# Patient Record
Sex: Male | Born: 1985 | Race: Black or African American | Hispanic: No | Marital: Single | Smoking: Current every day smoker
Health system: Southern US, Community
[De-identification: ages and names within clinical notes are randomized; demographics above are authoritative.]

## PROBLEM LIST (undated history)

## (undated) DIAGNOSIS — F419 Anxiety disorder, unspecified: Secondary | ICD-10-CM

## (undated) DIAGNOSIS — F1910 Other psychoactive substance abuse, uncomplicated: Secondary | ICD-10-CM

## (undated) DIAGNOSIS — M795 Residual foreign body in soft tissue: Secondary | ICD-10-CM

## (undated) DIAGNOSIS — F329 Major depressive disorder, single episode, unspecified: Secondary | ICD-10-CM

## (undated) DIAGNOSIS — W3400XA Accidental discharge from unspecified firearms or gun, initial encounter: Secondary | ICD-10-CM

## (undated) DIAGNOSIS — F1010 Alcohol abuse, uncomplicated: Secondary | ICD-10-CM

## (undated) DIAGNOSIS — I10 Essential (primary) hypertension: Secondary | ICD-10-CM

## (undated) DIAGNOSIS — F32A Depression, unspecified: Secondary | ICD-10-CM

---

## 2011-09-16 ENCOUNTER — Emergency Department (HOSPITAL_COMMUNITY)
Admission: EM | Admit: 2011-09-16 | Discharge: 2011-09-16 | Disposition: A | Discharge status: Home / Self Care | Payer: Self-pay | Attending: Emergency Medicine | Admitting: Emergency Medicine

## 2011-09-16 ENCOUNTER — Emergency Department (HOSPITAL_COMMUNITY): Payer: Self-pay

## 2011-09-16 ENCOUNTER — Encounter (HOSPITAL_COMMUNITY): Payer: Self-pay | Admitting: *Deleted

## 2011-09-16 DIAGNOSIS — M79609 Pain in unspecified limb: Secondary | ICD-10-CM | POA: Insufficient documentation

## 2011-09-16 DIAGNOSIS — R1012 Left upper quadrant pain: Secondary | ICD-10-CM | POA: Insufficient documentation

## 2011-09-16 DIAGNOSIS — M79643 Pain in unspecified hand: Secondary | ICD-10-CM

## 2011-09-16 DIAGNOSIS — F17200 Nicotine dependence, unspecified, uncomplicated: Secondary | ICD-10-CM | POA: Insufficient documentation

## 2011-09-16 NOTE — ED Provider Notes (Signed)
History     CSN: [id number]  Arrival date & time 09/16/11  1017   First MD Initiated Contact with Patient 09/16/11 1031      Chief Complaint  Patient presents with  . Hand Pain  . Abdominal Pain    (Consider location/radiation/quality/duration/timing/severity/associated sxs/prior treatment) Patient is a 26 y.o. male presenting with hand pain and abdominal pain. The history is provided by the patient.  Hand Pain Chronicity: start 4 weeks ago. The current episode started 1 to 4 weeks ago. The problem occurs intermittently. The problem has been rapidly improving. Associated symptoms include abdominal pain. Pertinent negatives include no anorexia, arthralgias, change in bowel habit, chest pain, congestion, coughing, fatigue, fever, headaches, nausea, neck pain, numbness, rash, sore throat, vomiting or weakness. Exacerbated by: drinking alcohol aggrevates his abdominal pain. He has tried nothing for the symptoms. The treatment provided moderate relief.  Abdominal Pain The primary symptoms of the illness include abdominal pain. The primary symptoms of the illness do not include fever, fatigue, shortness of breath, nausea, vomiting, diarrhea or dysuria.  Symptoms associated with the illness do not include anorexia, constipation, urgency, hematuria, frequency or back pain.    History reviewed. No pertinent past medical history.  Past Surgical History  Procedure Date  . Hand surgery     History reviewed. No pertinent family history.  History  Substance Use Topics  . Smoking status: Current Everyday Smoker  . Smokeless tobacco: Not on file  . Alcohol Use: Yes      Review of Systems  Constitutional: Negative for fever, activity change, appetite change and fatigue.  HENT: Negative for congestion, sore throat, facial swelling, rhinorrhea, trouble swallowing, neck pain, neck stiffness, voice change and sinus pressure.   Eyes: Negative.   Respiratory: Negative for cough, choking, chest  tightness, shortness of breath and wheezing.   Cardiovascular: Negative for chest pain.  Gastrointestinal: Positive for abdominal pain. Negative for nausea, vomiting, diarrhea, constipation, blood in stool, abdominal distention, anal bleeding, anorexia and change in bowel habit.  Genitourinary: Negative for dysuria, urgency, frequency, hematuria, flank pain and difficulty urinating.  Musculoskeletal: Negative for back pain, arthralgias and gait problem.  Skin: Negative for rash and wound.  Neurological: Negative for facial asymmetry, weakness, numbness and headaches.  Psychiatric/Behavioral: Negative for behavioral problems, confusion and agitation. The patient is not nervous/anxious and is not hyperactive.   All other systems reviewed and are negative.    Allergies  Review of patient's allergies indicates no known allergies.  Home Medications  No current outpatient prescriptions on file.  BP 139/65  Pulse 52  Temp 97.7 F (36.5 C) (Oral)  Resp 14  Ht 5\' 11"  (1.803 m)  Wt 190 lb (86.183 kg)  BMI 26.50 kg/m2  SpO2 96%  Physical Exam  Nursing note and vitals reviewed. Constitutional: He is oriented to person, place, and time. He appears well-developed and well-nourished. No distress.  HENT:  Head: Normocephalic and atraumatic.  Right Ear: External ear normal.  Left Ear: External ear normal.  Mouth/Throat: No oropharyngeal exudate.  Eyes: Conjunctivae and EOM are normal. Pupils are equal, round, and reactive to light. Right eye exhibits no discharge. Left eye exhibits no discharge.  Neck: Normal range of motion. Neck supple. No JVD present. No tracheal deviation present. No thyromegaly present.  Cardiovascular: Normal rate, regular rhythm, normal heart sounds and intact distal pulses.  Exam reveals no gallop and no friction rub.   No murmur heard. Pulmonary/Chest: Effort normal and breath sounds normal. No respiratory distress. He  has no wheezes. He exhibits no tenderness.    Abdominal: Soft. Bowel sounds are normal. He exhibits no distension. There is no tenderness. There is no rigidity, no rebound, no guarding and negative Murphy's sign.  Musculoskeletal: Normal range of motion. He exhibits no edema and no tenderness.       Right wrist: He exhibits swelling. He exhibits normal range of motion, no tenderness, no bony tenderness, no effusion and no crepitus.       Patient with a 4 cm well healed scar to the dorsum of the right hand consistent with surgery to alleviate an infection along the extensor mechanism. There is a small amounts of swelling underneath the scar without heat erythema pain tenderness crepitus or other signs of inflammation. No bony tenderness whatsoever. No signs of cellulitis or infection.  Lymphadenopathy:    He has no cervical adenopathy.  Neurological: He is alert and oriented to person, place, and time. No cranial nerve deficit.  Skin: Skin is warm and dry. No rash noted. He is not diaphoretic. No pallor.  Psychiatric: He has a normal mood and affect. His behavior is normal.    ED Course  Procedures (including critical care time)  Labs Reviewed - No data to display No results found.   No diagnosis found.    MDM  26 year old male patient presents with concerns about his right hand. Patient is having no new symptoms of pain swelling redness heat or loss of function in his right hand. Patient says that he punched a tree 4 weeks ago and after that subsequently had signs of infection of his extensor mechanism of his right hand and subsequently underwent surgery. Since surgery patient has done well but did not take his antibiotics and he is concerned that that means that he may still have an infection and that is why he is here today. Patient was not able to follow up with his surgeon as a Careers adviser is in [place]. Here patient has no signs of infection of his right hand as detailed above in the physical exam. Patient without fevers nausea  vomiting. I feel that there is no sign of infection in his surgery as instructed the patient followup with the surgeon for reevaluation. Patient also complaining of abdominal pain in his left upper quadrant but does not have abdominal pain now. Patient says he has pain when he drinks alcohol but then goes away when he does not drink alcohol. Patient not nausea vomiting no diarrhea tolerating by mouth's afebrile vital signs stable abdomen without peritonitis or appreciable pain on exam. 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  DG Hand Complete Right (Final result)   Result time:09/16/11 1114    Final result by Rad Results In Interface (09/16/11 11:14:08)    Narrative:   *RADIOLOGY REPORT*  Clinical Data: History of surgery for infection 3-4 weeks ago. Soft tissue swelling about the metacarpals.  RIGHT HAND - COMPLETE 3+ VIEW  Comparison: None.  Findings: There is some soft tissue swelling about the hand. No radiopaque foreign body or soft tissue gas collection is identified. No bony destructive change, fracture or dislocation.  IMPRESSION: Soft tissue swelling without underlying soft tissue gas collection. No evidence of osteomyelitis.  Original Report Authenticated By: Bernadene Bell. Maricela Curet, M.D.         Imaging of the right hand shows swelling consistent with healing process visualized on physical exam. Patient encouraged followup with her original surgeon for repeat evaluation and  to cease alcohol consumption.  Case discussed with Dr.Beaton       Sherryl Manges, MD 09/16/11 1344

## 2011-09-16 NOTE — ED Notes (Signed)
Patient reports he hit a tree with his right hand and had some swelling noted approx 2.5 mths ago.  Patient states he had gross amount of swelling and he ended up having surgery on his hand due to infection approx 3 weeks ago.  Patient reports he was unable to get his meds filled due to cost.  He has returned today due to having concerns for infection.  Patient has noted healed suture line.  He has some swelling noted.  Patient admits to heavy drinking.  He reports he had onset of left sided mid to lower abd pain after drinking and he also has had onset of diarrhea.  Patient is no apparent distress at this time.  He is concerned about his liver as well

## 2011-09-16 NOTE — ED Provider Notes (Signed)
I saw and evaluated the patient, reviewed the resident's note and I agree with the findings and plan.   .Face to face Exam:  General:  Awake HEENT:  Atraumatic Resp:  Normal effort Abd:  Nondistended Neuro:No focal weakness Lymph: No adenopathy   Nelia Shi, MD 09/16/11 2320

## 2012-02-19 ENCOUNTER — Encounter (HOSPITAL_COMMUNITY): Payer: Self-pay | Admitting: Emergency Medicine

## 2012-02-19 ENCOUNTER — Emergency Department (HOSPITAL_COMMUNITY)
Admission: EM | Admit: 2012-02-19 | Discharge: 2012-02-19 | Disposition: A | Discharge status: Home / Self Care | Payer: Self-pay | Attending: Emergency Medicine | Admitting: Emergency Medicine

## 2012-02-19 DIAGNOSIS — F329 Major depressive disorder, single episode, unspecified: Secondary | ICD-10-CM | POA: Insufficient documentation

## 2012-02-19 DIAGNOSIS — F1010 Alcohol abuse, uncomplicated: Secondary | ICD-10-CM | POA: Insufficient documentation

## 2012-02-19 DIAGNOSIS — F17200 Nicotine dependence, unspecified, uncomplicated: Secondary | ICD-10-CM | POA: Insufficient documentation

## 2012-02-19 DIAGNOSIS — Z5900 Homelessness unspecified: Secondary | ICD-10-CM | POA: Insufficient documentation

## 2012-02-19 DIAGNOSIS — F1910 Other psychoactive substance abuse, uncomplicated: Secondary | ICD-10-CM | POA: Insufficient documentation

## 2012-02-19 DIAGNOSIS — R10819 Abdominal tenderness, unspecified site: Secondary | ICD-10-CM | POA: Insufficient documentation

## 2012-02-19 DIAGNOSIS — F3289 Other specified depressive episodes: Secondary | ICD-10-CM | POA: Insufficient documentation

## 2012-02-19 DIAGNOSIS — R1084 Generalized abdominal pain: Secondary | ICD-10-CM | POA: Insufficient documentation

## 2012-02-19 LAB — RAPID URINE DRUG SCREEN, HOSP PERFORMED
Amphetamines: NOT DETECTED
Benzodiazepines: NOT DETECTED
Opiates: NOT DETECTED
Tetrahydrocannabinol: NOT DETECTED

## 2012-02-19 LAB — CBC
MCHC: 35 g/dL (ref 30.0–36.0)
MCV: 84.4 fL (ref 78.0–100.0)
Platelets: 245 10*3/uL (ref 150–400)
RDW: 13.1 % (ref 11.5–15.5)
WBC: 10.5 10*3/uL (ref 4.0–10.5)

## 2012-02-19 LAB — ETHANOL: Alcohol, Ethyl (B): 96 mg/dL — ABNORMAL HIGH (ref 0–11)

## 2012-02-19 LAB — COMPREHENSIVE METABOLIC PANEL
AST: 27 U/L (ref 0–37)
Albumin: 4 g/dL (ref 3.5–5.2)
BUN: 8 mg/dL (ref 6–23)
Chloride: 97 mEq/L (ref 96–112)
Creatinine, Ser: 0.76 mg/dL (ref 0.50–1.35)
Potassium: 3.3 mEq/L — ABNORMAL LOW (ref 3.5–5.1)
Total Bilirubin: 0.4 mg/dL (ref 0.3–1.2)
Total Protein: 8 g/dL (ref 6.0–8.3)

## 2012-02-19 MED ORDER — ZOLPIDEM TARTRATE 5 MG PO TABS: 5.0000 mg | ORAL_TABLET | Freq: Every evening | ORAL | Status: DC | PRN

## 2012-02-19 MED ORDER — ALUM & MAG HYDROXIDE-SIMETH 200-200-20 MG/5ML PO SUSP: 30.0000 mL | ORAL | Status: DC | PRN

## 2012-02-19 MED ORDER — LORAZEPAM 2 MG/ML IJ SOLN: 1.0000 mg | Freq: Four times a day (QID) | INTRAMUSCULAR | Status: DC | PRN

## 2012-02-19 MED ORDER — NICOTINE 21 MG/24HR TD PT24: 21.0000 mg | MEDICATED_PATCH | Freq: Every day | TRANSDERMAL | Status: DC

## 2012-02-19 MED ORDER — LORAZEPAM 1 MG PO TABS: 1.0000 mg | ORAL_TABLET | Freq: Four times a day (QID) | ORAL | Status: DC | PRN

## 2012-02-19 MED ORDER — THIAMINE HCL 100 MG/ML IJ SOLN: 100.0000 mg | Freq: Every day | INTRAMUSCULAR | Status: DC

## 2012-02-19 MED ORDER — ONDANSETRON HCL 4 MG PO TABS: 4.0000 mg | ORAL_TABLET | Freq: Three times a day (TID) | ORAL | Status: DC | PRN

## 2012-02-19 MED ORDER — ACETAMINOPHEN 325 MG PO TABS: 650.0000 mg | ORAL_TABLET | ORAL | Status: DC | PRN

## 2012-02-19 MED ORDER — FOLIC ACID 1 MG PO TABS: 1.0000 mg | ORAL_TABLET | Freq: Every day | ORAL | Status: DC

## 2012-02-19 MED ORDER — VITAMIN B-1 100 MG PO TABS: 100.0000 mg | ORAL_TABLET | Freq: Every day | ORAL | Status: DC

## 2012-02-19 MED ORDER — ADULT MULTIVITAMIN W/MINERALS CH: 1.0000 | ORAL_TABLET | Freq: Every day | ORAL | Status: DC

## 2012-02-19 MED ORDER — POTASSIUM CHLORIDE CRYS ER 20 MEQ PO TBCR
40.0000 meq | EXTENDED_RELEASE_TABLET | Freq: Once | ORAL | Status: AC
  Administered 2012-02-19: 40 meq via ORAL
  Filled 2012-02-19: qty 2

## 2012-02-19 MED ORDER — IBUPROFEN 600 MG PO TABS: 600.0000 mg | ORAL_TABLET | Freq: Three times a day (TID) | ORAL | Status: DC | PRN

## 2012-02-19 NOTE — ED Notes (Signed)
Dr. Miller @ bedside.

## 2012-02-19 NOTE — ED Notes (Signed)
Pt. and belongings wanded by security 

## 2012-02-19 NOTE — BH Assessment (Signed)
Assessment Note   Adam [name] is an 26 y.o. male. Patient presents to [facility] via EMS. Sts that he called EDS for assistance after experiencing "cramps and pains on the sides of his stomach". Pt arrived stating that he is from the [place] area, he came here because he has a girlfriend here that he moved in with. Patient stating that after moving in with his girlfriend they begin having conflicts b/c she told patient that he drinks too much and smokes cracks occasionally. Patient using $200 worth of cocaine 1-2x's per week; pt uses $200 worth of cocaine during each use; last use was 02/19/2012. Patient also binge drinking daily; drinks a large amt of Sparks, beers, and Locos; last drink was 02/18/2012. Patient sts that his alcohol and drug use yesterday was a suicide attempt. Sts that he was hoping to get alcohol poisoning and/or overdose on the crack cocaine. He denies prior history of self harm. His girlfriend doesn't drink or do any drugs and wants pt to get treatment for his addictions. Pt wants help with depression and substance abuse. No prior inpatient admissions for mental illness or substance abuse.  Sts that he is motivated to change and doesn't want to be "like his mother". Patients mother is a crack cocaine abuser. Patients depression is triggered by watching his mothers suffer from a  crack addictions. He also recently learned that his mother is in the hospital after having a stroke. Pt believes his mom got a hold of some bad crack with rat poison in it and that's what made her have a stroke. Both his parents have had strokes, his dad mini strokes and he's ok but his mom had a major stroke and is only walking at 40% and they've been told she'll never talk again.  Pt reports feeling increasingly angry. Says he was kicked out of the [employer] for anger problems years ago.  He said he tried Lexapro once but it made him feel crazy so he stopped taking it all together. He also said he really doesn't like to  take medicines but willing to try anything to help himself get better.     Axis I: Major Depressive Disorder, Single Episode, without Psychotic Features; Alcohol Dependence; Cocaine Abuse Axis II: Deferred Axis III: History reviewed. No pertinent past medical history. Axis IV: economic problems, housing problems, other psychosocial or environmental problems, problems related to social environment, problems with access to health care services and problems with primary support group Axis V: 31-40 impairment in reality testing  Past Medical History: History reviewed. No pertinent past medical history.  Past Surgical History  Procedure Date  . Hand surgery     Family History: No family history on file.  Social History:  reports that he has been smoking.  He does not have any smokeless tobacco history on file. He reports that he drinks alcohol. He reports that he uses illicit drugs (Cocaine).  Additional Social History:  Alcohol / Drug Use Pain Medications: SEE MAR Prescriptions: SEE MAR Over the Counter: SEE MAR History of alcohol / drug use?: Yes Longest period of sobriety (when/how long): 1 day or less Negative Consequences of Use: Personal relationships Substance #1 Name of Substance 1: Cocaine 1 - Age of First Use: 26 yrs old 1 - Amount (size/oz): $200 1 - Frequency: 1-2x's per week 1 - Duration: ongoing for the past 2-4 yrs 1 - Last Use / Amount: 02/18/12; $200 Substance #2 Name of Substance 2: Alcohol 2 - Age of First Use:  26 yrs old 2 - Amount (size/oz): (4-12) Sparks, ("6-7 or sometimes more")40oz beers, 3-4 Locos 2 - Frequency: daily  2 - Duration: since age 89 2 - Last Use / Amount: 02/19/2012; alcohool binges  CIWA: CIWA-Ar BP: 147/89 mmHg Pulse Rate: 90  COWS:    Allergies: No Known Allergies  Home Medications:  (Not in a hospital admission)  OB/GYN Status:  No LMP for male patient.  General Assessment Data Location of Assessment: [facility] Living  Arrangements: Other (Comment);Spouse/significant other ([place]/ [place] Admission Status: Voluntary Is patient capable of signing voluntary admission?: Yes Transfer from: Acute Hospital Referral Source: Self/Family/Friend  Education Status Is patient currently in school?: No  Risk to self Suicidal Ideation: Yes-Currently Present Suicidal Intent: Yes-Currently Present Is patient at risk for suicide?: Yes Suicidal Plan?: Yes-Currently Present Specify Current Suicidal Plan:  (drink self to death or overdose on crack) Access to Means: Yes Specify Access to Suicidal Means:  (alcohol and crack cocaine) What has been your use of drugs/alcohol within the last 12 months?:  (alcohol and crack cocaine) Previous Attempts/Gestures: No How many times?:  (0) Other Self Harm Risks:  (none reported) Triggers for Past Attempts:  (no previous attempts noted) Intentional Self Injurious Behavior: None Family Suicide History: See progress notes (patients mother has a history of crack cocaine addiction) Recent stressful life event(s): Other (Comment);Trauma (Comment);Conflict (Comment) (mother and father have health issues; px's w/ Gfriend, SA) Persecutory voices/beliefs?: No Depression: Yes Depression Symptoms: Feeling angry/irritable;Feeling worthless/self pity;Loss of interest in usual pleasures;Guilt;Fatigue Substance abuse history and/or treatment for substance abuse?: No Suicide prevention information given to non-admitted patients: Not applicable  Risk to Others Homicidal Ideation: No Thoughts of Harm to Others: No Current Homicidal Intent: No Current Homicidal Plan: No Access to Homicidal Means: No Identified Victim:  (N/A) History of harm to others?: No Assessment of Violence: None Noted Violent Behavior Description:  (patient is calm and cooperative) Does patient have access to weapons?: No Criminal Charges Pending?: No Does patient have a  court date: No  Psychosis Hallucinations: None noted Delusions: None noted  Mental Status Report Appear/Hygiene: Other (Comment) (apprpropriate) Eye Contact: Poor Motor Activity: Freedom of movement Speech: Logical/coherent Level of Consciousness: Alert Mood: Depressed;Sad Affect: Appropriate to circumstance Anxiety Level: None Thought Processes: Coherent Judgement: Impaired Orientation: Place;Person;Time;Situation Obsessive Compulsive Thoughts/Behaviors: None  Cognitive Functioning Concentration: Decreased Memory: Recent Intact;Remote Intact IQ: Average Insight: Good Impulse Control: Good Appetite: Poor Weight Loss:  ("I don't eat ) Weight Gain:  (0) Sleep: Decreased Total Hours of Sleep:  (4-5 hrs at a time) Vegetative Symptoms: None  ADLScreening [facility] Assessment Services) Patient's cognitive ability adequate to safely complete daily activities?: Yes Patient able to express need for assistance with ADLs?: Yes Independently performs ADLs?: Yes (appropriate for developmental age)  Abuse/Neglect [facility]) Physical Abuse: Denies Verbal Abuse: Denies Sexual Abuse: Denies  Prior Inpatient Therapy Prior Inpatient Therapy: No Prior Therapy Dates:  (n/a) Prior Therapy Facilty/Provider(s):  (n/a) Reason for Treatment:  (n/a)  Prior Outpatient Therapy Prior Outpatient Therapy: No Prior Therapy Dates:  (n/a) Prior Therapy Facilty/Provider(s):  (n/a) Reason for Treatment:  (n/a)  ADL Screening (condition at time of admission) Patient's cognitive ability adequate to safely complete daily activities?: Yes Patient able to express need for assistance with ADLs?: Yes Independently performs ADLs?: Yes (appropriate for developmental age) Weakness of Legs: None Weakness of Arms/Hands: None  Home Assistive Devices/Equipment Home Assistive Devices/Equipment: None    Abuse/Neglect Assessment (Assessment  to be complete while patient is alone) Physical Abuse: Denies Verbal  Abuse: Denies Sexual Abuse: Denies Exploitation of patient/patient's resources: Denies Self-Neglect: Denies Values / Beliefs Cultural Requests During Hospitalization: None Spiritual Requests During Hospitalization: None   Advance Directives (For Healthcare) Advance Directive: Patient does not have advance directive Nutrition Screen- [facility] Adult/[facility] Patient's home diet: Regular  Additional Information 1:1 In Past 12 Months?: No CIRT Risk: No Elopement Risk: No Does patient have medical clearance?: No     Disposition:  Disposition Disposition of Patient: Inpatient treatment program  On Site Evaluation by:   Reviewed with Physician:     Octaviano Batty 02/19/2012 10:09 AM

## 2012-02-19 NOTE — ED Provider Notes (Addendum)
8:06 AM Filed Vitals:   02/19/12 0635  BP:   Pulse: 90  Temp: 97.7 F (36.5 C)  Resp: 20    Vague SI. ETOH abuse. Likely depression. Will ask telepsych to evaluate  Lyanne Co, MD 02/19/12 0807  10:55 AM The psychiatrist, Dr Rob Bunting MD, evaluated the patient lives the patient is safe for discharge home.  He recommends outpatient referrals to substance abuse specialist.  The patient is calm and cooperative.  No suicidal thoughts at this time.  Discharge home in good condition   Lyanne Co, MD 02/19/12 1056

## 2012-02-19 NOTE — ED Notes (Signed)
Pt is from the [place] area, he came here because he has a girlfriend here that he moved in with which he says they love eachother and he wants to marry her but he drinks too much and smokes cracks occasionally. His girlfriend doesn't drink or do any drugs and wants pt to get treatment for his addictions. Pt wants help with depression and substance abuse. He said he got kicked out of the [employer] for anger problems years ago. Both his parents have had strokes, his dad mini strokes and he's ok but his mom had a major stroke and is only walking at 40% and they've been told she'll never talk again. Pt believes mom got a hold of some bad crack with rat poison in it and that's what made her have a stroke. He says mom smoked crack for years including when she had her children. He said he tried Lexapro once but it made him feel crazy so he stopped taking it all together. He also said he really doesn't like to take medication but to depend on God. Explained to pt there would be a combination of talk therapy and drug therapy and he may not be on the anti-depressants for the rest of his life. Oriented to the unit.

## 2012-02-19 NOTE — ED Notes (Signed)
Pt alert, arrives from homeless, pt visiting from [place], pt states "drinking all day", presents c/o gen abd pain, pt ambulates to triage, resp even unlabored, skin pwd

## 2012-02-19 NOTE — ED Provider Notes (Signed)
History     CSN: [id number]  Arrival date & time 02/19/12  0406   First MD Initiated Contact with Patient 02/19/12 0550      Chief Complaint  Patient presents with  . Medical Clearance    (Consider location/radiation/quality/duration/timing/severity/associated sxs/prior treatment) HPI Comments: 26 year old male with a history of alcohol abuse who presents with the complaint of suicidal thoughts in excessive alcohol use. He states that over the last 6 months he has seen his mother have a massive stroke, his father's had 2 small strokes and he is having significant difficulties with his relationship with his girlfriend. He sites these events as the reasons that he is feeling suicidal. He notes that over the last few days he has been drinking alcohol so heavily that he has hardly had anything to eat and he has been drinking alcohol constantly including liquor and beer beverages, he is also endorses using crack several times over the last week. He denies any other drugs of abuse. He does note that he has mild tenderness to his right upper quadrant and left upper quadrant when he drinks heavily and feels that way at this time. He denies having tried to hurt himself in the past but feels like he has been drinking 2 "try to kill myself".  He denies hallucinations. He states that the symptoms of depression up and gradually worsening, persistent, nothing seems to make it better or worse. He denies being in psychiatric hospitals in the past but he does note that he has been in detox facilities.  The history is provided by the patient.    History reviewed. No pertinent past medical history.  Past Surgical History  Procedure Date  . Hand surgery     No family history on file.  History  Substance Use Topics  . Smoking status: Current Every Day Smoker  . Smokeless tobacco: Not on file  . Alcohol Use: Yes      Review of Systems  All other systems reviewed and are negative.    Allergies   Review of patient's allergies indicates no known allergies.  Home Medications  No current outpatient prescriptions on file.  BP 147/89  Pulse 101  Resp 19  SpO2 100%  Physical Exam  Nursing note and vitals reviewed. Constitutional: He appears well-developed and well-nourished. No distress.  HENT:  Head: Normocephalic and atraumatic.  Mouth/Throat: Oropharynx is clear and moist. No oropharyngeal exudate.  Eyes: Conjunctivae normal and EOM are normal. Pupils are equal, round, and reactive to light. Right eye exhibits no discharge. Left eye exhibits no discharge. No scleral icterus.  Neck: Normal range of motion. Neck supple. No JVD present. No thyromegaly present.  Cardiovascular: Normal rate, regular rhythm, normal heart sounds and intact distal pulses.  Exam reveals no gallop and no friction rub.   No murmur heard. Pulmonary/Chest: Effort normal and breath sounds normal. No respiratory distress. He has no wheezes. He has no rales.  Abdominal: Soft. Bowel sounds are normal. He exhibits no distension and no mass. There is no tenderness.       No reproducible tenderness to palpation and no hepatosplenomegaly  Musculoskeletal: Normal range of motion. He exhibits no edema and no tenderness.  Lymphadenopathy:    He has no cervical adenopathy.  Neurological: He is alert. Coordination normal.  Skin: Skin is warm and dry. No rash noted. No erythema.  Psychiatric:       Mildly depressed, no signs of significant anxiety, no hallucinations, passive suicidal thoughts.    ED  Course  Procedures (including critical care time)  Labs Reviewed  COMPREHENSIVE METABOLIC PANEL - Abnormal; Notable for the following:    Potassium 3.3 (*)     All other components within normal limits  [name] - Abnormal; Notable for the following:    Alcohol, Ethyl (B) 96 (*)     All other components within normal limits  CBC  URINE RAPID DRUG SCREEN (HOSP PERFORMED)   No results found.   1. Substance abuse    2. Suicidal ideation       MDM  The patient is depressed and has been drinking excessively to the end of self-harm, his alcohol level is less than 100 but given this pattern of significant substance abuse he would benefit from inpatient evaluation and treatment of his depression and substance abuse. He does not have a physical findings concerning for any concerning abnormalities, his liver function is normal, blood counts are normal, potassium is mildly low, this will be replaced with oral potassium. There is no behavioral health assessment team at this time, we will consult after 7 AM.  Change of shift - care signed out to oncoming physician.        Vida Roller, MD 02/19/12 [phone]

## 2012-02-19 NOTE — ED Notes (Signed)
ACT in w/ pt 

## 2012-02-19 NOTE — ED Notes (Signed)
Pt calls this nurse into room, states "i drink to try and kill myself"

## 2012-05-14 ENCOUNTER — Emergency Department (HOSPITAL_COMMUNITY)
Admission: EM | Admit: 2012-05-14 | Discharge: 2012-05-14 | Disposition: A | Discharge status: Home / Self Care | Payer: Self-pay | Attending: Emergency Medicine | Admitting: Emergency Medicine

## 2012-05-14 ENCOUNTER — Encounter (HOSPITAL_COMMUNITY): Payer: Self-pay | Admitting: Emergency Medicine

## 2012-05-14 DIAGNOSIS — B353 Tinea pedis: Secondary | ICD-10-CM | POA: Insufficient documentation

## 2012-05-14 DIAGNOSIS — L299 Pruritus, unspecified: Secondary | ICD-10-CM | POA: Insufficient documentation

## 2012-05-14 DIAGNOSIS — F17200 Nicotine dependence, unspecified, uncomplicated: Secondary | ICD-10-CM | POA: Insufficient documentation

## 2012-05-14 MED ORDER — CLOTRIMAZOLE 1 % EX CREA: TOPICAL_CREAM | Freq: Two times a day (BID) | CUTANEOUS | Status: DC

## 2012-05-14 NOTE — ED Provider Notes (Signed)
History     CSN: [id number]  Arrival date & time 05/14/12  1228   First MD Initiated Contact with Patient 05/14/12 1337      Chief Complaint  Patient presents with  . Toe Pain    (Consider location/radiation/quality/duration/timing/severity/associated sxs/prior treatment) HPI Comments: Patient reports that he has had pain in between his 4th and 5th toes on both the right and the left foot for the past 8-9 months, which is gradually worsening.  He describes the pain as a burning pain and also reports itching.  He recently got out of jail.  He reports that the area is moist and that he has noticed a whitish color discharge in between his toes.  He has not had any treatment prior to arrival in the ED.  He denies numbness or tingling.  Denies fever or chills.  No erythema or swelling of the toes.  Patient is a 27 y.o. male presenting with toe pain. The history is provided by the patient.  Toe Pain Pertinent negatives include no chills or fever.    History reviewed. No pertinent past medical history.  Past Surgical History  Procedure Laterality Date  . Hand surgery      No family history on file.  History  Substance Use Topics  . Smoking status: Current Every Day Smoker  . Smokeless tobacco: Not on file  . Alcohol Use: Yes      Review of Systems  Constitutional: Negative for fever and chills.  Skin:       Discharge between toes on right and left foot    Allergies  Review of patient's allergies indicates no known allergies.  Home Medications  No current outpatient prescriptions on file.  BP 136/88  Pulse 85  Temp(Src) 97.6 F (36.4 C) (Oral)  Resp 18  SpO2 96%  Physical Exam  Nursing note and vitals reviewed. Constitutional: He appears well-developed and well-nourished. No distress.  HENT:  Head: Normocephalic and atraumatic.  Mouth/Throat: Oropharynx is clear and moist.  Neck: Normal range of motion. Neck supple.  Cardiovascular: Normal rate, regular rhythm  and normal heart sounds.   Pulmonary/Chest: Effort normal and breath sounds normal.  Neurological: He is alert. No sensory deficit.  Skin: Skin is warm and dry. He is not diaphoretic.  Moist whitish colored discharge in between the 4th and 5th toes on both the right and the left foot.  Skin appears to be peeling.   No erythema or swelling of the toes.  Psychiatric: He has a normal mood and affect.    ED Course  Procedures (including critical care time)  Labs Reviewed - No data to display No results found.   No diagnosis found.    MDM  Patient with Tinea Pedis infection of both the left and right foot.  Patient given prescription for antifungal and discharged home.        Pascal Lux [place], PA-C 05/15/12 1759

## 2012-05-14 NOTE — ED Notes (Signed)
Pt c/o pain between pinky toe and 4th toe noted today. Pt reports usually area has white color for 5-6 months to it but today noticed the green color.

## 2012-05-16 ENCOUNTER — Emergency Department (HOSPITAL_COMMUNITY)
Admission: EM | Admit: 2012-05-16 | Discharge: 2012-05-16 | Disposition: A | Discharge status: Home / Self Care | Payer: Self-pay | Attending: Emergency Medicine | Admitting: Emergency Medicine

## 2012-05-16 ENCOUNTER — Encounter (HOSPITAL_COMMUNITY): Payer: Self-pay | Admitting: Cardiology

## 2012-05-16 DIAGNOSIS — K2970 Gastritis, unspecified, without bleeding: Secondary | ICD-10-CM | POA: Insufficient documentation

## 2012-05-16 DIAGNOSIS — R1011 Right upper quadrant pain: Secondary | ICD-10-CM | POA: Insufficient documentation

## 2012-05-16 DIAGNOSIS — F17200 Nicotine dependence, unspecified, uncomplicated: Secondary | ICD-10-CM | POA: Insufficient documentation

## 2012-05-16 DIAGNOSIS — R1110 Vomiting, unspecified: Secondary | ICD-10-CM | POA: Insufficient documentation

## 2012-05-16 LAB — COMPREHENSIVE METABOLIC PANEL
ALT: 27 U/L (ref 0–53)
AST: 30 U/L (ref 0–37)
Albumin: 3.3 g/dL — ABNORMAL LOW (ref 3.5–5.2)
CO2: 23 mEq/L (ref 19–32)
Calcium: 9 mg/dL (ref 8.4–10.5)
Chloride: 101 mEq/L (ref 96–112)
Creatinine, Ser: 0.61 mg/dL (ref 0.50–1.35)
Sodium: 138 mEq/L (ref 135–145)

## 2012-05-16 LAB — CBC WITH DIFFERENTIAL/PLATELET
Basophils Absolute: 0 10*3/uL (ref 0.0–0.1)
Basophils Relative: 0 % (ref 0–1)
Eosinophils Relative: 1 % (ref 0–5)
HCT: 45.9 % (ref 39.0–52.0)
Lymphocytes Relative: 22 % (ref 12–46)
MCHC: 35.5 g/dL (ref 30.0–36.0)
Monocytes Absolute: 1 10*3/uL (ref 0.1–1.0)
Neutro Abs: 6.9 10*3/uL (ref 1.7–7.7)
Platelets: 245 10*3/uL (ref 150–400)
RDW: 14.9 % (ref 11.5–15.5)
WBC: 10.3 10*3/uL (ref 4.0–10.5)

## 2012-05-16 LAB — OCCULT BLOOD, POC DEVICE: Fecal Occult Bld: NEGATIVE

## 2012-05-16 MED ORDER — OMEPRAZOLE 20 MG PO CPDR: 20.0000 mg | DELAYED_RELEASE_CAPSULE | Freq: Every day | ORAL | Status: DC

## 2012-05-16 MED ORDER — MORPHINE SULFATE 4 MG/ML IJ SOLN
4.0000 mg | Freq: Once | INTRAMUSCULAR | Status: AC
  Administered 2012-05-16: 4 mg via INTRAVENOUS
  Filled 2012-05-16: qty 1

## 2012-05-16 MED ORDER — FAMOTIDINE IN NACL 20-0.9 MG/50ML-% IV SOLN
20.0000 mg | Freq: Once | INTRAVENOUS | Status: AC
  Administered 2012-05-16: 20 mg via INTRAVENOUS
  Filled 2012-05-16: qty 50

## 2012-05-16 MED ORDER — SODIUM CHLORIDE 0.9 % IV BOLUS (SEPSIS)
1000.0000 mL | Freq: Once | INTRAVENOUS | Status: AC
  Administered 2012-05-16: 1000 mL via INTRAVENOUS

## 2012-05-16 MED ORDER — VITAMIN B-1 100 MG PO TABS
50.0000 mg | ORAL_TABLET | Freq: Once | ORAL | Status: AC
  Administered 2012-05-16: 50 mg via ORAL
  Filled 2012-05-16: qty 1
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
## 2012-05-16 NOTE — ED Provider Notes (Signed)
Medical screening examination/treatment/procedure(s) were performed by non-physician practitioner and as supervising physician I was immediately available for consultation/collaboration.   Laray Anger, DO 05/16/12 1235

## 2012-05-16 NOTE — ED Provider Notes (Signed)
History     CSN: [id number]  Arrival date & time 05/16/12  0902   First MD Initiated Contact with Patient 05/16/12 [phone]      Chief Complaint  Patient presents with  . Back Pain    (Consider location/radiation/quality/duration/timing/severity/associated sxs/prior treatment) HPI Comments: Pt comes in with cc of abd pain and back pain. He admits to daily drinking. States that he has been drinking heavily over the past few days as he was kicked out by his wife. Pt started having abd pain - RLQ around 5 am along with upper lumbar spine pain. He has had similar pain with drinking before, so he stopped drinking, and proceeded to come to the ED. He has no nausea, but admits to 1 episode of emesis, blood tinged. No hx of liver dz, no bloody stools. No EGD done at any point. Pt has no associated numbness, weakness, urinary incontinence, urinary retention, bowel incontinence, saddle anesthesia. No hx of gall stone, renal stones.   Patient is a 27 y.o. male presenting with back pain. The history is provided by the patient.  Back Pain Associated symptoms: abdominal pain   Associated symptoms: no chest pain, no dysuria, no fever and no headaches     History reviewed. No pertinent past medical history.  Past Surgical History  Procedure Laterality Date  . Hand surgery      History reviewed. No pertinent family history.  History  Substance Use Topics  . Smoking status: Current Every Day Smoker  . Smokeless tobacco: Not on file  . Alcohol Use: Yes      Review of Systems  Constitutional: Negative for fever, chills and activity change.  HENT: Negative for neck pain.   Eyes: Negative for visual disturbance.  Respiratory: Negative for cough, chest tightness and shortness of breath.   Cardiovascular: Negative for chest pain.  Gastrointestinal: Positive for vomiting and abdominal pain. Negative for nausea, diarrhea, constipation, blood in stool and abdominal distention.  Genitourinary:  Negative for dysuria, enuresis and difficulty urinating.  Musculoskeletal: Positive for back pain. Negative for arthralgias.  Neurological: Negative for dizziness, light-headedness and headaches.  Psychiatric/Behavioral: Negative for confusion.    Allergies  Review of patient's allergies indicates no known allergies.  Home Medications   Current Outpatient Rx  Name  Route  Sig  Dispense  Refill  . clotrimazole (LOTRIMIN AF) 1 % cream   Topical   Apply topically 2 (two) times daily.   30 g   0     BP 115/72  Pulse 92  Temp(Src) 97.7 F (36.5 C) (Oral)  Resp 18  SpO2 98%  Physical Exam  Nursing note and vitals reviewed. Constitutional: He is oriented to person, place, and time. He appears well-developed.  HENT:  Head: Normocephalic and atraumatic.  Eyes: Conjunctivae and EOM are normal. Pupils are equal, round, and reactive to light.  Neck: Normal range of motion. Neck supple.  Cardiovascular: Normal rate, regular rhythm and normal heart sounds.   Pulmonary/Chest: Effort normal and breath sounds normal. No respiratory distress. He has no wheezes.  Abdominal: Soft. Bowel sounds are normal. He exhibits no distension. There is no tenderness. There is no rebound and no guarding.  guaiac negative stools  Musculoskeletal:  Pt has tenderness over the upper lumbar region No step offs, no erythema. Pt has 2+ patellar reflex bilaterally. Able to discriminate between sharp and dull. Able to ambulate  Neurological: He is alert and oriented to person, place, and time.  Skin: Skin is warm.  ED Course  Procedures (including critical care time)  Labs Reviewed  CBC WITH DIFFERENTIAL  COMPREHENSIVE METABOLIC PANEL  LIPASE, BLOOD  OCCULT BLOOD, POC DEVICE   No results found.   No diagnosis found.    MDM  DDx includes: Pancreatitis Hepatobiliary pathology including cholecystitis Gastritis/PUD Aortic Dissection   Pt comes in with cc of abd pain. Pt also has some  back pain. The pais is RUQ - mostly lateral, no murphy's. Very low probability of gall stones. No CVA tenderness - and concerns for renal stones really low. DDx is mostly PUD vs. Pancreatitis. We will get basic screening labs to start. Guaiac neg stools - will get screening cbc.  Derwood Kaplan, MD 05/16/12 1019

## 2012-05-16 NOTE — ED Notes (Signed)
Pt to department via EMS- pt reports he has been drinking last night, approximately 9-10 40's. States he is concerned that he has alcohol poisoning. Pt also reports toe pain that he received a Rx for but did not have it filled.

## 2012-05-31 ENCOUNTER — Encounter (HOSPITAL_COMMUNITY): Payer: Self-pay | Admitting: *Deleted

## 2012-05-31 ENCOUNTER — Emergency Department (HOSPITAL_COMMUNITY)
Admission: EM | Admit: 2012-05-31 | Discharge: 2012-05-31 | Disposition: A | Discharge status: Other Acute Inpatient Hospital | Payer: Self-pay | Attending: Emergency Medicine | Admitting: Emergency Medicine

## 2012-05-31 ENCOUNTER — Encounter (HOSPITAL_COMMUNITY): Payer: Self-pay | Admitting: Emergency Medicine

## 2012-05-31 ENCOUNTER — Inpatient Hospital Stay (HOSPITAL_COMMUNITY)
Admission: AD | Admit: 2012-05-31 | Discharge: 2012-06-02 | DRG: 897 | Disposition: A | Discharge status: Home / Self Care | Payer: Federal, State, Local not specified - Other | Source: Intra-hospital | Attending: Psychiatry | Admitting: Psychiatry

## 2012-05-31 DIAGNOSIS — F1410 Cocaine abuse, uncomplicated: Secondary | ICD-10-CM | POA: Diagnosis present

## 2012-05-31 DIAGNOSIS — Z79899 Other long term (current) drug therapy: Secondary | ICD-10-CM

## 2012-05-31 DIAGNOSIS — F1010 Alcohol abuse, uncomplicated: Secondary | ICD-10-CM | POA: Insufficient documentation

## 2012-05-31 DIAGNOSIS — F1020 Alcohol dependence, uncomplicated: Principal | ICD-10-CM | POA: Diagnosis present

## 2012-05-31 DIAGNOSIS — M79609 Pain in unspecified limb: Secondary | ICD-10-CM | POA: Insufficient documentation

## 2012-05-31 DIAGNOSIS — Z591 Inadequate housing, unspecified: Secondary | ICD-10-CM | POA: Insufficient documentation

## 2012-05-31 DIAGNOSIS — K219 Gastro-esophageal reflux disease without esophagitis: Secondary | ICD-10-CM | POA: Diagnosis present

## 2012-05-31 DIAGNOSIS — F1994 Other psychoactive substance use, unspecified with psychoactive substance-induced mood disorder: Secondary | ICD-10-CM | POA: Diagnosis present

## 2012-05-31 DIAGNOSIS — G8929 Other chronic pain: Secondary | ICD-10-CM | POA: Insufficient documentation

## 2012-05-31 DIAGNOSIS — M549 Dorsalgia, unspecified: Secondary | ICD-10-CM | POA: Insufficient documentation

## 2012-05-31 DIAGNOSIS — F1910 Other psychoactive substance abuse, uncomplicated: Secondary | ICD-10-CM | POA: Insufficient documentation

## 2012-05-31 DIAGNOSIS — R45851 Suicidal ideations: Secondary | ICD-10-CM | POA: Insufficient documentation

## 2012-05-31 DIAGNOSIS — F17200 Nicotine dependence, unspecified, uncomplicated: Secondary | ICD-10-CM | POA: Insufficient documentation

## 2012-05-31 DIAGNOSIS — F3289 Other specified depressive episodes: Secondary | ICD-10-CM | POA: Insufficient documentation

## 2012-05-31 DIAGNOSIS — Z8679 Personal history of other diseases of the circulatory system: Secondary | ICD-10-CM | POA: Insufficient documentation

## 2012-05-31 DIAGNOSIS — F329 Major depressive disorder, single episode, unspecified: Secondary | ICD-10-CM | POA: Insufficient documentation

## 2012-05-31 LAB — COMPREHENSIVE METABOLIC PANEL
AST: 18 U/L (ref 0–37)
BUN: 7 mg/dL (ref 6–23)
CO2: 23 mEq/L (ref 19–32)
Chloride: 99 mEq/L (ref 96–112)
Creatinine, Ser: 0.75 mg/dL (ref 0.50–1.35)
GFR calc Af Amer: >90 mL/min (ref >90)
GFR calc non Af Amer: >90 mL/min (ref >90)
Glucose, Bld: 92 mg/dL (ref 70–99)
Total Bilirubin: 0.4 mg/dL (ref 0.3–1.2)

## 2012-05-31 LAB — CBC WITH DIFFERENTIAL/PLATELET
Basophils Absolute: 0 10*3/uL (ref 0.0–0.1)
Eosinophils Relative: 1 % (ref 0–5)
HCT: 45.7 % (ref 39.0–52.0)
Hemoglobin: 16 g/dL (ref 13.0–17.0)
Lymphocytes Relative: 20 % (ref 12–46)
Lymphs Abs: 1.9 10*3/uL (ref 0.7–4.0)
MCV: 86.7 fL (ref 78.0–100.0)
Monocytes Absolute: 0.9 10*3/uL (ref 0.1–1.0)
Monocytes Relative: 10 % (ref 3–12)
Neutro Abs: 6.4 10*3/uL (ref 1.7–7.7)
RBC: 5.27 MIL/uL (ref 4.22–5.81)
WBC: 9.2 10*3/uL (ref 4.0–10.5)

## 2012-05-31 LAB — ACETAMINOPHEN LEVEL: Acetaminophen (Tylenol), Serum: <15 ug/mL (ref 10–30)

## 2012-05-31 LAB — GLUCOSE, CAPILLARY: Glucose-Capillary: 96 mg/dL (ref 70–99)

## 2012-05-31 LAB — RAPID URINE DRUG SCREEN, HOSP PERFORMED
Barbiturates: NOT DETECTED
Benzodiazepines: NOT DETECTED

## 2012-05-31 MED ORDER — VITAMIN B-1 100 MG PO TABS
100.0000 mg | ORAL_TABLET | Freq: Every day | ORAL | Status: DC
  Administered 2012-06-01 – 2012-06-02 (×2): 100 mg via ORAL
  Filled 2012-05-31 (×3): qty 1

## 2012-05-31 MED ORDER — HYDROXYZINE HCL 25 MG PO TABS: 25.0000 mg | ORAL_TABLET | Freq: Four times a day (QID) | ORAL | Status: DC | PRN

## 2012-05-31 MED ORDER — ONDANSETRON 4 MG PO TBDP: 4.0000 mg | ORAL_TABLET | Freq: Four times a day (QID) | ORAL | Status: DC | PRN

## 2012-05-31 MED ORDER — INFLUENZA VIRUS VACC SPLIT PF IM SUSP
0.5000 mL | INTRAMUSCULAR | Status: AC
  Administered 2012-06-01: 0.5 mL via INTRAMUSCULAR

## 2012-05-31 MED ORDER — MAGNESIUM HYDROXIDE 400 MG/5ML PO SUSP
30.0000 mL | Freq: Every day | ORAL | Status: DC | PRN
Start: 2012-05-31 — End: 2012-06-02

## 2012-05-31 MED ORDER — NICOTINE 14 MG/24HR TD PT24
14.0000 mg | MEDICATED_PATCH | Freq: Every day | TRANSDERMAL | Status: DC
  Filled 2012-05-31 (×2): qty 1

## 2012-05-31 MED ORDER — PNEUMOCOCCAL VAC POLYVALENT 25 MCG/0.5ML IJ INJ
0.5000 mL | INJECTION | INTRAMUSCULAR | Status: AC
  Administered 2012-06-01: 0.5 mL via INTRAMUSCULAR

## 2012-05-31 MED ORDER — IBUPROFEN 200 MG PO TABS: 600.0000 mg | ORAL_TABLET | Freq: Three times a day (TID) | ORAL | Status: DC | PRN

## 2012-05-31 MED ORDER — ACETAMINOPHEN 325 MG PO TABS: 650.0000 mg | ORAL_TABLET | ORAL | Status: DC | PRN

## 2012-05-31 MED ORDER — CHLORDIAZEPOXIDE HCL 25 MG PO CAPS: 25.0000 mg | ORAL_CAPSULE | Freq: Four times a day (QID) | ORAL | Status: DC | PRN

## 2012-05-31 MED ORDER — ALUM & MAG HYDROXIDE-SIMETH 200-200-20 MG/5ML PO SUSP: 30.0000 mL | ORAL | Status: DC | PRN

## 2012-05-31 MED ORDER — CHLORDIAZEPOXIDE HCL 25 MG PO CAPS
25.0000 mg | ORAL_CAPSULE | Freq: Four times a day (QID) | ORAL | Status: AC
  Administered 2012-05-31 – 2012-06-02 (×6): 25 mg via ORAL
  Filled 2012-05-31 (×6): qty 1

## 2012-05-31 MED ORDER — CHLORDIAZEPOXIDE HCL 25 MG PO CAPS
25.0000 mg | ORAL_CAPSULE | Freq: Three times a day (TID) | ORAL | Status: DC
  Filled 2012-05-31: qty 1

## 2012-05-31 MED ORDER — NICOTINE 21 MG/24HR TD PT24: 21.0000 mg | MEDICATED_PATCH | Freq: Every day | TRANSDERMAL | Status: DC

## 2012-05-31 MED ORDER — PANTOPRAZOLE SODIUM 40 MG PO TBEC
40.0000 mg | DELAYED_RELEASE_TABLET | Freq: Every day | ORAL | Status: DC
  Administered 2012-06-01 – 2012-06-02 (×2): 40 mg via ORAL
  Filled 2012-05-31 (×4): qty 1

## 2012-05-31 MED ORDER — TRAZODONE HCL 50 MG PO TABS
50.0000 mg | ORAL_TABLET | Freq: Every evening | ORAL | Status: DC | PRN
  Administered 2012-05-31 – 2012-06-01 (×2): 50 mg via ORAL
  Filled 2012-05-31 (×6): qty 1
  Filled 2012-05-31: qty 28
  Filled 2012-05-31: qty 1
  Filled 2012-05-31: qty 28

## 2012-05-31 MED ORDER — ACETAMINOPHEN 325 MG PO TABS: 650.0000 mg | ORAL_TABLET | Freq: Four times a day (QID) | ORAL | Status: DC | PRN

## 2012-05-31 MED ORDER — ADULT MULTIVITAMIN W/MINERALS CH
1.0000 | ORAL_TABLET | Freq: Every day | ORAL | Status: DC
  Administered 2012-06-01 – 2012-06-02 (×2): 1 via ORAL
  Filled 2012-05-31 (×3): qty 1

## 2012-05-31 MED ORDER — CHLORDIAZEPOXIDE HCL 25 MG PO CAPS: 25.0000 mg | ORAL_CAPSULE | Freq: Every day | ORAL | Status: DC

## 2012-05-31 MED ORDER — CHLORDIAZEPOXIDE HCL 25 MG PO CAPS: 25.0000 mg | ORAL_CAPSULE | ORAL | Status: DC

## 2012-05-31 MED ORDER — THIAMINE HCL 100 MG/ML IJ SOLN: 100.0000 mg | Freq: Once | INTRAMUSCULAR | Status: DC

## 2012-05-31 MED ORDER — LOPERAMIDE HCL 2 MG PO CAPS: 2.0000 mg | ORAL_CAPSULE | ORAL | Status: DC | PRN

## 2012-05-31 NOTE — ED Provider Notes (Signed)
Pt was assessed by [facility].  Pt states he is sucidal now and cannot contract for safety/  Will attempt to find inpatient treatment.  Celene Kras, MD 05/31/12 [phone]

## 2012-05-31 NOTE — ED Notes (Signed)
Called to follow up on tele psych assessment delay. Was told that the doctor was working on his case so between 11min-1hr, she will call me back with precise timeframe.

## 2012-05-31 NOTE — ED Notes (Signed)
Bed:WA16<BR> Expected date:<BR> Expected time:<BR> Means of arrival:<BR> Comments:<BR> overdose

## 2012-05-31 NOTE — Progress Notes (Signed)
Pt states he is suicidal and was suicidal last night, with intentions of suicide from drinking excessive amounts of alcohol and using crack. Pt requesting inpatient psychiatric treatment. CSW discussed with EDP, full bhh assessment to follow. Per EDP, wishing to cancel telepsych and have on site psychiatrist to follow patient tomorrow.   Catha Gosselin, LCSWA  [phone] .05/31/2012 1859pm

## 2012-05-31 NOTE — Tx Team (Signed)
Initial Interdisciplinary Treatment Plan  PATIENT STRENGTHS: (choose at least two) Ability for insight Active sense of humor Average or above average intelligence Communication skills General fund of knowledge Motivation for treatment/growth Physical Health Religious Affiliation Supportive family/friends Work skills  PATIENT STRESSORS: Substance abuse Relationship problems    PROBLEM LIST: Problem List/Patient Goals Date to be addressed Date deferred Reason deferred Estimated date of resolution  "Staying here til I feel like I ain't gonna hurt myself when I get out" 05/31/12           "[name] can help me with a plan to stay on track. Get in a program where I can get myself together 05/31/12           Substance abuse 05/31/12     depression 05/31/12     Suicidal thoughts 05/31/12                  DISCHARGE CRITERIA:  Ability to meet basic life and health needs Adequate post-discharge living arrangements Improved stabilization in mood, thinking, and/or behavior Medical problems require only outpatient monitoring Motivation to continue treatment in a less acute level of care Need for constant or close observation no longer present Reduction of life-threatening or endangering symptoms to within safe limits Safe-care adequate arrangements made Verbal commitment to aftercare and medication compliance Withdrawal symptoms are absent or subacute and managed without 24-hour nursing intervention  PRELIMINARY DISCHARGE PLAN: Attend 12-step recovery group Outpatient therapy Placement in alternative living arrangements  PATIENT/FAMIILY INVOLVEMENT: This treatment plan has been presented to and reviewed with the patient, Adam [name], and/or family member.  The patient and family have been given the opportunity to ask questions and make suggestions.  Fransico Michael Cook [facility] 05/31/2012, 9:41 PM

## 2012-05-31 NOTE — ED Notes (Addendum)
Pt to transport to [facility], 302-1 under Dr. Dub Mikes.  SW having pt sign paperwork at this time

## 2012-05-31 NOTE — Progress Notes (Signed)
CM spoke with pt who confirms self pay [facility] resident with no pcp. CM discussed written information for self pay pcps, importance of pcp for f/u care, www.needymeds.org, discounted pharmacies, MATCH program and other [place] resources such as financial assistance, DSS and  health department Reviewed Health connect number to assist with finding self pay provider close to pt's residence. Reviewed resources for [place] self pay pcps like [facility], family medicine at [address], [facility] family practice, general medical clinics, [facility] urgent care plus others, [facility] out patient pharmacies, housing, affordable care act/health reform (deadline 06/12/12) and other resources in [employer]. Pt voiced understanding and appreciation of resources provided  Pt agreed to receive these written resources upon discharge along with her d/c instructions. BH RN informed and CM requested pt be given written information at d/c

## 2012-05-31 NOTE — Progress Notes (Signed)
Pt accepted to [facility] 302-1, [place]. Support paperwork faxed.   Catha Gosselin, LCSWA  [phone] 05/31/2012 1952pm

## 2012-05-31 NOTE — ED Notes (Signed)
Attempted to call report to [facility], stated RN assessing other patients at this time and will return call.

## 2012-05-31 NOTE — BHH Counselor (Signed)
Catha Gosselin, social work at [employer] [employer], submitted Pt for admission to [facility]. Thurman Coyer, [facility] confirmed bed availability. Donell Sievert, PA accepted Pt to the service of Geoffery Lyons, MD, room 302-1.  Harlin Rain Patsy [place], LPC, [facility] Assessment Counselor

## 2012-05-31 NOTE — ED Notes (Signed)
Secretary Clydie Braun ran EKG strip reflecting ST depression. Made EDP Knapp aware. No new orders at this time.

## 2012-05-31 NOTE — ED Notes (Signed)
Per EMS pt recently released from [facility] and was living with his girlfriend here in [place], but she recently broke up him and kicked him out.  Pt is currently homeless and has been drinking for the past several days. Between midnight and 3am pt has smoked crack and ingested unknown name/dosage pills, states "took handfull".  Per EMS pt's 12 lead showed inverted T wave.  Pt did [facility] n[facility].    Pt is A&ox4.
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
## 2012-05-31 NOTE — ED Notes (Signed)
Tele psych paperwork faxed and call to request consult completed.

## 2012-05-31 NOTE — Progress Notes (Signed)
Pt referred to [facility], pending review.   Catha Gosselin, LCSWA  [phone] .05/31/2012 7:22pm

## 2012-05-31 NOTE — BH Assessment (Addendum)
Assessment Note   Adam [name] is an 27 y.o. male who presents to the ED after attempted overdose of alcohol, crack cocaine, and unknown 20 pills. CSW met with pt at bedside to complete [facility] assessment. Pt reports he has been living with his girlfriend but 2 weeks ago, pt girlfriend broke up with him. Pt reports all of his family live in [facility] and does not have much support.   Pt reports SI. Patient reports last night he attempted to drink as much alcohol as possible and use as much crack as possible to end his life. Pt reports drinking 2/5 of liquor, and 18 40 oz beers, as well as  20 unknown pills. Pt reports he has always had an alcohol problem, and when he drinks in excessive amounts he will use cocaine. Patient reports that he still feels suicidial. Pt reprots that since his girlfriend and patient have broken up and patient was kicked out of her house, pt states, "I just dont feel like I have anything to live for." Pt is unable to contract for safety.  Pt denies HI/AH/VH.   Pt reports symptoms of depression including: insomnia-4 hours of sleep, decreased appetite, despondent, feelings of worthlessness, and guilt.    Pt reports that at 14 he was prescribed lexapro however did not continue to take medication. Pt does not have any outpatient care. Pt is currently homeless. Pt reports he is scheduled to start a new job in another state April 3rd and waiting for his assignment.    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 No pertinent past medical history. Axis IV: economic problems, housing problems, other psychosocial or environmental problems, problems related to social environment, problems with access to health care services and problems with primary support group Axis V: 21-30 behavior considerably influenced by delusions or hallucinations OR serious impairment in judgment,  communication OR inability to function in almost all areas  Past Medical History: History reviewed. No pertinent past medical history.  Past Surgical History  Procedure Laterality Date  . Hand surgery      Family History: No family history on file.  Social History:  reports that he has been smoking Cigarettes.  He has been smoking about 0.00 packs per day. He does not have any smokeless tobacco history on file. He reports that  drinks alcohol. He reports that he uses illicit drugs (Cocaine).  Additional Social History:  Alcohol / Drug Use History of alcohol / drug use?: Yes Substance #1 Name of Substance 1: Alcohol  1 - Age of First Use: teens 1 - Amount (size/oz): 8-9 40 oz beers  1 - Frequency: daily  1 - Duration: years 1 - Last Use / Amount: 18 40oz beers, 2/5 of liquor,  Substance #2 Name of Substance 2: crack coacine 2 - Age of First Use: 20's  2 - Amount (size/oz): $60-70  2 - Frequency: occasional when drinking 2 - Duration: years 2 - Last Use / Amount: last night $60   CIWA: CIWA-Ar BP: 124/73 mmHg Pulse Rate: 87 COWS:    Allergies: No Known Allergies  Home Medications:  (Not in a hospital admission)  OB/GYN Status:  No LMP for male patient.  General Assessment Data Location of Assessment: [facility] Living Arrangements: Other (Comment) (homeless) Can pt return to current living arrangement?: Yes Admission Status: Voluntary Is patient capable of signing voluntary admission?: Yes Transfer from: Home Referral Source: Self/Family/Friend  Education Status Is patient currently in school?: No Highest grade of school patient has completed: some college  Risk to self Suicidal Ideation: Yes-Currently Present Suicidal Intent: Yes-Currently Present Is patient at risk for suicide?: Yes Suicidal Plan?: Yes-Currently Present Specify Current Suicidal Plan: alcohol, crack, and pills Access to Means: Yes Specify Access to Suicidal Means: attempted last access to  alcohol crack and pills What has been your use of drugs/alcohol within the last 12 months?: alcohol and crack Previous Attempts/Gestures: No How many times?: 0 Other Self Harm Risks: no Triggers for Past Attempts: Unknown Intentional Self Injurious Behavior: None Family Suicide History: No Recent stressful life event(s): Loss (Comment) (girlfriend broke up with pt 2 weeks ago) Persecutory voices/beliefs?: No Depression: Yes Depression Symptoms: Despondent;Insomnia;Tearfulness;Loss of interest in usual pleasures;Feeling worthless/self pity Substance abuse history and/or treatment for substance abuse?: Yes  Risk to Others Homicidal Ideation: No Thoughts of Harm to Others: No Current Homicidal Intent: No Current Homicidal Plan: No Access to Homicidal Means: No Identified Victim: n/a History of harm to others?: No Assessment of Violence: None Noted Violent Behavior Description: no Does patient have access to weapons?: No Criminal Charges Pending?: No Does patient have a court date: No  Psychosis Hallucinations: None noted Delusions: None noted  Mental Status Report Appear/Hygiene: Other (Comment) (calm and cooperative) Eye Contact: Good Motor Activity: Freedom of movement Speech: Logical/coherent Level of Consciousness: Alert Mood: Depressed Affect: Appropriate to circumstance Anxiety Level: Minimal Thought Processes: Coherent;Relevant Judgement: Impaired Orientation: Person;Place;Time;Situation Obsessive Compulsive Thoughts/Behaviors: None  Cognitive Functioning Concentration: Normal Memory: Recent Intact;Remote Intact;Recent Impaired IQ: Average Insight: Poor Impulse Control: Poor Appetite: Poor Sleep: Decreased Total Hours of Sleep: 4 Vegetative Symptoms: None  ADLScreening [facility]) Patient's cognitive ability adequate to safely complete daily activities?: Yes Patient able to express need for assistance with ADLs?: Yes Independently performs  ADLs?: Yes (appropriate for developmental age)  Abuse/Neglect [place]) Physical Abuse: Denies Verbal Abuse: Denies Sexual Abuse: Denies  Prior Inpatient Therapy Prior Inpatient Therapy: No  Prior Outpatient Therapy Prior Outpatient Therapy: No  ADL Screening (condition at time of admission) Patient's cognitive ability adequate to safely complete daily activities?: Yes Patient able to express need for assistance with ADLs?: Yes Independently performs ADLs?: Yes (appropriate for developmental age)       Abuse/Neglect Assessment (Assessment to be complete while patient is alone) Physical Abuse: Denies Verbal Abuse: Denies Sexual Abuse: Denies Values / Beliefs Cultural Requests During Hospitalization: None Spiritual Requests During Hospitalization: None        Additional Information 1:1 In Past 12 Months?: No CIRT Risk: No Elopement Risk: No Does patient have medical clearance?: Yes     Disposition:  Disposition Disposition of Patient: Inpatient treatment program Type of inpatient treatment program: Adult  On Site Evaluation by:   Reviewed with Physician:     Catha Gosselin A 05/31/2012 7:13 PM

## 2012-06-01 ENCOUNTER — Encounter (HOSPITAL_COMMUNITY): Payer: Self-pay | Admitting: Psychiatry

## 2012-06-01 DIAGNOSIS — F1994 Other psychoactive substance use, unspecified with psychoactive substance-induced mood disorder: Secondary | ICD-10-CM

## 2012-06-01 DIAGNOSIS — F1020 Alcohol dependence, uncomplicated: Secondary | ICD-10-CM | POA: Diagnosis present

## 2012-06-01 DIAGNOSIS — F1420 Cocaine dependence, uncomplicated: Secondary | ICD-10-CM

## 2012-06-01 MED ORDER — CLOTRIMAZOLE 1 % EX CREA
TOPICAL_CREAM | Freq: Two times a day (BID) | CUTANEOUS | Status: DC
  Administered 2012-06-01 (×2): via TOPICAL
  Filled 2012-06-01 (×2): qty 15

## 2012-06-01 NOTE — Progress Notes (Signed)
Adult Psychoeducational Group Note  Date:  06/01/2012 Time:  2:19 PM  Group Topic/Focus:  Overcoming Stress:   The focus of this group is to define stress and help patients assess their triggers.  Participation Level:  Did Not Attend  Additional Comments:  Pt was in consultation with MD at outset of group. Pt did not report to group after MD consultation.  Reinaldo Raddle K 06/01/2012, 2:19 PM

## 2012-06-01 NOTE — Progress Notes (Signed)
D. Pt has been up and has been active while in milieu this evening, attending and participating in various activities. Pt has spoken about wanting to be discharged tomorrow and spoke about having some friends in [place] that he can stay with. Pt has denied any feelings of SI and there are no overt signs or symptoms of withdrawal present at this time. A. Support and encouragement provided. R. Will continue to monitor.
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
## 2012-06-01 NOTE — Progress Notes (Signed)
Patient ID: Adam [name], male   DOB: [birth year], 27 y.o.   MRN: [id number] CSW received call back from [facility] with treatment bed date of June 16, 2012.  Following telephone conversation patient reports to doctor he is ready to discharge and go to [place], requesting bus fare to [place].  CSW will speak to patient and find out next step.  Carney Bern, LCSWA

## 2012-06-01 NOTE — [facility] Suicide Risk Assessment (Signed)
Suicide Risk Assessment  Admission Assessment     Nursing information obtained from:  Patient Demographic factors:  Male;Low socioeconomic status;Unemployed;Living alone Current Mental Status:  Suicidal ideation indicated by patient Loss Factors:  Loss of significant relationship;Financial problems / change in socioeconomic status Historical Factors:  Prior suicide attempts;Family history of mental illness or substance abuse;Impulsivity Risk Reduction Factors:  Sense of responsibility to family;Religious beliefs about death  CLINICAL FACTORS:   Alcohol/Substance Abuse/Dependencies  COGNITIVE FEATURES THAT CONTRIBUTE TO RISK: None identified   SUICIDE RISK:   Mild:  Suicidal ideation of limited frequency, intensity, duration, and specificity.  There are no identifiable plans, no associated intent, mild dysphoria and related symptoms, good self-control (both objective and subjective assessment), few other risk factors, and identifiable protective factors, including available and accessible social support.  PLAN OF CARE: supportive approach/coping skills/relapse prevention                              [facility] Detox protocol/reassess co morbidities  I certify that inpatient services furnished can reasonably be expected to improve the patient's condition.  Adam [name] A 06/01/2012, 4:10 PM

## 2012-06-01 NOTE — Progress Notes (Signed)
D: Patient denies SI/HI and auditory and visual hallucinations. The patient has an anxious mood and affect. The patient states that he is "ready to go home," that he plans to "not drink and use drugs," and that he will "try not to harm" himself.  A: Patient given emotional support from RN. Patient encouraged to come to staff with concerns and/or questions. Patient's medication routine continued. Patient's orders and plan of care reviewed. Patient referred to MD for discharge questions.  R: Patient remains cooperative. Will continue to monitor patient q15 minutes for safety.

## 2012-06-01 NOTE — Progress Notes (Signed)
Patient did attend the evening karaoke group. Pt was engaged and supportive.   

## 2012-06-01 NOTE — Progress Notes (Signed)
[facility] LCSW Aftercare Discharge Planning Group Note  06/01/2012 8:45 AM  Participation Quality:  None; patient met with physician during group  Physician and patient requested CSW refer patient to [facility] 06/01/2012, 4:03 PM

## 2012-06-01 NOTE — Progress Notes (Signed)
Recreation Therapy Notes  Date: 03.20.2014 Time: 3:00pm [facility]      Group Topic/Focus: Goal Setting  Participation Level: Did not attend  Jearl Klinefelter, LRT/CTRS  Jearl Klinefelter 06/01/2012 3:30 PM

## 2012-06-01 NOTE — Progress Notes (Signed)
[facility] LCSW Group Therapy - Late entry   06/01/2012    Type of Therapy:  Group Therapy at 1:15 to 2:30 PM  Participation Level: Appropriate  Participation Quality: Appropriate  Affect: Appropriate   Cognitive:  Appropriate    Insight: Developing   Engagement in Therapy: Engaged  Modes of Intervention: Discussion, exploration, clarification, limit setting, reality testing and support  Summary of Progress/Problems: Group topic pf balance in life was introduced by facilitator with a reading about dysfunctional families. Group members were able to relate to relationships in which people don't regularly get their needs met due to patterns of behavior which often inclde not talking, not feeling, not asking for what we need, and not trusting. Adam [name] was able to process how he often chooses not to share his thoughts about using when things are going well and this makes it easier to use.     Adam [name]
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
## 2012-06-02 DIAGNOSIS — F1410 Cocaine abuse, uncomplicated: Secondary | ICD-10-CM

## 2012-06-02 MED ORDER — TRAZODONE HCL 50 MG PO TABS: 50.0000 mg | ORAL_TABLET | Freq: Every evening | ORAL | Status: DC | PRN

## 2012-06-02 NOTE — [facility] Suicide Risk Assessment (Signed)
Suicide Risk Assessment  Discharge Assessment     Demographic Factors:  Male  Mental Status Per Nursing Assessment::   On Admission:  Suicidal ideation indicated by patient  Current Mental Status by Physician: In full contact with reality. There are no suicidal ideas, plans or intent. His mood is euthymic, his affect is appropriate. He is planning to go to [place] or [place]. From there go to [place]. He is going to get someone to help get a ticket to go to [place]. He has a job waiting for him there.    Loss Factors: NA  Historical Factors: NA  Risk Reduction Factors:   Sense of responsibility to family and Positive social support  Continued Clinical Symptoms:  Depression:   Comorbid alcohol abuse/dependence Alcohol/Substance Abuse/Dependencies  Cognitive Features That Contribute To Risk:  Closed-mindedness Thought constriction (tunnel vision)    Suicide Risk:  Minimal: No identifiable suicidal ideation.  Patients presenting with no risk factors but with morbid ruminations; may be classified as minimal risk based on the severity of the depressive symptoms  Discharge Diagnoses:   AXIS I:  Alcohol Dependence, cocaine absue, Substance induced mood disorder AXIS II:  Deferred AXIS III:   Past Medical History  Diagnosis Date  . Heart murmur     Stated he had an abnormal heart reading  . GERD (gastroesophageal reflux disease)    AXIS IV:  housing problems, other psychosocial or environmental problems and problems with primary support group AXIS V:  61-70 mild symptoms  Plan Of Care/Follow-up recommendations:  Activity:  as tolerated Diet:  regular Follow up with AA, will get connected with local mental health center Is patient on multiple antipsychotic therapies at discharge:  No   Has Patient had three or more failed trials of antipsychotic monotherapy by history:  No  Recommended Plan for Multiple Antipsychotic Therapies: N/A   Jajuan Skoog A 06/02/2012, 12:59  PM

## 2012-06-02 NOTE — Progress Notes (Signed)
Patient ID: Adam [name], male   DOB: [birth year], 27 y.o.   MRN: [id number] Patient discharged per physician order; patient denies SI/HI and A/V hallucinations; patient received samples, prescriptions, copy of AVS, and all associated paperwork given from the social worker after it was reviewed with him; patient had no other questions at this time; patient signed and verbalized that he received all the belongings; patient left the unit ambulatory

## 2012-06-02 NOTE — Progress Notes (Signed)
[facility] INPATIENT:  Family/Significant Other Suicide Prevention Education  Suicide Prevention Education:  Contact Attempts: Ena Dawley at [phone] has been identified by the patient as the friend with whom the patient will be residing with in [place] following discharge and identified as the person who will aid the patient in the event of a mental health crisis.  With written consent from the patient, two attempts were made to provide suicide prevention education, prior to and/or following the patient's discharge.  We were unsuccessful in providing suicide prevention education. Both calls to number were answered by message stating person was unavailable to accept calls at this time. No opportunity to leave a message.   A suicide education pamphlet was given to the patient to share with family/significant other.  Date and time of first attempt:06/02/2012 at 9:30 AM Date and time of second attempt:06/02/2012 at 12:35 PM  Writer provided suicide prevention education directly to patient at 10:50 AM; conversation included risk factors, warning signs and resources to contact for help. Mobile crisis services explained and contact card placed in chart for pt to receive at discharge.  Clide Dales 06/02/2012, 12:35 PM

## 2012-06-02 NOTE — Progress Notes (Signed)
[facility] Adult Case Management Discharge Plan :  Will you be returning to the same living situation after discharge: No. Patient will be going to stay with friend in [place].  At discharge, do you have transportation home?:Yes,  patient contacting father for financial aid with travel and also provided two bus vouchers by [facility] for travel in [place] Do you have the ability to pay for your medications:No. NA as patient refusing followup  Release of information consent forms completed and in the chart;  Patient's signature needed at discharge.  Patient to Follow up at: Follow-up Information   Follow up with Patient Refused.   Contact information:   Patient refused followup; signed ROI stating same. CSW providing AA and NA schedules for [employer] and 24 Hour Mobile Crisis Number for [employer].       Patient denies SI/HI:   Yes,  denies both    Safety Planning and Suicide Prevention discussed:  Yes,  woth Patient; CSW will also continue to contact friend he will be staying with in [place].   Clide Dales 06/02/2012, 11:55 AM
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
## 2012-06-02 NOTE — Tx Team (Signed)
Interdisciplinary Treatment Plan Update (Adult)  Date: 06/02/2012  Time Reviewed: 9:43 AM   Progress in Treatment: Attending groups: Yes Participating in groups: Yes Taking medication as prescribed:  Yes Tolerating medication:  Yes Family/Significant othe contact made: Not as yet Patient understands diagnosis: Yes Discussing patient identified problems/goals with staff: Yes Medical problems stabilized or resolved:  Yes Denies suicidal/homicidal ideation: Yes Patient has not harmed self or Others: Yes  New problem(s) identified: None Identified  Discharge Plan or Barriers:  CSW will provide schedule of AA and NA meetings in [place] area as patient has declined bed date at [facility] and is requesting discharge today to [facility]  Additional comments: N/A  Reason for Continuation of Hospitalization: NA  Estimated length of stay: Discharging today  For review of initial/current patient goals, please see plan of care.  Attendees: Patient:     Family:     Physician:  Geoffery Lyons 06/02/2012 9:43 AM   Nursing:   Leighton Parody, RN 06/02/2012 9:43 AM   Clinical Social Worker Ronda Fairly 06/02/2012 9:43 AM   Other:   06/02/2012 9:43 AM   Other:  Margret Chance, RN 06/02/2012 9:43 AM   Other:   06/02/2012 9:43 AM   Other:   06/02/2012 9:43 AM    Scribe for Treatment Team:   Carney Bern, LCSWA  06/02/2012 9:43 AM

## 2012-06-02 NOTE — Progress Notes (Signed)
Pt. Is resting quietly/ No signs of distress or discomfort noted at this time. 

## 2012-06-02 NOTE — Progress Notes (Signed)
[facility] LCSW Aftercare Discharge Planning Group Note  06/02/2012 8:45 AM  Participation Quality:  Appropriate  Affect: Basically appropriate  Anxious  Cognitive:  Oriented  Insight:  Developing  Engagement in Group:  Developing/Improving  Modes of Intervention:  Clarification, Discussion, Exploration and Support  Summary of Progress/Problems:  Pt reports he is ready for discharge and he denies both suicidal and homicidal ideation.  On a scale of 1 to 10 with ten being the most ever experienced, the patient rates depression at a 0 and anxiety at a 0.  Patient reports CSW can contact friend in [place] where he is going to provide SPE; pt provided number.  Patient needs no followup in [place] but would like AA and NA schedules which will be provided.  Patient to arrange his own transportation to [place] but will be provided two bus vouchers for transport in [place].  Jermanie plans to contact father for financial help with travel.    Clide Dales

## 2012-06-02 NOTE — Progress Notes (Signed)
[facility] Group Notes:  (Nursing/MHT/Case Management/Adjunct)  Date:  06/02/2012  Time:  2:02 PM  Type of Therapy:  Therapeutic Activity  Participation Level:  Active  Participation Quality:  Appropriate, Attentive and Sharing  Affect:  Appropriate and Excited  Cognitive:  Alert and Appropriate  Insight:  Appropriate and Good  Engagement in Group:  Engaged  Modes of Intervention:  Activity and Socialization  Summary of Progress/Problems: Pts were asked to participate in a game of Pictionary.Discussion was had at end of group about the importance of activities such as this in life and the benefits. Pt was interested in participating in group and helped promote participation from other pts during group. Pt appeared to understand the concept of group as he stated at the end of group "I can see me doing a game like this with my family."   Dalia Heading 06/02/2012, 2:02 PM

## 2012-06-03 ENCOUNTER — Encounter (HOSPITAL_COMMUNITY): Payer: Self-pay | Admitting: Emergency Medicine

## 2012-06-03 ENCOUNTER — Emergency Department (HOSPITAL_COMMUNITY)
Admission: EM | Admit: 2012-06-03 | Discharge: 2012-06-04 | Disposition: A | Discharge status: Home / Self Care | Payer: Self-pay | Attending: Emergency Medicine | Admitting: Emergency Medicine

## 2012-06-03 DIAGNOSIS — R4789 Other speech disturbances: Secondary | ICD-10-CM | POA: Insufficient documentation

## 2012-06-03 DIAGNOSIS — F329 Major depressive disorder, single episode, unspecified: Secondary | ICD-10-CM | POA: Insufficient documentation

## 2012-06-03 DIAGNOSIS — F1493 Cocaine use, unspecified with withdrawal: Secondary | ICD-10-CM

## 2012-06-03 DIAGNOSIS — F3289 Other specified depressive episodes: Secondary | ICD-10-CM | POA: Insufficient documentation

## 2012-06-03 DIAGNOSIS — R45851 Suicidal ideations: Secondary | ICD-10-CM | POA: Insufficient documentation

## 2012-06-03 DIAGNOSIS — F17200 Nicotine dependence, unspecified, uncomplicated: Secondary | ICD-10-CM | POA: Insufficient documentation

## 2012-06-03 DIAGNOSIS — F1910 Other psychoactive substance abuse, uncomplicated: Secondary | ICD-10-CM

## 2012-06-03 DIAGNOSIS — R011 Cardiac murmur, unspecified: Secondary | ICD-10-CM | POA: Insufficient documentation

## 2012-06-03 DIAGNOSIS — Z8719 Personal history of other diseases of the digestive system: Secondary | ICD-10-CM | POA: Insufficient documentation

## 2012-06-03 DIAGNOSIS — F19939 Other psychoactive substance use, unspecified with withdrawal, unspecified: Secondary | ICD-10-CM | POA: Insufficient documentation

## 2012-06-03 DIAGNOSIS — R61 Generalized hyperhidrosis: Secondary | ICD-10-CM | POA: Insufficient documentation

## 2012-06-03 DIAGNOSIS — F1423 Cocaine dependence with withdrawal: Secondary | ICD-10-CM

## 2012-06-03 DIAGNOSIS — G4700 Insomnia, unspecified: Secondary | ICD-10-CM | POA: Insufficient documentation

## 2012-06-03 HISTORY — DX: Depression, unspecified: F32.A

## 2012-06-03 HISTORY — DX: Other psychoactive substance abuse, uncomplicated: F19.10

## 2012-06-03 HISTORY — DX: Major depressive disorder, single episode, unspecified: F32.9

## 2012-06-03 LAB — CBC
HCT: 42.5 % (ref 39.0–52.0)
Hemoglobin: 15.3 g/dL (ref 13.0–17.0)
MCH: 31.1 pg (ref 26.0–34.0)
MCHC: 36 g/dL (ref 30.0–36.0)
MCV: 86.4 fL (ref 78.0–100.0)
Platelets: 217 10*3/uL (ref 150–400)
RBC: 4.92 MIL/uL (ref 4.22–5.81)
RDW: 14.6 % (ref 11.5–15.5)
WBC: 9.9 10*3/uL (ref 4.0–10.5)

## 2012-06-03 LAB — COMPREHENSIVE METABOLIC PANEL WITH GFR
ALT: 19 U/L (ref 0–53)
Alkaline Phosphatase: 58 U/L (ref 39–117)
CO2: 25 meq/L (ref 19–32)
Calcium: 8.8 mg/dL (ref 8.4–10.5)
GFR calc Af Amer: >90 mL/min (ref >90)
GFR calc non Af Amer: 87 mL/min — ABNORMAL LOW (ref >90)
Glucose, Bld: 114 mg/dL — ABNORMAL HIGH (ref 70–99)
Sodium: 142 meq/L (ref 135–145)
Total Bilirubin: 0.3 mg/dL (ref 0.3–1.2)

## 2012-06-03 LAB — ACETAMINOPHEN LEVEL: Acetaminophen (Tylenol), Serum: <15 ug/mL (ref 10–30)

## 2012-06-03 LAB — COMPREHENSIVE METABOLIC PANEL
AST: 21 U/L (ref 0–37)
Albumin: 3.2 g/dL — ABNORMAL LOW (ref 3.5–5.2)
BUN: 10 mg/dL (ref 6–23)
Chloride: 104 mEq/L (ref 96–112)
Creatinine, Ser: 1.13 mg/dL (ref 0.50–1.35)
Potassium: 3.9 mEq/L (ref 3.5–5.1)
Total Protein: 6.4 g/dL (ref 6.0–8.3)

## 2012-06-03 LAB — RAPID URINE DRUG SCREEN, HOSP PERFORMED
Amphetamines: NOT DETECTED
Barbiturates: NOT DETECTED
Benzodiazepines: POSITIVE — AB
Cocaine: POSITIVE — AB
Opiates: NOT DETECTED
Tetrahydrocannabinol: NOT DETECTED

## 2012-06-03 LAB — SALICYLATE LEVEL: Salicylate Lvl: <2 mg/dL — ABNORMAL LOW (ref 2.8–20.0)

## 2012-06-03 LAB — ETHANOL: Alcohol, Ethyl (B): <11 mg/dL (ref 0–11)

## 2012-06-03 MED ORDER — IBUPROFEN 400 MG PO TABS: 600.0000 mg | ORAL_TABLET | Freq: Three times a day (TID) | ORAL | Status: DC | PRN

## 2012-06-03 MED ORDER — ACETAMINOPHEN 325 MG PO TABS: 650.0000 mg | ORAL_TABLET | ORAL | Status: DC | PRN

## 2012-06-03 NOTE — ED Notes (Signed)
Relieve sitter for break.

## 2012-06-03 NOTE — ED Notes (Signed)
Pt found by [facility] on second floor by central elevators looking weak. Pt reports suicidal ideation. Pt admits to using crack and alcohol, last use 1200. Pt admits to using drugs and alcohol all last night. Pt is going thorough separation from significant other

## 2012-06-03 NOTE — ED Notes (Signed)
Sitter returned from lunch break.

## 2012-06-03 NOTE — ED Notes (Signed)
Pt given ginger ale and ordered a diet tray

## 2012-06-03 NOTE — ED Provider Notes (Signed)
History     CSN: [id number]  Arrival date & time 06/03/12  1441   First MD Initiated Contact with Patient 06/03/12 1454      Chief Complaint  Patient presents with  . Suicidal  . Addiction Problem  . Alcohol Intoxication    (Consider location/radiation/quality/duration/timing/severity/associated sxs/prior treatment) HPI Comments: Level 5 caveat due to poor historian being on drugs recently including alcohol and crack cocaine.  Pt reports was depressed, suicidal, doing alcohol and crack after being kicked out his GF's home.  He presented to [facility] and was taken to [facility] and he wished to be released after 1-2 days of treatment.  He left there with a friend and immediately went drinking, and then he went and got crack again, last dose today.  He has not slept, reports problems with insomnia.  He still feels SI now.  Plan was to do drugs until he died.  He was given trazadone from [facility], he has not been taking.  No overdose that he admits to currently on OTC meds.  No CP.  He admits to being very sweaty. No SOB, N/V, abd pain, HA.    Patient is a 27 y.o. male presenting with intoxication. The history is provided by the patient and medical records.  Alcohol Intoxication    Past Medical History  Diagnosis Date  . Heart murmur     Stated he had an abnormal heart reading  . GERD (gastroesophageal reflux disease)   . Polysubstance abuse   . Depression     Past Surgical History  Procedure Laterality Date  . Hand surgery    . Fracture surgery  2013    rt hand    No family history on file.  History  Substance Use Topics  . Smoking status: Current Every Day Smoker -- 1.00 packs/day for 6 years    Types: Cigarettes  . Smokeless tobacco: Not on file  . Alcohol Use: Yes     Comment: 9 to 10 40's daily      Review of Systems  Unable to perform ROS: Psychiatric disorder    Allergies  Review of patient's allergies indicates no known allergies.  Home Medications   Current Outpatient  Rx  Name  Route  Sig  Dispense  Refill  . traZODone (DESYREL) 50 MG tablet   Oral   Take 1 tablet (50 mg total) by mouth at bedtime and may repeat dose one time if needed. For depression/anxiety   60 tablet   0     BP 117/66  Pulse 84  Temp(Src) 98 F (36.7 C) (Oral)  Resp 18  SpO2 95%  Physical Exam  Nursing note and vitals reviewed. Constitutional: He appears well-developed and well-nourished.  HENT:  Head: Normocephalic and atraumatic.  Eyes: EOM are normal.  Neck: Normal range of motion. Neck supple.  Cardiovascular: Normal rate and regular rhythm.   Pulmonary/Chest: Effort normal. No respiratory distress.  Abdominal: Soft. He exhibits no distension. There is no tenderness.  Neurological: He is alert.  Pt is oriented to self, did not know exact day of the week, but knew year, month, and knew he was at [facility] ED.    Skin: Skin is warm. No rash noted. No pallor.  Psychiatric: His mood appears not anxious. His affect is not angry. His speech is delayed and slurred. He is slowed. He expresses impulsivity. He expresses suicidal ideation. He expresses no homicidal ideation. He expresses suicidal plans. He expresses no homicidal plans. He exhibits abnormal recent memory.  He exhibits normal remote memory.    ED Course  Procedures (including critical care time)  Labs Reviewed  COMPREHENSIVE METABOLIC PANEL - Abnormal; Notable for the following:    Glucose, Bld 114 (*)    Albumin 3.2 (*)    GFR calc non Af Amer 87 (*)    All other components within normal limits  SALICYLATE LEVEL - Abnormal; Notable for the following:    Salicylate Lvl <2.0 (*)    All other components within normal limits  URINE RAPID DRUG SCREEN (HOSP PERFORMED) - Abnormal; Notable for the following:    Cocaine POSITIVE (*)    Benzodiazepines POSITIVE (*)    All other components within normal limits  ACETAMINOPHEN LEVEL  CBC  [name]   No results found.   1. Polysubstance abuse   2. Cocaine withdrawal    3. Suicidal ideation     ra sat is 95% and I interpret to be normal  6:25 PM Pt remains more somnolent, but no airway issues.  I suspect cocaine wash out syndrome.  Labs are otherwise ok.  Once pt can participate will consult ACT or Telepsych.   9:00 PM Berna Spare with ACT notified of patient and need for consultation.  MDM  3:24 PM Pt just left and immediately relapsed onto drugs. Pt was just cleared from SI once he was sober, he had no SI.  Now that he has relapsed, I question whether he only gets depressed when he does drugs.  Pt is clearly under the influence currently, although cocaine washout may be contributing to his slowed affect.  Will perform medical screening labs, monitor until sober, consider telepsych to clear from a psych standpoint.          Gavin Pound. Evans Levee, MD 06/03/12 2241
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
## 2012-06-04 NOTE — Progress Notes (Signed)
Per Dr. Ferol Luz placement for patient should be sought elsewhere since patient was just discharged.

## 2012-06-04 NOTE — ED Notes (Signed)
Sitter returns from second lunch break.

## 2012-06-04 NOTE — ED Notes (Signed)
Pt stating that he is ready to go home and that he wants to comb his hair.

## 2012-06-04 NOTE — ED Notes (Signed)
Sitter left for Lunch @ 2:25.

## 2012-06-04 NOTE — ED Provider Notes (Signed)
  Physical Exam  BP 122/77  Pulse 55  Temp(Src) 97.2 F (36.2 C) (Oral)  Resp 16  SpO2 98%  Physical Exam  ED Course  Procedures  MDM Patient has been evaluated by the act team, he is no longer suicidal, does not endorse any abnormal symptoms at this time until stable for discharge.      Vida Roller, MD 06/04/12 [phone]

## 2012-06-04 NOTE — BH Assessment (Signed)
Assessment Note   Adam [name] is an 27 y.o. male.  Patient was discharged from [facility] on 03/21.  He had been encouraged to stay and complete treatment but was eager to meet with friends and go to [place] presumably.  Instead patient met with friends and stayed in [place].  Patient admits that he should have stayed at [facility].  Instead patient smoked "a ton of crack" in an effort to kill himself.  He also drank about 7-8 forty oz beers and took a couple of vicodin or percocet.  He said that he did this to try to kill himself and to get away from his hurt over breakup with girlfriend.  Patient had been living with girlfriend but he left the home at her request 2 weeks ago.  Patient has been on the street or in crack houses since.  Patient is currently suicidal with plan to overdose on street drugs.  He has access to drugs on street.  Patient denies any HI or A/V hallucinations.  He would like to try to be admitted back to [facility] to complete his treatment as he now realizes he needs to.  Pt referred to [facility]. Axis I: Alcohol Abuse, Major Depression, single episode and Cocaine abuse Axis II: Deferred Axis III:  Past Medical History  Diagnosis Date  . Heart murmur     Stated he had an abnormal heart reading  . GERD (gastroesophageal reflux disease)   . Polysubstance abuse   . Depression    Axis IV: economic problems, housing problems, occupational problems, problems related to social environment and problems with primary support group Axis V: 31-40 impairment in reality testing  Past Medical History:  Past Medical History  Diagnosis Date  . Heart murmur     Stated he had an abnormal heart reading  . GERD (gastroesophageal reflux disease)   . Polysubstance abuse   . Depression     Past Surgical History  Procedure Laterality Date  . Hand surgery    . Fracture surgery  2013    rt hand    Family History: No family history on file.  Social History:  reports that he has been smoking Cigarettes.  He  has a 6 pack-year smoking history. He does not have any smokeless tobacco history on file. He reports that  drinks alcohol. He reports that he uses illicit drugs ("Crack" cocaine).  Additional Social History:  Alcohol / Drug Use Pain Medications: Has used percocet from street within the last 24 hours Prescriptions: See list of discharge medications from [facility].  Pt has not taken any since he was discharged on 06/02/12 Over the Counter: N/A History of alcohol / drug use?: Yes Negative Consequences of Use: Personal relationships Substance #1 Name of Substance 1: ETOH 1 - Age of First Use: Teens 1 - Amount (size/oz): 8-9 40oz beers 1 - Frequency: Daily use 1 - Duration: On-going 1 - Last Use / Amount: 03/21 Drank 8-9 beers upon discharge Substance #2 Name of Substance 2: Crack cocaine 2 - Age of First Use: 20's 2 - Amount (size/oz): $70+ worth 2 - Frequency: When he can get the money 2 - Duration: On-going 2 - Last Use / Amount: 03/21 used over $70 worth at least after being d/c'ed.  CIWA: CIWA-Ar BP: 130/92 mmHg Pulse Rate: 77 COWS:    Allergies: No Known Allergies  Home Medications:  (Not in a hospital admission)  OB/GYN Status:  No LMP for male patient.  General Assessment Data [facility] of Assessment: [facility]  ED ACT Assessment: Yes Living Arrangements: Other (Comment) (Pt is currently homeless) Can pt return to current living arrangement?: Yes Admission Status: Voluntary Is patient capable of signing voluntary admission?: Yes Transfer from: Acute Hospital Referral Source: Self/Family/Friend     Risk to self Suicidal Ideation: Yes-Currently Present Suicidal Intent: Yes-Currently Present Is patient at risk for suicide?: Yes Suicidal Plan?: Yes-Currently Present Specify Current Suicidal Plan: OD on street drugs Access to Means: Yes Specify Access to Suicidal Means: Attempted to OD on crack & ETOH & pills What has been your use of drugs/alcohol within the last 12 months?:  ETOH, crack and pain pills Previous Attempts/Gestures: Yes How many times?: 1 Other Self Harm Risks: SA issues Triggers for Past Attempts: None known Intentional Self Injurious Behavior: None Family Suicide History: No Recent stressful life event(s): Loss (Comment) (Breakup w/ girlfriend.  No home now.) Persecutory voices/beliefs?: No Depression: Yes Depression Symptoms: Despondent;Insomnia;Guilt;Loss of interest in usual pleasures;Feeling worthless/self pity Substance abuse history and/or treatment for substance abuse?: Yes Suicide prevention information given to non-admitted patients: Not applicable  Risk to Others Homicidal Ideation: No Thoughts of Harm to Others: No Current Homicidal Intent: No Current Homicidal Plan: No Access to Homicidal Means: No Identified Victim: No one History of harm to others?: No Assessment of Violence: None Noted Violent Behavior Description: None Does patient have access to weapons?: No Criminal Charges Pending?: No Does patient have a court date: No  Psychosis Hallucinations: None noted Delusions: None noted  Mental Status Report Appear/Hygiene: Disheveled Eye Contact: Good Motor Activity: Freedom of movement;Unremarkable Speech: Logical/coherent Level of Consciousness: Alert Mood: Depressed;Sad Affect: Depressed;Anxious Anxiety Level: Minimal Thought Processes: Coherent;Relevant Judgement: Impaired Orientation: Person;Place;Time;Situation Obsessive Compulsive Thoughts/Behaviors: None  Cognitive Functioning Concentration: Normal Memory: Remote Intact;Recent Impaired IQ: Average Insight: Poor Impulse Control: Poor Appetite: Poor Weight Loss: 0 Weight Gain: 0 Sleep: Decreased Total Hours of Sleep:  (<4H/D) Vegetative Symptoms: None  ADLScreening [facility] Assessment Services) Patient's cognitive ability adequate to safely complete daily activities?: Yes Patient able to express need for assistance with ADLs?: Yes Independently  performs ADLs?: Yes (appropriate for developmental age)  Abuse/Neglect [facility]) Physical Abuse: Denies Verbal Abuse: Denies Sexual Abuse: Denies  Prior Inpatient Therapy Prior Inpatient Therapy: Yes Prior Therapy Dates: March 19-21, 2014 Prior Therapy Facilty/Provider(s): [facility] Reason for Treatment: SA, SI  Prior Outpatient Therapy Prior Outpatient Therapy: No Prior Therapy Dates: None Prior Therapy Facilty/Provider(s): N/A Reason for Treatment: N/A  ADL Screening (condition at time of admission) Patient's cognitive ability adequate to safely complete daily activities?: Yes Patient able to express need for assistance with ADLs?: Yes Independently performs ADLs?: Yes (appropriate for developmental age) Weakness of Legs: None Weakness of Arms/Hands: None  Home Assistive Devices/Equipment Home Assistive Devices/Equipment: None    Abuse/Neglect Assessment (Assessment to be complete while patient is alone) Physical Abuse: Denies Verbal Abuse: Denies Sexual Abuse: Denies Exploitation of patient/patient's resources: Denies Values / Beliefs Cultural Requests During Hospitalization: None Spiritual Requests During Hospitalization: None   Advance Directives (For Healthcare) Advance Directive: Patient does not have advance directive;Patient would not like information    Additional Information 1:1 In Past 12 Months?: No CIRT Risk: No Elopement Risk: No Does patient have medical clearance?: Yes     Disposition:  Disposition Initial Assessment Completed for this Encounter: Yes Disposition of Patient: Inpatient treatment program;Referred to Type of inpatient treatment program: Adult Patient referred to:  (Pt referred to [facility]  On Site Evaluation by:   Reviewed with Physician:  Dr. Helyn App, Berna Spare  Ray 06/04/2012 3:07 AM

## 2012-06-05 ENCOUNTER — Emergency Department (HOSPITAL_COMMUNITY): Payer: Self-pay

## 2012-06-05 ENCOUNTER — Emergency Department (HOSPITAL_COMMUNITY)
Admission: EM | Admit: 2012-06-05 | Discharge: 2012-06-06 | Disposition: A | Discharge status: Home / Self Care | Payer: Self-pay | Attending: Emergency Medicine | Admitting: Emergency Medicine

## 2012-06-05 DIAGNOSIS — F3289 Other specified depressive episodes: Secondary | ICD-10-CM | POA: Insufficient documentation

## 2012-06-05 DIAGNOSIS — F29 Unspecified psychosis not due to a substance or known physiological condition: Secondary | ICD-10-CM | POA: Insufficient documentation

## 2012-06-05 DIAGNOSIS — IMO0002 Reserved for concepts with insufficient information to code with codable children: Secondary | ICD-10-CM | POA: Insufficient documentation

## 2012-06-05 DIAGNOSIS — R079 Chest pain, unspecified: Secondary | ICD-10-CM | POA: Insufficient documentation

## 2012-06-05 DIAGNOSIS — Y92410 Unspecified street and highway as the place of occurrence of the external cause: Secondary | ICD-10-CM | POA: Insufficient documentation

## 2012-06-05 DIAGNOSIS — F17200 Nicotine dependence, unspecified, uncomplicated: Secondary | ICD-10-CM | POA: Insufficient documentation

## 2012-06-05 DIAGNOSIS — F1410 Cocaine abuse, uncomplicated: Secondary | ICD-10-CM | POA: Insufficient documentation

## 2012-06-05 DIAGNOSIS — M25552 Pain in left hip: Secondary | ICD-10-CM

## 2012-06-05 DIAGNOSIS — R011 Cardiac murmur, unspecified: Secondary | ICD-10-CM | POA: Insufficient documentation

## 2012-06-05 DIAGNOSIS — Y939 Activity, unspecified: Secondary | ICD-10-CM | POA: Insufficient documentation

## 2012-06-05 DIAGNOSIS — S79919A Unspecified injury of unspecified hip, initial encounter: Secondary | ICD-10-CM | POA: Insufficient documentation

## 2012-06-05 DIAGNOSIS — Z8719 Personal history of other diseases of the digestive system: Secondary | ICD-10-CM | POA: Insufficient documentation

## 2012-06-05 DIAGNOSIS — F329 Major depressive disorder, single episode, unspecified: Secondary | ICD-10-CM | POA: Insufficient documentation

## 2012-06-05 LAB — COMPREHENSIVE METABOLIC PANEL
Albumin: 3.5 g/dL (ref 3.5–5.2)
BUN: 11 mg/dL (ref 6–23)
Chloride: 104 mEq/L (ref 96–112)
Creatinine, Ser: 1.04 mg/dL (ref 0.50–1.35)
GFR calc Af Amer: >90 mL/min (ref >90)
Glucose, Bld: 115 mg/dL — ABNORMAL HIGH (ref 70–99)
Total Bilirubin: 0.2 mg/dL — ABNORMAL LOW (ref 0.3–1.2)
Total Protein: 7.1 g/dL (ref 6.0–8.3)

## 2012-06-05 LAB — CBC
HCT: 47 % (ref 39.0–52.0)
Hemoglobin: 17.3 g/dL — ABNORMAL HIGH (ref 13.0–17.0)
MCHC: 36.8 g/dL — ABNORMAL HIGH (ref 30.0–36.0)
MCV: 85.1 fL (ref 78.0–100.0)
RDW: 14.4 % (ref 11.5–15.5)

## 2012-06-05 LAB — ETHANOL: Alcohol, Ethyl (B): 93 mg/dL — ABNORMAL HIGH (ref 0–11)

## 2012-06-05 LAB — ACETAMINOPHEN LEVEL: Acetaminophen (Tylenol), Serum: <15 ug/mL (ref 10–30)

## 2012-06-05 MED ORDER — HYDRALAZINE HCL 20 MG/ML IJ SOLN
10.0000 mg | Freq: Once | INTRAMUSCULAR | Status: AC
  Administered 2012-06-05: 10 mg via INTRAVENOUS
  Filled 2012-06-05: qty 1
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
## 2012-06-05 NOTE — ED Notes (Signed)
Gave pt ginger ale to drink per pt's request.

## 2012-06-06 ENCOUNTER — Emergency Department (HOSPITAL_COMMUNITY): Payer: Self-pay

## 2012-06-06 LAB — RAPID URINE DRUG SCREEN, HOSP PERFORMED
Barbiturates: NOT DETECTED
Cocaine: POSITIVE — AB
Tetrahydrocannabinol: NOT DETECTED

## 2012-06-06 MED ORDER — IOHEXOL 300 MG/ML  SOLN
100.0000 mL | Freq: Once | INTRAMUSCULAR | Status: AC | PRN
  Administered 2012-06-06: 100 mL via INTRAVENOUS

## 2012-06-06 MED ORDER — SODIUM CHLORIDE 0.9 % IV BOLUS (SEPSIS)
1000.0000 mL | Freq: Once | INTRAVENOUS | Status: AC
  Administered 2012-06-06: 1000 mL via INTRAVENOUS

## 2012-06-06 NOTE — ED Provider Notes (Signed)
8:05 AM The patient was seen and evaluated by the psychiatrist.  Please see his full consultation note for details.  The patient be discharged home with outpatient resources for his alcohol abuse.  Lyanne Co, MD 06/06/12 [phone]
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
## 2012-06-06 NOTE — ED Notes (Signed)
Pt was aroused and asked if he still had thoughts of suicide? Pt. Answered Yes.

## 2012-06-06 NOTE — BH Assessment (Signed)
Assessment Note  Update:  Pt received telepsych consult recommending pt be discharged to follow up with outpatient treatment.  Pt denies SI/HI or psychosis, but refused to sign No Harm contract with this clinician.  Pt told psychiatrist he was no longer suicidal.  Pt was given outpatient referrals and stated he was going to follow up with [facility].  Pt requested to talk with social work, as he is homeless.  Pt's girlfriend kicked him out.  Called Cassandra, SW, and informed her about pt.  EDP Campos in agreement with disposition.  Completed assessment, assessment notification and faxed to [facility] to log.  Pt to be discharged.    Disposition:  Disposition Initial Assessment Completed for this Encounter: Yes Disposition of Patient: Referred to;Outpatient treatment Type of inpatient treatment program: Adult Type of outpatient treatment: Adult Patient referred to: Outpatient clinic referral (Pt given [facility] OP resources)  On Site Evaluation by:   Reviewed with Physician:  Elsie Lincoln, Rennis Harding 06/06/2012 8:39 AM
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
## 2012-06-07 NOTE — Progress Notes (Signed)
Patient Discharge Instructions:  No documentation sent patient refused follow up with a provider per avs.  Jerelene Redden, 06/07/2012, 1:38 PM

## 2012-06-08 ENCOUNTER — Emergency Department (HOSPITAL_COMMUNITY)
Admission: EM | Admit: 2012-06-08 | Discharge: 2012-06-09 | Disposition: A | Discharge status: Home / Self Care | Payer: Self-pay | Attending: Emergency Medicine | Admitting: Emergency Medicine

## 2012-06-08 ENCOUNTER — Emergency Department (HOSPITAL_COMMUNITY): Payer: Self-pay

## 2012-06-08 ENCOUNTER — Encounter (HOSPITAL_COMMUNITY): Payer: Self-pay | Admitting: Emergency Medicine

## 2012-06-08 DIAGNOSIS — R011 Cardiac murmur, unspecified: Secondary | ICD-10-CM | POA: Insufficient documentation

## 2012-06-08 DIAGNOSIS — Z8719 Personal history of other diseases of the digestive system: Secondary | ICD-10-CM | POA: Insufficient documentation

## 2012-06-08 DIAGNOSIS — F1410 Cocaine abuse, uncomplicated: Secondary | ICD-10-CM

## 2012-06-08 DIAGNOSIS — F1994 Other psychoactive substance use, unspecified with psychoactive substance-induced mood disorder: Secondary | ICD-10-CM

## 2012-06-08 DIAGNOSIS — Z8659 Personal history of other mental and behavioral disorders: Secondary | ICD-10-CM | POA: Insufficient documentation

## 2012-06-08 DIAGNOSIS — R45851 Suicidal ideations: Secondary | ICD-10-CM | POA: Insufficient documentation

## 2012-06-08 DIAGNOSIS — S0990XA Unspecified injury of head, initial encounter: Secondary | ICD-10-CM | POA: Insufficient documentation

## 2012-06-08 DIAGNOSIS — F17200 Nicotine dependence, unspecified, uncomplicated: Secondary | ICD-10-CM | POA: Insufficient documentation

## 2012-06-08 DIAGNOSIS — F1910 Other psychoactive substance abuse, uncomplicated: Secondary | ICD-10-CM | POA: Insufficient documentation

## 2012-06-08 DIAGNOSIS — S0993XA Unspecified injury of face, initial encounter: Secondary | ICD-10-CM | POA: Insufficient documentation

## 2012-06-08 DIAGNOSIS — F1020 Alcohol dependence, uncomplicated: Secondary | ICD-10-CM

## 2012-06-08 LAB — CBC WITH DIFFERENTIAL/PLATELET
Basophils Absolute: 0 10*3/uL (ref 0.0–0.1)
Basophils Relative: 0 % (ref 0–1)
Eosinophils Absolute: 0.1 10*3/uL (ref 0.0–0.7)
HCT: 43.6 % (ref 39.0–52.0)
Hemoglobin: 14.9 g/dL (ref 13.0–17.0)
Lymphocytes Relative: 22 % (ref 12–46)
MCHC: 34.2 g/dL (ref 30.0–36.0)
Monocytes Relative: 8 % (ref 3–12)
Neutro Abs: 7.4 10*3/uL (ref 1.7–7.7)
Neutrophils Relative %: 69 % (ref 43–77)
WBC: 10.6 10*3/uL — ABNORMAL HIGH (ref 4.0–10.5)

## 2012-06-08 LAB — URINALYSIS, ROUTINE W REFLEX MICROSCOPIC
Ketones, ur: NEGATIVE mg/dL
Leukocytes, UA: NEGATIVE
Nitrite: NEGATIVE
Protein, ur: NEGATIVE mg/dL
Urobilinogen, UA: 0.2 mg/dL (ref 0.0–1.0)

## 2012-06-08 LAB — RAPID URINE DRUG SCREEN, HOSP PERFORMED
Amphetamines: NOT DETECTED
Barbiturates: NOT DETECTED
Benzodiazepines: POSITIVE — AB
Tetrahydrocannabinol: NOT DETECTED

## 2012-06-08 LAB — COMPREHENSIVE METABOLIC PANEL
ALT: 17 U/L (ref 0–53)
Albumin: 3.2 g/dL — ABNORMAL LOW (ref 3.5–5.2)
Alkaline Phosphatase: 65 U/L (ref 39–117)
BUN: 11 mg/dL (ref 6–23)
Chloride: 101 mEq/L (ref 96–112)
Glucose, Bld: 66 mg/dL — ABNORMAL LOW (ref 70–99)
Potassium: 3.4 mEq/L — ABNORMAL LOW (ref 3.5–5.1)
Sodium: 139 mEq/L (ref 135–145)
Total Bilirubin: 0.2 mg/dL — ABNORMAL LOW (ref 0.3–1.2)
Total Protein: 6.9 g/dL (ref 6.0–8.3)

## 2012-06-08 LAB — TROPONIN I: Troponin I: <0.3 ng/mL (ref <0.30)

## 2012-06-08 MED ORDER — VITAMIN B-1 100 MG PO TABS
100.0000 mg | ORAL_TABLET | Freq: Every day | ORAL | Status: DC
  Administered 2012-06-08 – 2012-06-09 (×2): 100 mg via ORAL
  Filled 2012-06-08 (×2): qty 1

## 2012-06-08 MED ORDER — FOLIC ACID 1 MG PO TABS
1.0000 mg | ORAL_TABLET | Freq: Every day | ORAL | Status: DC
  Administered 2012-06-08 – 2012-06-09 (×2): 1 mg via ORAL
  Filled 2012-06-08 (×2): qty 1

## 2012-06-08 MED ORDER — ONDANSETRON HCL 4 MG PO TABS: 4.0000 mg | ORAL_TABLET | Freq: Three times a day (TID) | ORAL | Status: DC | PRN

## 2012-06-08 MED ORDER — ACETAMINOPHEN 325 MG PO TABS
650.0000 mg | ORAL_TABLET | ORAL | Status: DC | PRN
  Administered 2012-06-08 – 2012-06-09 (×2): 650 mg via ORAL
  Filled 2012-06-08 (×2): qty 2

## 2012-06-08 MED ORDER — LORAZEPAM 1 MG PO TABS: 0.0000 mg | ORAL_TABLET | Freq: Two times a day (BID) | ORAL | Status: DC

## 2012-06-08 MED ORDER — LORAZEPAM 1 MG PO TABS
1.0000 mg | ORAL_TABLET | Freq: Four times a day (QID) | ORAL | Status: DC | PRN
  Filled 2012-06-08: qty 1

## 2012-06-08 MED ORDER — DEXTROSE 50 % IV SOLN: 50.0000 mL | Freq: Once | INTRAVENOUS | Status: DC

## 2012-06-08 MED ORDER — LORAZEPAM 1 MG PO TABS
0.0000 mg | ORAL_TABLET | Freq: Four times a day (QID) | ORAL | Status: DC
  Administered 2012-06-08 – 2012-06-09 (×3): 1 mg via ORAL
  Filled 2012-06-08 (×2): qty 1

## 2012-06-08 MED ORDER — LORAZEPAM 2 MG/ML IJ SOLN: 1.0000 mg | Freq: Four times a day (QID) | INTRAMUSCULAR | Status: DC | PRN

## 2012-06-08 MED ORDER — NICOTINE 21 MG/24HR TD PT24
21.0000 mg | MEDICATED_PATCH | Freq: Every day | TRANSDERMAL | Status: DC
  Filled 2012-06-08: qty 1

## 2012-06-08 MED ORDER — THIAMINE HCL 100 MG/ML IJ SOLN: 100.0000 mg | Freq: Every day | INTRAMUSCULAR | Status: DC

## 2012-06-08 MED ORDER — ADULT MULTIVITAMIN W/MINERALS CH
1.0000 | ORAL_TABLET | Freq: Every day | ORAL | Status: DC
  Administered 2012-06-08 – 2012-06-09 (×2): 1 via ORAL
  Filled 2012-06-08 (×2): qty 1

## 2012-06-08 NOTE — ED Notes (Signed)
Pt ambulated to room #34 accompanied by 2 NTs with chart and personal belongings.

## 2012-06-08 NOTE — ED Notes (Signed)
EDP at bedside  
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
## 2012-06-08 NOTE — ED Notes (Signed)
Pt. and belongings wanded by security 
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
## 2012-06-08 NOTE — ED Notes (Signed)
CBG 110. 
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
## 2012-06-08 NOTE — ED Notes (Signed)
Pt in CT when this writer attempted to obtain EKG

## 2012-06-08 NOTE — Progress Notes (Signed)
CM spoke with pt who confirms self pay [facility] resident with no pcp. CM discussed written information for self pay pcps, importance of pcp for f/u care, www.needymeds.org, discounted pharmacies, MATCH program and other [place] resources such as financial assistance, DSS and  health department Reviewed Health connect number to assist with finding self pay provider close to pt's residence. Reviewed resources for [place] self pay pcps like [facility], family medicine at [address], [facility] family practice, general medical clinics, [facility] urgent care plus others, [facility] out patient [facility]ies, housing, affordable care act/health reform (deadline 06/12/12) and other resources in [employer]. Pt voiced understanding and appreciation of resources provided  Pt agreed to receive these written resources upon discharge along with his d/c instructions. BH RN informed and CM requested pt be given written information at d/c  Confirms he has not signed up for affordable care act marketplace States he will attempt to complete prior to 06/12/12  Pt inquired about something to eat Reports he was still hungry after eating lunch CM spoke with [facility] RN about request for something else to eat

## 2012-06-08 NOTE — ED Notes (Signed)
Pt given blue scrubs and red socks and instructed to change.

## 2012-06-08 NOTE — ED Notes (Signed)
Writer informed pt of the need for urine, for a sample per orders.  Pt is unable to stay awake to listen/adhere to said request.  RN informed.

## 2012-06-08 NOTE — ED Notes (Signed)
Faxed and called SOC for TelePsych.

## 2012-06-08 NOTE — ED Notes (Signed)
Patient transported to CT 

## 2012-06-08 NOTE — ED Provider Notes (Signed)
Telepsych Dr. Berlin Hun, recommend discharge. Reviewing his records showed that he was recently admitted for suicidal ideations. He came to the ED several times since then for suicidal ideations. He still says that he is suicidal currently. Will ask for second opinion with our psychiatrist this afternoon.   Richardean Canal, MD 06/08/12 [phone]

## 2012-06-09 DIAGNOSIS — F1010 Alcohol abuse, uncomplicated: Secondary | ICD-10-CM

## 2012-06-09 DIAGNOSIS — F1910 Other psychoactive substance abuse, uncomplicated: Secondary | ICD-10-CM

## 2012-06-09 DIAGNOSIS — F1410 Cocaine abuse, uncomplicated: Secondary | ICD-10-CM

## 2012-06-09 NOTE — Progress Notes (Signed)
CSW received referral from psychiatrist, who stated patient is psychiatrically stable for discharge and need for residential rehab programs. CSW met with pt at bedside to discuss with patient current csw needs. Pt shared that he is interested in residential rehab. CSW and pt discussed options. Pt plans to follow up with [facility]. Pt verbalized understanding of the process to get into a residential rehab program. Pt provided with intensive outpatient programs as well. Pt and csw also discussed shelters and the [facility]. Pt plans to follow up with the [facility] to assist with housing, showers, laundry, employment, and transportation. Pt and csw discussed outpatient psychiatric resources. Pt plans to follow up with [facility].   .No further Clinical Social Work needs, signing off.   Catha Gosselin, LCSWA  [phone] 06/09/2012 1036am

## 2012-06-09 NOTE — [facility] Suicide Risk Assessment (Signed)
Suicide Risk Assessment  Discharge Assessment     Demographic Factors:  Male, Adolescent or young adult, Low socioeconomic status, Living alone and Unemployed  Mental Status Per Nursing Assessment::   On Admission:     Current Mental Status by Physician: NA  Loss Factors: Loss of significant relationship, Legal issues and Financial problems/change in socioeconomic status  Historical Factors: Impulsivity and NA  Risk Reduction Factors:   Sense of responsibility to family, Religious beliefs about death and Positive social support  Continued Clinical Symptoms:  Alcohol/Substance Abuse/Dependencies  Cognitive Features That Contribute To Risk:  Closed-mindedness Polarized thinking    Suicide Risk:  Minimal: No identifiable suicidal ideation.  Patients presenting with no risk factors but with morbid ruminations; may be classified as minimal risk based on the severity of the depressive symptoms  Discharge Diagnoses:   AXIS I:  Substance Abuse and Substance Induced Mood Disorder AXIS II:  Deferred AXIS III:   Past Medical History  Diagnosis Date  . Heart murmur     Stated he had an abnormal heart reading  . GERD (gastroesophageal reflux disease)   . Polysubstance abuse   . Depression    AXIS IV:  economic problems, housing problems, occupational problems, other psychosocial or environmental problems, problems related to legal system/crime and problems with access to health care services AXIS V:  41-50 serious symptoms  Plan Of Care/Follow-up recommendations:  Activity:  as tolerated Diet:  regular  Is patient on multiple antipsychotic therapies at discharge:  No   Has Patient had three or more failed trials of antipsychotic monotherapy by history:  No  Recommended Plan for Multiple Antipsychotic Therapies: Not applicable   Neftali Thurow,JANARDHAHA R. 06/09/2012, 11:08 AM
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
## 2012-06-11 ENCOUNTER — Emergency Department (HOSPITAL_COMMUNITY)
Admission: EM | Admit: 2012-06-11 | Discharge: 2012-06-11 | Disposition: A | Discharge status: Home / Self Care | Payer: Self-pay | Attending: Emergency Medicine | Admitting: Emergency Medicine

## 2012-06-11 ENCOUNTER — Encounter (HOSPITAL_COMMUNITY): Payer: Self-pay | Admitting: Emergency Medicine

## 2012-06-11 DIAGNOSIS — Z8719 Personal history of other diseases of the digestive system: Secondary | ICD-10-CM | POA: Insufficient documentation

## 2012-06-11 DIAGNOSIS — F17200 Nicotine dependence, unspecified, uncomplicated: Secondary | ICD-10-CM | POA: Insufficient documentation

## 2012-06-11 DIAGNOSIS — K602 Anal fissure, unspecified: Secondary | ICD-10-CM

## 2012-06-11 DIAGNOSIS — R011 Cardiac murmur, unspecified: Secondary | ICD-10-CM | POA: Insufficient documentation

## 2012-06-11 DIAGNOSIS — Z8659 Personal history of other mental and behavioral disorders: Secondary | ICD-10-CM | POA: Insufficient documentation

## 2012-06-11 DIAGNOSIS — K6289 Other specified diseases of anus and rectum: Secondary | ICD-10-CM

## 2012-06-11 LAB — POCT I-STAT, CHEM 8
BUN: 8 mg/dL (ref 6–23)
Creatinine, Ser: 0.9 mg/dL (ref 0.50–1.35)
Hemoglobin: 16.7 g/dL (ref 13.0–17.0)
Potassium: 4 mEq/L (ref 3.5–5.1)
Sodium: 140 mEq/L (ref 135–145)
TCO2: 31 mmol/L (ref 0–100)

## 2012-06-11 MED ORDER — HYDROCORTISONE ACETATE 25 MG RE SUPP: 25.0000 mg | Freq: Two times a day (BID) | RECTAL | Status: DC

## 2012-06-11 MED ORDER — HYDROCODONE-ACETAMINOPHEN 5-325 MG PO TABS: 1.0000 | ORAL_TABLET | ORAL | Status: DC | PRN

## 2012-06-11 MED ORDER — HYDROCODONE-ACETAMINOPHEN 5-325 MG PO TABS
1.0000 | ORAL_TABLET | Freq: Once | ORAL | Status: AC
  Administered 2012-06-11: 1 via ORAL
  Filled 2012-06-11: qty 1

## 2012-06-11 NOTE — ED Provider Notes (Signed)
History     CSN: [id number]  Arrival date & time 06/11/12  0906   First MD Initiated Contact with Patient 06/11/12 1120      Chief Complaint  Patient presents with  . Rectal Pain     The history is provided by the patient.   patient for several months ago restart with some constipation.  Now he reports increasing rectal pain over the past 3-4 days with what he describes as green stool.  No blood in the stool.  He reports some discomfort when he coughs and discomfort with bowel movements themselves.  He states sometimes he wipes there is a very small amount of blood on the tissue itself.  No prior history of rectal complaints.  She denies rectal intercourse.  It is our prior history of cocaine abuse but states is no longer using.  Pain is mild to moderate in severity at this time.  No fevers or chills.  No other complaints.  Past Medical History  Diagnosis Date  . Heart murmur     Stated he had an abnormal heart reading  . GERD (gastroesophageal reflux disease)   . Polysubstance abuse   . Depression     Past Surgical History  Procedure Laterality Date  . Hand surgery    . Fracture surgery  2013    rt hand    No family history on file.  History  Substance Use Topics  . Smoking status: Current Every Day Smoker -- 1.00 packs/day for 6 years    Types: Cigarettes  . Smokeless tobacco: Not on file  . Alcohol Use: Yes     Comment: 9 to 10 40's daily      Review of Systems  All other systems reviewed and are negative.    Allergies  Review of patient's allergies indicates no known allergies.  Home Medications   Current Outpatient Rx  Name  Route  Sig  Dispense  Refill  . HYDROcodone-acetaminophen (NORCO/VICODIN) 5-325 MG per tablet   Oral   Take 1 tablet by mouth every 4 (four) hours as needed for pain.   8 tablet   0   . hydrocortisone (ANUSOL-HC) 25 MG suppository   Rectal   Place 1 suppository (25 mg total) rectally 2 (two) times daily.   12 suppository  0     BP 137/92  Pulse 87  Temp(Src) 97.5 F (36.4 C) (Oral)  Resp 18  SpO2 97%  Physical Exam  Nursing note and vitals reviewed. Constitutional: He is oriented to person, place, and time. He appears well-developed and well-nourished.  HENT:  Head: Normocephalic.  Eyes: EOM are normal.  Neck: Normal range of motion.  Pulmonary/Chest: Effort normal.  Abdominal: He exhibits no distension.  Genitourinary:  Small anal fissure noted at approximately 6:00.  [place] a small internal hemorrhoid at 6 PM as well.  No perianal signs of infection.  No obvious perianal abscess.   Musculoskeletal: Normal range of motion.  Neurological: He is alert and oriented to person, place, and time.  Psychiatric: He has a normal mood and affect.    ED Course  Procedures (including critical care time)  Labs Reviewed  POCT I-STAT, CHEM 8 - Abnormal; Notable for the following:    Glucose, Bld 100 (*)    All other components within normal limits   No results found.   1. Rectal pain   2. Anal fissure       MDM  Likely small anal fissure.  Small internal hemorrhoid palpated.  Home with suppositories and instructions for warm water soaks. Doubt pararectal abscess based on examination         Lyanne Co, MD 06/11/12 1154

## 2012-06-11 NOTE — ED Notes (Signed)
C/o rectal pain, blood when wiping, and green stool x 3 days.  Last BM yesterday.  Reports problems with constipation x 4 months.

## 2012-12-17 ENCOUNTER — Emergency Department (HOSPITAL_COMMUNITY)
Admission: EM | Admit: 2012-12-17 | Discharge: 2012-12-17 | Disposition: A | Discharge status: Other Acute Inpatient Hospital | Payer: Self-pay | Attending: Emergency Medicine | Admitting: Emergency Medicine

## 2012-12-17 ENCOUNTER — Encounter (HOSPITAL_COMMUNITY): Payer: Self-pay | Admitting: *Deleted

## 2012-12-17 ENCOUNTER — Inpatient Hospital Stay (HOSPITAL_COMMUNITY)
Admission: EM | Admit: 2012-12-17 | Discharge: 2012-12-20 | DRG: 897 | Disposition: A | Discharge status: Home / Self Care | Payer: No Typology Code available for payment source | Source: Intra-hospital | Attending: Psychiatry | Admitting: Psychiatry

## 2012-12-17 ENCOUNTER — Encounter (HOSPITAL_COMMUNITY): Payer: Self-pay | Admitting: Emergency Medicine

## 2012-12-17 DIAGNOSIS — F17200 Nicotine dependence, unspecified, uncomplicated: Secondary | ICD-10-CM | POA: Insufficient documentation

## 2012-12-17 DIAGNOSIS — F1994 Other psychoactive substance use, unspecified with psychoactive substance-induced mood disorder: Secondary | ICD-10-CM

## 2012-12-17 DIAGNOSIS — F3289 Other specified depressive episodes: Secondary | ICD-10-CM | POA: Insufficient documentation

## 2012-12-17 DIAGNOSIS — F1420 Cocaine dependence, uncomplicated: Secondary | ICD-10-CM | POA: Diagnosis present

## 2012-12-17 DIAGNOSIS — F329 Major depressive disorder, single episode, unspecified: Secondary | ICD-10-CM | POA: Insufficient documentation

## 2012-12-17 DIAGNOSIS — R45851 Suicidal ideations: Secondary | ICD-10-CM

## 2012-12-17 DIAGNOSIS — R011 Cardiac murmur, unspecified: Secondary | ICD-10-CM | POA: Insufficient documentation

## 2012-12-17 DIAGNOSIS — Z79899 Other long term (current) drug therapy: Secondary | ICD-10-CM | POA: Insufficient documentation

## 2012-12-17 DIAGNOSIS — IMO0002 Reserved for concepts with insufficient information to code with codable children: Secondary | ICD-10-CM | POA: Insufficient documentation

## 2012-12-17 DIAGNOSIS — Z5987 Material hardship due to limited financial resources, not elsewhere classified: Secondary | ICD-10-CM

## 2012-12-17 DIAGNOSIS — F1020 Alcohol dependence, uncomplicated: Principal | ICD-10-CM | POA: Diagnosis present

## 2012-12-17 DIAGNOSIS — K2920 Alcoholic gastritis without bleeding: Secondary | ICD-10-CM | POA: Insufficient documentation

## 2012-12-17 DIAGNOSIS — Z598 Other problems related to housing and economic circumstances: Secondary | ICD-10-CM

## 2012-12-17 DIAGNOSIS — F1410 Cocaine abuse, uncomplicated: Secondary | ICD-10-CM

## 2012-12-17 DIAGNOSIS — K219 Gastro-esophageal reflux disease without esophagitis: Secondary | ICD-10-CM | POA: Diagnosis present

## 2012-12-17 LAB — URINALYSIS, ROUTINE W REFLEX MICROSCOPIC
Glucose, UA: NEGATIVE mg/dL
Ketones, ur: 15 mg/dL — AB
Leukocytes, UA: NEGATIVE
Nitrite: NEGATIVE
Protein, ur: NEGATIVE mg/dL
Urobilinogen, UA: 0.2 mg/dL (ref 0.0–1.0)

## 2012-12-17 LAB — CBC WITH DIFFERENTIAL/PLATELET
Hemoglobin: 15.4 g/dL (ref 13.0–17.0)
Lymphocytes Relative: 11 % — ABNORMAL LOW (ref 12–46)
Lymphs Abs: 1.8 10*3/uL (ref 0.7–4.0)
MCV: 85 fL (ref 78.0–100.0)
Monocytes Relative: 10 % (ref 3–12)
Neutrophils Relative %: 79 % — ABNORMAL HIGH (ref 43–77)
Platelets: 271 10*3/uL (ref 150–400)
RBC: 5.07 MIL/uL (ref 4.22–5.81)
WBC: 16.4 10*3/uL — ABNORMAL HIGH (ref 4.0–10.5)

## 2012-12-17 LAB — ACETAMINOPHEN LEVEL: Acetaminophen (Tylenol), Serum: <15 ug/mL (ref 10–30)

## 2012-12-17 LAB — COMPREHENSIVE METABOLIC PANEL
ALT: 19 U/L (ref 0–53)
Alkaline Phosphatase: 80 U/L (ref 39–117)
BUN: 6 mg/dL (ref 6–23)
CO2: 30 mEq/L (ref 19–32)
GFR calc Af Amer: >90 mL/min (ref >90)
GFR calc non Af Amer: >90 mL/min (ref >90)
Glucose, Bld: 74 mg/dL (ref 70–99)
Potassium: 3.8 mEq/L (ref 3.5–5.1)
Sodium: 140 mEq/L (ref 135–145)
Total Bilirubin: 1 mg/dL (ref 0.3–1.2)

## 2012-12-17 LAB — ETHANOL: Alcohol, Ethyl (B): 25 mg/dL — ABNORMAL HIGH (ref 0–11)

## 2012-12-17 LAB — RAPID URINE DRUG SCREEN, HOSP PERFORMED: Barbiturates: NOT DETECTED

## 2012-12-17 LAB — SALICYLATE LEVEL: Salicylate Lvl: <2 mg/dL — ABNORMAL LOW (ref 2.8–20.0)

## 2012-12-17 MED ORDER — FOLIC ACID 1 MG PO TABS
1.0000 mg | ORAL_TABLET | Freq: Every day | ORAL | Status: DC
  Administered 2012-12-18 – 2012-12-20 (×3): 1 mg via ORAL
  Filled 2012-12-17 (×4): qty 1

## 2012-12-17 MED ORDER — CHLORDIAZEPOXIDE HCL 25 MG PO CAPS
25.0000 mg | ORAL_CAPSULE | Freq: Four times a day (QID) | ORAL | Status: AC
  Administered 2012-12-17 – 2012-12-18 (×6): 25 mg via ORAL
  Filled 2012-12-17 (×6): qty 1

## 2012-12-17 MED ORDER — VITAMIN B-1 100 MG PO TABS
100.0000 mg | ORAL_TABLET | Freq: Every day | ORAL | Status: DC
  Filled 2012-12-17: qty 1

## 2012-12-17 MED ORDER — LORAZEPAM 1 MG PO TABS: 1.0000 mg | ORAL_TABLET | Freq: Four times a day (QID) | ORAL | Status: DC | PRN

## 2012-12-17 MED ORDER — VITAMIN B-1 100 MG PO TABS
100.0000 mg | ORAL_TABLET | Freq: Every day | ORAL | Status: DC
  Administered 2012-12-18 – 2012-12-20 (×3): 100 mg via ORAL
  Filled 2012-12-17 (×4): qty 1

## 2012-12-17 MED ORDER — ALUM & MAG HYDROXIDE-SIMETH 200-200-20 MG/5ML PO SUSP: 30.0000 mL | ORAL | Status: DC | PRN

## 2012-12-17 MED ORDER — ONDANSETRON HCL 4 MG PO TABS: 4.0000 mg | ORAL_TABLET | Freq: Three times a day (TID) | ORAL | Status: DC | PRN

## 2012-12-17 MED ORDER — CHLORDIAZEPOXIDE HCL 25 MG PO CAPS
25.0000 mg | ORAL_CAPSULE | ORAL | Status: DC
  Administered 2012-12-20: 25 mg via ORAL
  Filled 2012-12-17: qty 1

## 2012-12-17 MED ORDER — GI COCKTAIL ~~LOC~~
30.0000 mL | Freq: Once | ORAL | Status: AC
  Administered 2012-12-17: 30 mL via ORAL
  Filled 2012-12-17: qty 30

## 2012-12-17 MED ORDER — ADULT MULTIVITAMIN W/MINERALS CH
1.0000 | ORAL_TABLET | Freq: Every day | ORAL | Status: DC
  Administered 2012-12-18 – 2012-12-20 (×3): 1 via ORAL
  Filled 2012-12-17 (×4): qty 1

## 2012-12-17 MED ORDER — ACETAMINOPHEN 325 MG PO TABS
650.0000 mg | ORAL_TABLET | Freq: Four times a day (QID) | ORAL | Status: DC | PRN
  Filled 2012-12-17: qty 2

## 2012-12-17 MED ORDER — HYDROXYZINE HCL 25 MG PO TABS
25.0000 mg | ORAL_TABLET | Freq: Four times a day (QID) | ORAL | Status: DC | PRN
  Administered 2012-12-17: 25 mg via ORAL
  Filled 2012-12-17: qty 1

## 2012-12-17 MED ORDER — SODIUM CHLORIDE 0.9 % IV BOLUS (SEPSIS)
1000.0000 mL | INTRAVENOUS | Status: AC
  Administered 2012-12-17: 1000 mL via INTRAVENOUS

## 2012-12-17 MED ORDER — THIAMINE HCL 100 MG/ML IJ SOLN: 100.0000 mg | Freq: Once | INTRAMUSCULAR | Status: DC

## 2012-12-17 MED ORDER — ONDANSETRON 4 MG PO TBDP: 4.0000 mg | ORAL_TABLET | Freq: Four times a day (QID) | ORAL | Status: DC | PRN

## 2012-12-17 MED ORDER — CHLORDIAZEPOXIDE HCL 25 MG PO CAPS
25.0000 mg | ORAL_CAPSULE | Freq: Three times a day (TID) | ORAL | Status: AC
  Administered 2012-12-19 (×3): 25 mg via ORAL
  Filled 2012-12-17 (×3): qty 1

## 2012-12-17 MED ORDER — LOPERAMIDE HCL 2 MG PO CAPS: 2.0000 mg | ORAL_CAPSULE | ORAL | Status: DC | PRN

## 2012-12-17 MED ORDER — LORAZEPAM 2 MG/ML IJ SOLN: 1.0000 mg | Freq: Four times a day (QID) | INTRAMUSCULAR | Status: DC | PRN

## 2012-12-17 MED ORDER — VITAMIN B-1 100 MG PO TABS
100.0000 mg | ORAL_TABLET | Freq: Every day | ORAL | Status: DC
  Administered 2012-12-17: 14:00:00 100 mg via ORAL
  Filled 2012-12-17: qty 1

## 2012-12-17 MED ORDER — MAGNESIUM HYDROXIDE 400 MG/5ML PO SUSP: 30.0000 mL | Freq: Every day | ORAL | Status: DC | PRN

## 2012-12-17 MED ORDER — FOLIC ACID 1 MG PO TABS
1.0000 mg | ORAL_TABLET | Freq: Every day | ORAL | Status: DC
  Administered 2012-12-17: 1 mg via ORAL
  Filled 2012-12-17 (×2): qty 1

## 2012-12-17 MED ORDER — THIAMINE HCL 100 MG/ML IJ SOLN: 100.0000 mg | Freq: Every day | INTRAMUSCULAR | Status: DC

## 2012-12-17 MED ORDER — ADULT MULTIVITAMIN W/MINERALS CH
1.0000 | ORAL_TABLET | Freq: Every day | ORAL | Status: DC
  Administered 2012-12-17: 1 via ORAL
  Filled 2012-12-17: qty 1

## 2012-12-17 MED ORDER — CHLORDIAZEPOXIDE HCL 25 MG PO CAPS: 25.0000 mg | ORAL_CAPSULE | Freq: Every day | ORAL | Status: DC

## 2012-12-17 MED ORDER — CHLORDIAZEPOXIDE HCL 25 MG PO CAPS
25.0000 mg | ORAL_CAPSULE | Freq: Four times a day (QID) | ORAL | Status: DC | PRN
  Administered 2012-12-17: 25 mg via ORAL
  Filled 2012-12-17: qty 1

## 2012-12-17 MED ORDER — ADULT MULTIVITAMIN W/MINERALS CH: 1.0000 | ORAL_TABLET | Freq: Every day | ORAL | Status: DC

## 2012-12-17 NOTE — Progress Notes (Signed)
Patient did attend the evening speaker AA meeting.  

## 2012-12-17 NOTE — ED Notes (Signed)
Patient comfortable in room. Set up telepsych monitor for [facility]. Interview being conducted at this time.

## 2012-12-17 NOTE — ED Notes (Signed)
Left with [facility] at 1612. Calm, cooperative.

## 2012-12-17 NOTE — BH Assessment (Signed)
[facility] Assessment Progress Note Received call from [facility] requesting tele assessment.  Appt scheduled with ED secretary for 1430.

## 2012-12-17 NOTE — Tx Team (Signed)
Initial Interdisciplinary Treatment Plan  PATIENT STRENGTHS: (choose at least two) Ability for insight Active sense of humor Average or above average intelligence Communication skills General fund of knowledge Religious Affiliation  PATIENT STRESSORS: Educational concerns Health problems Loss of girlfriend Substance abuse   PROBLEM LIST: Problem List/Patient Goals Date to be addressed Date deferred Reason deferred Estimated date of resolution  Substance abuse 12-17-12   dc  Suicidal Ideation 12-17-12   dc  Depression 12-17-12   dc                                       DISCHARGE CRITERIA:  Ability to meet basic life and health needs Improved stabilization in mood, thinking, and/or behavior Motivation to continue treatment in a less acute level of care Reduction of life-threatening or endangering symptoms to within safe limits Verbal commitment to aftercare and medication compliance  PRELIMINARY DISCHARGE PLAN: Attend aftercare/continuing care group Attend 12-step recovery group Outpatient therapy Placement in alternative living arrangements Return to previous work or school arrangements  PATIENT/FAMIILY INVOLVEMENT: This treatment plan has been presented to and reviewed with the patient, Rad Gramling, and/or family member, .  The patient and family have been given the opportunity to ask questions and make suggestions.  Caty Tessler 12/17/2012, 6:00 PM

## 2012-12-17 NOTE — Progress Notes (Signed)
Patient ID: Adam [name], male   DOB: Jul 21, 1985, 27 y.o.   MRN: [id number] Patient admitted to [facility] unit, requesting detox. Patient states '' I've been drinking and depressed and tried to kill myself using cocaine . I've been drinking 8 40 oz beers a day '' Patient upon admission denies SI and is able to contract to safety. Pt presents anxious, depressed, with blunted affect. Pt reports long history of depression, susbtance abuse stating ''I've done this before I know what I was supposed to do, I've even been through [place] program '' Pt skin search completed , no contraband found. Patient oriented to unit. LOW fall risk. Will continue to monitor q 15 minutes.

## 2012-12-17 NOTE — ED Provider Notes (Signed)
CSN: [id number]     Arrival date & time 12/17/12  1478 History   First MD Initiated Contact with Patient 12/17/12 1007     Chief Complaint  Patient presents with  . Suicidal   (Consider location/radiation/quality/duration/timing/severity/associated sxs/prior Treatment) Patient is a 27 y.o. male presenting with mental health disorder and abdominal pain. The history is provided by the patient.  Mental Health Problem Presenting symptoms: suicidal thoughts   Degree of incapacity (severity):  Mild Onset quality:  Gradual Timing:  Intermittent Progression:  Unchanged Chronicity:  New Context: alcohol use and drug abuse   Treatment compliance:  Untreated Relieved by:  Nothing Associated symptoms: abdominal pain   Associated symptoms: no chest pain and no headaches   Abdominal Pain Pain location:  LUQ Pain quality: aching   Pain radiates to:  Does not radiate Pain severity:  Mild Onset quality:  Gradual Timing:  Intermittent Progression:  Unchanged Chronicity:  Chronic Context: alcohol use   Relieved by:  Nothing Worsened by:  Nothing tried Ineffective treatments:  None tried Associated symptoms: vomiting (twice last night)   Associated symptoms: no chest pain, no cough, no diarrhea, no dysuria, no fever, no hematuria, no nausea and no shortness of breath     Past Medical History  Diagnosis Date  . Heart murmur     Stated he had an abnormal heart reading  . GERD (gastroesophageal reflux disease)   . Polysubstance abuse   . Depression    Past Surgical History  Procedure Laterality Date  . Hand surgery     No family history on file. History  Substance Use Topics  . Smoking status: Current Every Day Smoker -- 1.00 packs/day for 6 years    Types: Cigarettes  . Smokeless tobacco: Not on file  . Alcohol Use: Yes     Comment: 8 40's daily    Review of Systems  Constitutional: Negative for fever.  HENT: Negative for rhinorrhea, drooling and neck pain.   Eyes: Negative  for pain.  Respiratory: Negative for cough and shortness of breath.   Cardiovascular: Negative for chest pain and leg swelling.  Gastrointestinal: Positive for vomiting (twice last night) and abdominal pain. Negative for nausea and diarrhea.  Genitourinary: Negative for dysuria and hematuria.  Musculoskeletal: Negative for gait problem.  Skin: Negative for color change.  Neurological: Negative for numbness and headaches.  Hematological: Negative for adenopathy.  Psychiatric/Behavioral: Positive for suicidal ideas. Negative for behavioral problems.  All other systems reviewed and are negative.    Allergies  Review of patient's allergies indicates no known allergies.  Home Medications   Current Outpatient Rx  Name  Route  Sig  Dispense  Refill  . clotrimazole (LOTRIMIN) 1 % cream   Topical   Apply 1 application topically 2 (two) times daily.         Marland Kitchen HYDROcodone-acetaminophen (NORCO/VICODIN) 5-325 MG per tablet   Oral   Take 1 tablet by mouth every 4 (four) hours as needed for pain.   8 tablet   0   . hydrocortisone (ANUSOL-HC) 25 MG suppository   Rectal   Place 1 suppository (25 mg total) rectally 2 (two) times daily.   12 suppository   0    BP 124/76  Pulse 86  Temp(Src) 97.8 F (36.6 C) (Oral)  Resp 18  SpO2 95% Physical Exam  Nursing note and vitals reviewed. Constitutional: He is oriented to person, place, and time. He appears well-developed and well-nourished.  HENT:  Head: Normocephalic and atraumatic.  Right Ear: External ear normal.  Left Ear: External ear normal.  Nose: Nose normal.  Mouth/Throat: Oropharynx is clear and moist. No oropharyngeal exudate.  Eyes: Conjunctivae and EOM are normal. Pupils are equal, round, and reactive to light.  Neck: Normal range of motion. Neck supple.  Cardiovascular: Normal rate, regular rhythm, normal heart sounds and intact distal pulses.  Exam reveals no gallop and no friction rub.   No murmur  heard. Pulmonary/Chest: Effort normal and breath sounds normal. No respiratory distress. He has no wheezes.  Abdominal: Soft. Bowel sounds are normal. He exhibits no distension. There is tenderness (mild ttp of LUQ). There is no rebound and no guarding.  Musculoskeletal: Normal range of motion. He exhibits no edema and no tenderness.  Neurological: He is alert and oriented to person, place, and time.  Skin: Skin is warm and dry.  Psychiatric: He has a normal mood and affect. His behavior is normal.    ED Course  Procedures (including critical care time) Labs Review Labs Reviewed  CBC WITH DIFFERENTIAL - Abnormal; Notable for the following:    WBC 16.4 (*)    Neutrophils Relative % 79 (*)    Neutro Abs 12.9 (*)    Lymphocytes Relative 11 (*)    Monocytes Absolute 1.7 (*)    All other components within normal limits  URINE RAPID DRUG SCREEN (HOSP PERFORMED) - Abnormal; Notable for the following:    Cocaine POSITIVE (*)    All other components within normal limits  SALICYLATE LEVEL - Abnormal; Notable for the following:    Salicylate Lvl <2.0 (*)    All other components within normal limits  [name] - Abnormal; Notable for the following:    Alcohol, Ethyl (B) 25 (*)    All other components within normal limits  URINALYSIS, ROUTINE W REFLEX MICROSCOPIC - Abnormal; Notable for the following:    APPearance CLOUDY (*)    Hgb urine dipstick TRACE (*)    Ketones, ur 15 (*)    All other components within normal limits  COMPREHENSIVE METABOLIC PANEL  LIPASE, BLOOD  ACETAMINOPHEN LEVEL  URINE MICROSCOPIC-ADD ON   Imaging Review No results found.  MDM   1. Alcohol dependence   2. Suicidal thoughts   3. Alcoholic gastritis    10:22 AM 27 y.o. male who presents with a request for detox from alcohol. The patient states that he has been drinking for proximally 10 years. He drinks about eight 40 oz alcoholic drinks per day. He last drank several hours ago. He also abuses cocaine and  last used yesterday evening. He states that he was feeling suicidal last night but denies this now. He is afraid if he is discharged that he will hurt himself. He states that he does have a history of trying to hurt himself in the past. He states he has tried to drink himself to death and also ran out in front of traffic. The patient is afebrile and vital signs are unremarkable here. He notes that he has been having upper abdominal pain after drinking for the last several months. He complains of left upper quadrant pain on exam. His abdomen is soft and benign. Will get screening labwork, IV fluid, GI cocktail.  Suspect alcohol gastritis as pt feeling better on exam now and wanting to eat. Will place in psych hold. Consult ACT for eval. CIWA protocol.   3:18 PM Pt is medically cleared. Pt accepted to [facility] by Dr. Dub Mikes. Will transfer.   Junius Argyle, MD  12/17/12 1559 

## 2012-12-17 NOTE — ED Notes (Signed)
Telepsych ACT consult completed

## 2012-12-17 NOTE — ED Notes (Signed)
Pt arrived to ED with c/o suicidal thoughts and wanting alcohol detox. Pt denies any plans of hurting self but is having thoughts of suicide. Pt stated that he is having abdominal pain that goes to the sides of his back. Pain started after he started after he started drinking a lot.

## 2012-12-17 NOTE — BH Assessment (Signed)
Tele Assessment Note   Adam [name] is an 27 y.o. male that was assessed this day after presenting to [facility] reporting SI with plan to step into traffic or to die from alcohol poisoning.  Pt denies HI or psychosis.  Pt stated his girlfriend stated she was going to kick him out of the home if he didn't get help.  Pt stated his side hurts from drinking, and he knows he needs to stop.  Pt reported he was incarcerated for having a false prescription given to him and was in jail for 3 mos.  Pt got out in July of this year and has been drinking and using crack cocaine daily since then.  Pt has a long history of alcohol and crack use.  Pt reported he has been drinking 8 40 oz beers per day, last use this morning.  Pt stated he had 8 40 oz beers and 3 shots of liquor.  Pt stated he has been smoking $200 worth of crack cocaine daily, alst use was $100 last night.  Pt endorses sx of depression.  Pt stated current withdrawal sx are headache and feeling like he is about to start sweating.  Pt denies hx of seizures.  Pt has hd no previous outpatient MH/SA treatment.  Pt is not on any psychotropic medications.  Pt pleasant, calm, cooperative and wants help with his SI and with detox.  Consulted with Shelda Jakes, PA-C, who accepted pt to Dr. Dub Mikes to bed 301-2.  Updated EDP Harrison @ [phone].  Mariya, MHT, completing support paperwork with pt and pt to be transported to [facility] via security.  ED staff and TTS staff updated once tele assessment completed.  Axis I: 303.90 Alcohol Dependence, 304.20 Cocaine Dependence, Substance Induced Mood Disorder Axis II: Deferred Axis III:  Past Medical History  Diagnosis Date  . Heart murmur     Stated he had an abnormal heart reading  . GERD (gastroesophageal reflux disease)   . Polysubstance abuse   . Depression    Axis IV: economic problems, occupational problems, other psychosocial or environmental problems, problems related to legal system/crime, problems related to social  environment, problems with access to health care services and problems with primary support group Axis V: 21-30 behavior considerably influenced by delusions or hallucinations OR serious impairment in judgment, communication OR inability to function in almost all areas  Past Medical History:  Past Medical History  Diagnosis Date  . Heart murmur     Stated he had an abnormal heart reading  . GERD (gastroesophageal reflux disease)   . Polysubstance abuse   . Depression     Past Surgical History  Procedure Laterality Date  . Hand surgery      Family History: No family history on file.  Social History:  reports that he has been smoking Cigarettes.  He has a 6 pack-year smoking history. He does not have any smokeless tobacco history on file. He reports that  drinks alcohol. He reports that he uses illicit drugs ("Crack" cocaine and Cocaine).  Additional Social History:  Alcohol / Drug Use Pain Medications: see MAR Prescriptions: see MAR Over the Counter: see MAR History of alcohol / drug use?: Yes Longest period of sobriety (when/how long): 3 months Negative Consequences of Use: Financial;Legal;Personal relationships;Work / Science writer Symptoms: Patient aware of relationship between substance abuse and physical/medical complications Substance #1 Name of Substance 1: ETOH 1 - Age of First Use: teens 1 - Amount (size/oz): 8-9 40 oz beers 1 - Frequency:  daily 1 - Duration: ongoing 1 - Last Use / Amount: This morning - 8 40 oz beers and 3 shots liquor Substance #2 Name of Substance 2: Crack cocaine 2 - Age of First Use: 20's 2 - Amount (size/oz): $200 2 - Frequency: daily 2 - Duration: ongoing 2 - Last Use / Amount: 12/16/12-$200  CIWA: CIWA-Ar BP: 149/70 mmHg Pulse Rate: 100 Nausea and Vomiting: no nausea and no vomiting Tactile Disturbances: none Tremor: no tremor Auditory Disturbances: not present Paroxysmal Sweats: barely perceptible sweating, palms moist Visual  Disturbances: not present Anxiety: mildly anxious Headache, Fullness in Head: none present Agitation: normal activity Orientation and Clouding of Sensorium: oriented and can do serial additions CIWA-Ar Total: 2 COWS:    Allergies: No Known Allergies  Home Medications:  (Not in a hospital admission)  OB/GYN Status:  No LMP for male patient.  General Assessment Data Location of Assessment: [facility] ED Is this a Tele or Face-to-Face Assessment?: Tele Assessment Is this an Initial Assessment or a Re-assessment for this encounter?: Initial Assessment Living Arrangements: Non-relatives/Friends (with girlfriend) Can pt return to current living arrangement?: Yes Admission Status: Voluntary Is patient capable of signing voluntary admission?: Yes Transfer from: Acute Hospital Referral Source: Self/Family/Friend  Medical Screening Exam [facility] Walk-in ONLY) Medical Exam completed: No (pt med cleared at [facility]) Reason for MSE not completed: Other: (pt med cleared at [facility])  [facility] Crisis Care Plan Living Arrangements: Non-relatives/Friends (with girlfriend) Name of Psychiatrist: none Name of Therapist: none  Education Status Is patient currently in school?: No  Risk to self Suicidal Ideation: Yes-Currently Present Suicidal Intent: Yes-Currently Present Is patient at risk for suicide?: Yes Suicidal Plan?: Yes-Currently Present Specify Current Suicidal Plan: to step into traffic or kill self by alcohol poisoning Access to Means: Yes Specify Access to Suicidal Means: can walk into traffic and has access to alcohol What has been your use of drugs/alcohol within the last 12 months?: pt admits to daily alcohol and crack cocaine use Previous Attempts/Gestures: Yes How many times?: 1 (pt has stepped in front of vehicle in traffic before) Other Self Harm Risks: pt denies Triggers for Past Attempts: Other (Comment) (Depression, SA) Intentional Self Injurious Behavior: Damaging Comment - Self Injurious  Behavior: ongoing SA Family Suicide History: No Recent stressful life event(s): Conflict (Comment);Financial Problems;Legal Issues;Recent negative physical changes (SA, legal, SI, financial) Persecutory voices/beliefs?: No Depression: Yes Depression Symptoms: Despondent;Insomnia;Fatigue;Loss of interest in usual pleasures;Feeling worthless/self pity Substance abuse history and/or treatment for substance abuse?: Yes Suicide prevention information given to non-admitted patients: Not applicable  Risk to Others Homicidal Ideation: No Thoughts of Harm to Others: No Current Homicidal Intent: No Current Homicidal Plan: No Access to Homicidal Means: No Identified Victim: pt denies History of harm to others?: No Assessment of Violence: None Noted Violent Behavior Description: na - pt calm, cooperative Does patient have access to weapons?: No Criminal Charges Pending?: Yes Describe Pending Criminal Charges: stealing, tampering with vehicle (pt stated these charges are to be dropped) Does patient have a court date: Yes Court Date: 01/05/13 (11/6, 12/1 - other court dates scheduled per pt)  Psychosis Hallucinations: None noted Delusions: None noted  Mental Status Report Appear/Hygiene: Disheveled Eye Contact: Good Motor Activity: Freedom of movement;Unremarkable Speech: Logical/coherent;Soft Level of Consciousness: Alert Mood: Depressed Affect: Appropriate to circumstance Anxiety Level: None Thought Processes: Coherent;Relevant Judgement: Unimpaired Orientation: Person;Place;Time;Situation;Appropriate for developmental age Obsessive Compulsive Thoughts/Behaviors: None  Cognitive Functioning Concentration: Decreased Memory: Recent Intact;Remote Intact IQ: Average Insight: Poor Impulse Control: Poor Appetite: Poor  Weight Loss: 5 Weight Gain: 0 Sleep: Decreased Total Hours of Sleep:  (4-5 hrs per night) Vegetative Symptoms: None  ADLScreening [facility] Assessment  Services) Patient's cognitive ability adequate to safely complete daily activities?: Yes Patient able to express need for assistance with ADLs?: Yes Independently performs ADLs?: Yes (appropriate for developmental age)  Prior Inpatient Therapy Prior Inpatient Therapy: Yes Prior Therapy Dates: 2014 Prior Therapy Facilty/Provider(s): [facility] Reason for Treatment: SI/SA  Prior Outpatient Therapy Prior Outpatient Therapy: No Prior Therapy Dates: na Prior Therapy Facilty/Provider(s): na Reason for Treatment: na  ADL Screening (condition at time of admission) Patient's cognitive ability adequate to safely complete daily activities?: Yes Is the patient deaf or have difficulty hearing?: No Does the patient have difficulty seeing, even when wearing glasses/contacts?: No Does the patient have difficulty concentrating, remembering, or making decisions?: No Patient able to express need for assistance with ADLs?: Yes Does the patient have difficulty dressing or bathing?: No Independently performs ADLs?: Yes (appropriate for developmental age) Does the patient have difficulty walking or climbing stairs?: No  Home Assistive Devices/Equipment Home Assistive Devices/Equipment: None    Abuse/Neglect Assessment (Assessment to be complete while patient is alone) Physical Abuse: Denies Verbal Abuse: Denies Sexual Abuse: Denies Exploitation of patient/patient's resources: Denies Self-Neglect: Denies Values / Beliefs Cultural Requests During Hospitalization: None Spiritual Requests During Hospitalization: None Consults Spiritual Care Consult Needed: No Social Work Consult Needed: No Merchant navy officer (For Healthcare) Advance Directive: Patient does not have advance directive;Patient would not like information    Additional Information 1:1 In Past 12 Months?: No CIRT Risk: No Elopement Risk: No Does patient have medical clearance?: Yes     Disposition:  Disposition Initial Assessment  Completed for this Encounter: Yes Disposition of Patient: Inpatient treatment program Type of inpatient treatment program: Adult (Pt accepted [facility])  Caryl Comes 12/17/2012 3:32 PM

## 2012-12-18 DIAGNOSIS — F1420 Cocaine dependence, uncomplicated: Secondary | ICD-10-CM

## 2012-12-18 DIAGNOSIS — F329 Major depressive disorder, single episode, unspecified: Secondary | ICD-10-CM

## 2012-12-18 DIAGNOSIS — F1010 Alcohol abuse, uncomplicated: Secondary | ICD-10-CM

## 2012-12-18 NOTE — Progress Notes (Signed)
Adult Psychoeducational Group Note  Date:  12/18/2012 Time:  11:10 AM  Group Topic/Focus:  Wellness Toolbox:   The focus of this group is to discuss various aspects of wellness, balancing those aspects and exploring ways to increase the ability to experience wellness.  Patients will create a wellness toolbox for use upon discharge.  Participation Level:  Active  Participation Quality:  Appropriate and Attentive  Affect:  Appropriate  Cognitive:  Alert and Appropriate  Insight: Appropriate  Engagement in Group:  Engaged  Modes of Intervention:  Discussion  Additional Comments:  Pt was very engaged and active during group session. Pt discussed some of his goals which included environmental, emotional, physical, spiritual and intellectual goals.   [place] Shi K 12/18/2012, 11:10 AM

## 2012-12-18 NOTE — [facility] Group Notes (Signed)
[facility] LCSW Aftercare Discharge Planning Group Note   12/18/2012 9:34 AM  Participation Quality:  Appropriate   Mood/Affect:  Anxious   Depression Rating:  1  Anxiety Rating:  8  Thoughts of Suicide:  No Will you contract for safety?   NA  Current AVH:  No  Plan for Discharge/Comments:  Pt states that he was living with his ex girlfriend who had kicked him out due to substance abuse issues. Pt reporting severe abdominal cramps and headache due to withdrawals. He does not report seeing any mental health providers currently and was living in [facility] [place]). Pt hoping for o/p treatment but is open to talking about i/p options if available.   Transportation Means: unknown at this time.   Supports: none indentified at this time. "my parents are sick themselves. They pray for me. But that's about it.   Smart, [employer]

## 2012-12-18 NOTE — Progress Notes (Signed)
Patient ID: Adam [name], male   DOB: 08-10-1985, 27 y.o.   MRN: [id number] D. Patient presents with anxious mood, affect blunted. Patient states ''I'm still feeling shaky, and yeah my abdomen still is hurting off and on . But I got some sleep last night '' Patient completed self inventory and continues to endorse depression, rating self at 5/10 on depression 10 being worst depression 1 being least. Patient denies any SI/HI. A. Support and encouragement provided.Medications administered as ordered.  Discussed above information with MD/treatment team. R. Pt has been visible in the milieu, attending unit programming, and pt requests follow up with inpatient facility , if possible. No further voiced concerns at this time. Will continue to monitor q 15 minutes for safety.

## 2012-12-18 NOTE — Progress Notes (Signed)
D.  Pt pleasant on approach, denies complaints at the time of assessment.  Watching TV in dayroom with peers.  Denies SI/HI/hallucinations of any kind.  Interacting appropriately with peers in milieu.  A.  Support and encouragement offered  R.  Pt remains safe on unit, will continue to monitor.

## 2012-12-18 NOTE — [facility] Suicide Risk Assessment (Signed)
[facility] INPATIENT:  Family/Significant Other Suicide Prevention Education  Suicide Prevention Education:  Patient Refusal for Family/Significant Other Suicide Prevention Education: The patient Adam [name] has refused to provide written consent for family/significant other to be provided Family/Significant Other Suicide Prevention Education during admission and/or prior to discharge.  Physician notified.  Pt provided with SPI pamphlet and encouraged to ask questions, talk about concerns, and share information with his support network. SPE completed with pt.   Smart, Adam [name] 12/18/2012, 2:34 PM

## 2012-12-18 NOTE — H&P (Signed)
Psychiatric Admission Assessment Adult  Patient Identification:  Adam [name] Date of Evaluation:  12/18/2012 Chief Complaint:  Alcohol Dependence History of Present Illness: Patient is an 27 y.o. AA male that presented to [facility] reporting SI with plan to step into traffic or to die from alcohol poisoning. Patient reports that his girlfriend threatened to kick him out of the house if he did not seek help for his alcohol and drug use. Pt denies HI or psychosis.  Pt stated his side hurts from drinking, and he knows he needs to stop. Pt reported he was incarcerated for having a false prescription given to him and was in jail for 3 mos. Pt got out in July of this year and has been drinking and using crack cocaine daily since then. Pt has a long history of alcohol and crack use. Pt reported he has been drinking 8 40 oz beers per day, last use this morning. Pt stated he had 8 40 oz beers and 3 shots of liquor. Pt stated he has been smoking $200 worth of crack cocaine daily, alst use was $100 last night. Pt endorses sx of depression.  Pt denies hx of seizures. Pt has hd no previous outpatient MH/SA treatment. Pt is not on any psychotropic medications. Pt pleasant, calm, cooperative and wants help with his SI and with detox. Patient denies history of seizures or DT`s when withdrawing from alcohol.  He reports he lets the alcohol sweat out of his body. Denies any withdrawal symptoms this morning. Interested in obtaining long term rehab after his pending cases are taken care of. Elements:  Location:  Adult inpatient [facility]. Quality:  alcohol intoxication, suiicidal thoughts. Severity:  suicidal [plan to step into traffic. Timing:  1 week. Duration:  chronic. Context:  homelessness. Associated Signs/Synptoms: Depression Symptoms:  depressed mood, psychomotor agitation, feelings of worthlessness/guilt, suicidal thoughts with specific plan, insomnia, loss of energy/fatigue, (Hypo) Manic Symptoms:  denies Anxiety  Symptoms:  Excessive Worry, Psychotic Symptoms:  denies PTSD Symptoms: denies  Psychiatric Specialty Exam: Physical Exam  Review of Systems  Constitutional: Positive for diaphoresis.  HENT: Negative.   Eyes: Negative.   Respiratory: Negative.   Cardiovascular: Negative.   Gastrointestinal: Positive for abdominal pain.  Genitourinary: Negative.   Musculoskeletal: Negative.   Skin: Negative.   Neurological: Negative.   Endo/Heme/Allergies: Negative.   Psychiatric/Behavioral: Positive for depression and substance abuse. The patient is nervous/anxious.     Blood pressure 149/91, pulse 96, temperature 97.8 F (36.6 C), temperature source Oral, resp. rate 16, height 5\' 9"  (1.753 m), weight 79.833 kg (176 lb), SpO2 96.00%.Body mass index is 25.98 kg/(m^2).  General Appearance: Casual  Eye Contact::  Fair  Speech:  Garbled  Volume:  Decreased  Mood:  Depressed and Dysphoric  Affect:  Constricted and Depressed  Thought Process:  Circumstantial  Orientation:  Full (Time, Place, and Person)  Thought Content:  Rumination  Suicidal Thoughts:  No  Homicidal Thoughts:  No  Memory:  Immediate;   Fair Recent;   Fair Remote;   Fair  Judgement:  Impaired  Insight:  Shallow  Psychomotor Activity:  Normal  Concentration:  Fair  Recall:  Fair  Akathisia:  No  Handed:  Right  AIMS (if indicated):     Assets:  Communication Skills Desire for Improvement Social Support  Sleep:  Number of Hours: 6    Past Psychiatric History: Diagnosis:Alcohol dependence, Depressive d/o nos, Cocaine dependence  Hospitalizations:once previously  Outpatient Care:none  Substance Abuse Care:[facility] for 2 years  Self-Mutilation:denies  Suicidal Attempts:8 months ago, tried to step in front of a car  Violent Behaviors:denies   Past Medical History:   Past Medical History  Diagnosis Date  . Heart murmur     Stated he had an abnormal heart reading  . GERD (gastroesophageal reflux disease)   .  Polysubstance abuse   . Depression     Allergies:  No Known Allergies PTA Medications: No prescriptions prior to admission    Previous Psychotropic Medications:  Medication/Dose    Lexapro- patient did not feel good             Substance Abuse History in the last 12 months:  yes  Consequences of Substance Abuse: Medical Consequences:  abdominal pain Legal Consequences:  several cases pending Family Consequences:  conflict with girl friend  Social History:  reports that he has been smoking Cigarettes.  He has a 6 pack-year smoking history. He does not have any smokeless tobacco history on file. He reports that  drinks alcohol. He reports that he uses illicit drugs ("Crack" cocaine and Cocaine). Additional Social History: Pain Medications: no Prescriptions: no History of alcohol / drug use?: Yes Longest period of sobriety (when/how long): 2 and half years at and after [facility] Name of Substance 1: alcohol 1 - Age of First Use: 50yrs 1 - Amount (size/oz): unsure 1 - Frequency: daily for three years 1 - Duration: three years 1 - Last Use / Amount: 8-40 oz beers Name of Substance 2: crack cocaine 2 - Age of First Use: 13 2 - Amount (size/oz): 100 dollars a day 2 - Frequency: daily 2 - Duration: three yrs                Current Place of Residence:   Place of Birth:   Family Members: Marital Status:  Single Children:  Sons:  Daughters: Relationships: Education:  GED Educational Problems/Performance: Religious Beliefs/Practices: History of Abuse (Emotional/Phsycial/Sexual) Occupational Experiences; Military History:  Media planner History: Hobbies/Interests:  Family History:  History reviewed. No pertinent family history.  Results for orders placed during the hospital encounter of 12/17/12 (from the past 72 hour(s))  CBC WITH DIFFERENTIAL     Status: Abnormal   Collection Time    12/17/12 10:29 AM      Result Value Range   WBC 16.4 (*) 4.0 - 10.5 K/uL    RBC 5.07  4.22 - 5.81 MIL/uL   Hemoglobin 15.4  13.0 - 17.0 g/dL   HCT 24.4  01.0 - 27.2 %   MCV 85.0  78.0 - 100.0 fL   MCH 30.4  26.0 - 34.0 pg   MCHC 35.7  30.0 - 36.0 g/dL   RDW 53.6  64.4 - 03.4 %   Platelets 271  150 - 400 K/uL   Neutrophils Relative % 79 (*) 43 - 77 %   Neutro Abs 12.9 (*) 1.7 - 7.7 K/uL   Lymphocytes Relative 11 (*) 12 - 46 %   Lymphs Abs 1.8  0.7 - 4.0 K/uL   Monocytes Relative 10  3 - 12 %   Monocytes Absolute 1.7 (*) 0.1 - 1.0 K/uL   Eosinophils Relative 0  0 - 5 %   Eosinophils Absolute 0.0  0.0 - 0.7 K/uL   Basophils Relative 0  0 - 1 %   Basophils Absolute 0.0  0.0 - 0.1 K/uL  COMPREHENSIVE METABOLIC PANEL     Status: None   Collection Time    12/17/12 10:29 AM  Result Value Range   Sodium 140  135 - 145 mEq/L   Potassium 3.8  3.5 - 5.1 mEq/L   Chloride 98  96 - 112 mEq/L   CO2 30  19 - 32 mEq/L   Glucose, Bld 74  70 - 99 mg/dL   BUN 6  6 - 23 mg/dL   Creatinine, Ser 1.61  0.50 - 1.35 mg/dL   Calcium 9.1  8.4 - 09.6 mg/dL   Total Protein 8.1  6.0 - 8.3 g/dL   Albumin 3.8  3.5 - 5.2 g/dL   AST 24  0 - 37 U/L   ALT 19  0 - 53 U/L   Alkaline Phosphatase 80  39 - 117 U/L   Total Bilirubin 1.0  0.3 - 1.2 mg/dL   GFR calc non Af Amer >90  >90 mL/min   GFR calc Af Amer >90  >90 mL/min   Comment: (NOTE)     The eGFR has been calculated using the CKD EPI equation.     This calculation has not been validated in all clinical situations.     eGFR's persistently <90 mL/min signify possible Chronic Kidney     Disease.  LIPASE, BLOOD     Status: None   Collection Time    12/17/12 10:29 AM      Result Value Range   Lipase 15  11 - 59 U/L  ACETAMINOPHEN LEVEL     Status: None   Collection Time    12/17/12 10:29 AM      Result Value Range   Acetaminophen (Tylenol), Serum <15.0  10 - 30 ug/mL   Comment:            THERAPEUTIC CONCENTRATIONS VARY     SIGNIFICANTLY. A RANGE OF 10-30     ug/mL MAY BE AN EFFECTIVE     CONCENTRATION FOR MANY  PATIENTS.     HOWEVER, SOME ARE BEST TREATED     AT CONCENTRATIONS OUTSIDE THIS     RANGE.     ACETAMINOPHEN CONCENTRATIONS     >150 ug/mL AT 4 HOURS AFTER     INGESTION AND >50 ug/mL AT 12     HOURS AFTER INGESTION ARE     OFTEN ASSOCIATED WITH TOXIC     REACTIONS.  SALICYLATE LEVEL     Status: Abnormal   Collection Time    12/17/12 10:29 AM      Result Value Range   Salicylate Lvl <2.0 (*) 2.8 - 20.0 mg/dL  [name]     Status: Abnormal   Collection Time    12/17/12 10:29 AM      Result Value Range   Alcohol, Ethyl (B) 25 (*) 0 - 11 mg/dL   Comment:            LOWEST DETECTABLE LIMIT FOR     SERUM ALCOHOL IS 11 mg/dL     FOR MEDICAL PURPOSES ONLY  URINE RAPID DRUG SCREEN (HOSP PERFORMED)     Status: Abnormal   Collection Time    12/17/12 11:20 AM      Result Value Range   Opiates NONE DETECTED  NONE DETECTED   Cocaine POSITIVE (*) NONE DETECTED   Benzodiazepines NONE DETECTED  NONE DETECTED   Amphetamines NONE DETECTED  NONE DETECTED   Tetrahydrocannabinol NONE DETECTED  NONE DETECTED   Barbiturates NONE DETECTED  NONE DETECTED   Comment:            DRUG SCREEN FOR MEDICAL PURPOSES  ONLY.  IF CONFIRMATION IS NEEDED     FOR ANY PURPOSE, NOTIFY LAB     WITHIN 5 DAYS.                LOWEST DETECTABLE LIMITS     FOR URINE DRUG SCREEN     Drug Class       Cutoff (ng/mL)     Amphetamine      1000     Barbiturate      200     Benzodiazepine   200     Tricyclics       300     Opiates          300     Cocaine          300     THC              50  URINALYSIS, ROUTINE W REFLEX MICROSCOPIC     Status: Abnormal   Collection Time    12/17/12 11:20 AM      Result Value Range   Color, Urine YELLOW  YELLOW   APPearance CLOUDY (*) CLEAR   Specific Gravity, Urine 1.021  1.005 - 1.030   pH 5.0  5.0 - 8.0   Glucose, UA NEGATIVE  NEGATIVE mg/dL   Hgb urine dipstick TRACE (*) NEGATIVE   Bilirubin Urine NEGATIVE  NEGATIVE   Ketones, ur 15 (*) NEGATIVE mg/dL   Protein, ur  NEGATIVE  NEGATIVE mg/dL   Urobilinogen, UA 0.2  0.0 - 1.0 mg/dL   Nitrite NEGATIVE  NEGATIVE   Leukocytes, UA NEGATIVE  NEGATIVE  URINE MICROSCOPIC-ADD ON     Status: None   Collection Time    12/17/12 11:20 AM      Result Value Range   Urine-Other AMORPHOUS URATES/PHOSPHATES     Psychological Evaluations:  Assessment:   DSM5:  Schizophrenia Disorders:  none Obsessive-Compulsive Disorders:  denies Trauma-Stressor Disorders:  denies Substance/Addictive Disorders:  Alcohol Related Disorder - Severe (303.90) Depressive Disorders:  Major Depressive Disorder - Moderate (296.22)  AXIS I:  Alcohol Abuse and Depressive Disorder NOS, Cocaine dependence AXIS II:  Deferred AXIS III:   Past Medical History  Diagnosis Date  . Heart murmur     Stated he had an abnormal heart reading  . GERD (gastroesophageal reflux disease)   . Polysubstance abuse   . Depression    AXIS IV:  economic problems, housing problems, occupational problems and problems related to legal system/crime AXIS V:  41-50 serious symptoms  Treatment Plan/Recommendations:  Detox protocol for alcohol withdrawal. Patient not interested in starting an antidepressant medication for his mood. Individual and group therapy. Patient interested in long term rehab for his substance abuse after he clears up cases he has pending.  Treatment Plan Summary: Daily contact with patient to assess and evaluate symptoms and progress in treatment Medication management Current Medications:  Current Facility-Administered Medications  Medication Dose Route Frequency Provider Last Rate Last Dose  . acetaminophen (TYLENOL) tablet 650 mg  650 mg Oral Q6H PRN Verne Spurr, PA-C      . alum & mag hydroxide-simeth (MAALOX/MYLANTA) 200-200-20 MG/5ML suspension 30 mL  30 mL Oral Q4H PRN Verne Spurr, PA-C      . chlordiazePOXIDE (LIBRIUM) capsule 25 mg  25 mg Oral Q6H PRN Verne Spurr, PA-C   25 mg at 12/17/12 2223  . chlordiazePOXIDE  (LIBRIUM) capsule 25 mg  25 mg Oral QID Verne Spurr, PA-C   25 mg at 12/18/12 0840  Followed by  . [START ON 12/19/2012] chlordiazePOXIDE (LIBRIUM) capsule 25 mg  25 mg Oral TID Verne Spurr, PA-C       Followed by  . [START ON 12/20/2012] chlordiazePOXIDE (LIBRIUM) capsule 25 mg  25 mg Oral BH-qamhs Verne Spurr, PA-C       Followed by  . [START ON 12/21/2012] chlordiazePOXIDE (LIBRIUM) capsule 25 mg  25 mg Oral Daily Verne Spurr, PA-C      . folic acid (FOLVITE) tablet 1 mg  1 mg Oral Daily Verne Spurr, PA-C   1 mg at 12/18/12 0840  . hydrOXYzine (ATARAX/VISTARIL) tablet 25 mg  25 mg Oral Q6H PRN Verne Spurr, PA-C   25 mg at 12/17/12 2223  . loperamide (IMODIUM) capsule 2-4 mg  2-4 mg Oral PRN Verne Spurr, PA-C      . LORazepam (ATIVAN) tablet 1 mg  1 mg Oral Q6H PRN Verne Spurr, PA-C       Or  . LORazepam (ATIVAN) injection 1 mg  1 mg Intravenous Q6H PRN Verne Spurr, PA-C      . magnesium hydroxide (MILK OF MAGNESIA) suspension 30 mL  30 mL Oral Daily PRN Verne Spurr, PA-C      . multivitamin with minerals tablet 1 tablet  1 tablet Oral Daily Verne Spurr, PA-C   1 tablet at 12/18/12 0840  . ondansetron (ZOFRAN) tablet 4 mg  4 mg Oral Q8H PRN Verne Spurr, PA-C      . ondansetron (ZOFRAN-ODT) disintegrating tablet 4 mg  4 mg Oral Q6H PRN Verne Spurr, PA-C      . thiamine (B-1) injection 100 mg  100 mg Intramuscular Once [employer], PA-C      . thiamine (VITAMIN B-1) tablet 100 mg  100 mg Oral Daily Verne Spurr, PA-C   100 mg at 12/18/12 0840    Observation Level/Precautions:  15 minute checks  Laboratory:  Per admission orders  Psychotherapy:  Individual and group  Medications:  As needed  Consultations:  As needed  Discharge Concerns:  Safety and stabilization  Estimated LOS:3-4 days  Other:     I certify that inpatient services furnished can reasonably be expected to improve the patient's condition.   Gerhard Rappaport 10/6/201410:28 AM

## 2012-12-18 NOTE — [facility] Suicide Risk Assessment (Signed)
Suicide Risk Assessment  Admission Assessment     Nursing information obtained from:  Patient Demographic factors:  Male;Adolescent or young adult;Low socioeconomic status Current Mental Status:  Suicidal ideation indicated by patient Loss Factors:  Decrease in vocational status;Loss of significant relationship;Financial problems / change in socioeconomic status Historical Factors:  Impulsivity Risk Reduction Factors:  Positive coping skills or problem solving skills  CLINICAL FACTORS:   Depression:   Comorbid alcohol abuse/dependence Hopelessness Impulsivity Insomnia Severe Alcohol/Substance Abuse/Dependencies  COGNITIVE FEATURES THAT CONTRIBUTE TO RISK:  Thought constriction (tunnel vision)    SUICIDE RISK:   Mild:  Suicidal ideation of limited frequency, intensity, duration, and specificity.  There are no identifiable plans, no associated intent, mild dysphoria and related symptoms, good self-control (both objective and subjective assessment), few other risk factors, and identifiable protective factors, including available and accessible social support.  PLAN OF CARE: Initiate detox protocol. Individual and group therapy. Plan for discharge when stable.  I certify that inpatient services furnished can reasonably be expected to improve the patient's condition.  Dinah Lupa 12/18/2012, 10:48 AM

## 2012-12-18 NOTE — [facility] Group Notes (Signed)
[facility] LCSW Group Therapy  12/18/2012 2:45 PM  Type of Therapy:  Group Therapy  Participation Level:  None  Participation Quality:  Drowsy  Affect:  Lethargic  Cognitive:  Lacking  Insight:  None  Engagement in Therapy:  None  Modes of Intervention:  Discussion, Education, Exploration, Socialization and Support  Summary of Progress/Problems: Today's Topic: Overcoming Obstacles. Adam [name] immediately fell asleep when group commenced. He slept quietly throughout group's entirety. He does not appear to be making progress in the group setting at this time. [name] stated that the medications he had taken have been making him extremely lethargic during the day.   [name], Adam Harbuck 12/18/2012, 2:45 PM

## 2012-12-18 NOTE — Tx Team (Signed)
Interdisciplinary Treatment Plan Update (Adult)  Date: 12/18/2012   Time Reviewed: 11:37 AM  Progress in Treatment:  Attending groups: Yes Participating in groups: Yes   Taking medication as prescribed: Yes  Tolerating medication: Yes  Family/Significant othe contact made: Not yet. SPE required for this pt.   Patient understands diagnosis: Yes, AEB seeking treatment for Si, depression, ETOH detox, and other substance abuse (crack).  Discussing patient identified problems/goals with staff: Yes  Medical problems stabilized or resolved: Yes  Denies suicidal/homicidal ideation: Yes during group/self report.  Patient has not harmed self or Others: Yes  New problem(s) identified: n/a  Discharge Plan or Barriers: Pt hoping for o/p therapy and followup in the [facility] area. CSW assessing for appropriate referrals. Pt reports at least 4 pending court dates.  Additional comments:Adam [name] is an 27 y.o. male that was assessed this day after presenting to [facility] reporting SI with plan to step into traffic or to die from alcohol poisoning. Pt denies HI or psychosis. Pt stated his girlfriend stated she was going to kick him out of the home if he didn't get help. Pt stated his side hurts from drinking, and he knows he needs to stop. Pt reported he was incarcerated for having a false prescription given to him and was in jail for 3 mos. Pt got out in July of this year and has been drinking and using crack cocaine daily since then. Pt has a long history of alcohol and crack use. Pt reported he has been drinking 8 40 oz beers per day, last use this morning. Pt stated he had 8 40 oz beers and 3 shots of liquor. Pt stated he has been smoking $200 worth of crack cocaine daily, alst use was $100 last night. Pt endorses sx of depression. Pt stated current withdrawal sx are headache and feeling like he is about to start sweating. Pt denies hx of seizures. Pt has hd no previous outpatient MH/SA treatment. Pt is not on any  psychotropic medications. Pt pleasant, calm, cooperative and wants help with his SI and with detox.   Reason for Continuation of Hospitalization: Librium taper-withdrawals Mood stabilization Estimated length of stay: 2-3 days  For review of initial/current patient goals, please see plan of care.  Attendees:  Patient:    Family:    Physician: Dr. Daleen Bo MD 12/18/2012 11:36 AM   Nursing: Mardella Layman RN  12/18/2012 11:36 AM   Clinical Social Worker Niaja Stickley Smart, LCSWA  12/18/2012 11:37 AM   Other: Jan RN  12/18/2012 11:37 AM   Other:    Other: Darden Dates Nurse CM  12/18/2012 11:37 AM   Other:    Scribe for Treatment Team:  [employer] LCSWA 12/18/2012 11:37 AM
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
## 2012-12-19 DIAGNOSIS — F1994 Other psychoactive substance use, unspecified with psychoactive substance-induced mood disorder: Secondary | ICD-10-CM

## 2012-12-19 DIAGNOSIS — F1410 Cocaine abuse, uncomplicated: Secondary | ICD-10-CM

## 2012-12-19 NOTE — [facility] Group Notes (Signed)
[facility] LCSW Group Therapy  12/19/2012 1:33 PM  Type of Therapy:  Group Therapy  Participation Level:  None  Participation Quality:  Drowsy  Affect:  Lethargic  Cognitive:  Lacking  Insight:  None  Engagement in Therapy:  None  Modes of Intervention:  Discussion, Education, Exploration, Socialization and Support  Summary of Progress/Problems: [name] came to talk about his personal journey with substance abuse and addiction.[name] immediately closed his eyes and fell asleep when group began. He does not appear to be making progress in the group setting at this time due to his inability to remain engaged and attentive.    Smart, Adam [name] 12/19/2012, 1:33 PM

## 2012-12-19 NOTE — Progress Notes (Signed)
[facility] MD Progress Note  12/19/2012 1:50 PM Adam [name]  MRN:  [id number]  Subjective:  Adam [name] reports, "I still feeling some tremors and anxiety. I have heavily doing a lot of drugs and drinking a lot of alcohol. This time, I thought that I was dying because I was hurting so bad. My stomach is feeling much better".  Diagnosis:   DSM5: Schizophrenia Disorders:  NA Obsessive-Compulsive Disorders:  NA Trauma-Stressor Disorders:  NA Substance/Addictive Disorders:  Alcohol Related Disorder - Severe (303.90), Cocaine dependence Depressive Disorders:  Substance induced disorder.  Axis I: Alcohol Related Disorder - Severe (303.90), Cocaine dependence, Substance induced mood disorder Axis II: Deferred Axis III:  Past Medical History  Diagnosis Date  . Heart murmur     Stated he had an abnormal heart reading  . GERD (gastroesophageal reflux disease)   . Polysubstance abuse   . Depression    Axis IV: other psychosocial or environmental problems and Alcoholism, substance abuse issues Axis V: 41-50 serious symptoms  ADL's:  Fairly intact  Sleep: Fair  Appetite:  Fair  Suicidal Ideation:  Plan:  Denies Intent:  Denies Means:  Denies Homicidal Ideation:  Plan:  Denies Intent:  Denies Means:  Denies  AEB (as evidenced by):  Psychiatric Specialty Exam: Review of Systems  Constitutional: Negative.   HENT: Negative.   Eyes: Negative.   Respiratory: Negative.   Cardiovascular: Negative.   Gastrointestinal: Negative.   Genitourinary: Negative.   Musculoskeletal: Negative.   Skin: Negative.   Neurological: Negative.   Endo/Heme/Allergies: Negative.   Psychiatric/Behavioral: Positive for substance abuse (Alcohol, cocaine dependence). Negative for depression, suicidal ideas, hallucinations and memory loss. The patient is nervous/anxious.     Blood pressure 130/82, pulse 74, temperature 98.4 F (36.9 C), temperature source Oral, resp. rate 16, height 5\' 9"  (1.753 m), weight 79.833 kg  (176 lb), SpO2 96.00%.Body mass index is 25.98 kg/(m^2).  General Appearance: Casual and Fairly Groomed  Patent attorney::  Good  Speech:  Clear and Coherent  Volume:  Normal  Mood:  Anxious, Hopeless and Worthless  Affect:  Flat  Thought Process:  Coherent and Goal Directed  Orientation:  Full (Time, Place, and Person)  Thought Content:  Rumination  Suicidal Thoughts:  No  Homicidal Thoughts:  No  Memory:  Immediate;   Good Recent;   Good Remote;   Good  Judgement:  Fair  Insight:  Shallow  Psychomotor Activity:  Tremor  Concentration:  Fair  Recall:  Good  Akathisia:  No  Handed:  Right  AIMS (if indicated):     Assets:  Communication Skills Desire for Improvement Physical Health  Sleep:  Number of Hours: 5.75   Current Medications: Current Facility-Administered Medications  Medication Dose Route Frequency Provider Last Rate Last Dose  . acetaminophen (TYLENOL) tablet 650 mg  650 mg Oral Q6H PRN Verne Spurr, PA-C      . alum & mag hydroxide-simeth (MAALOX/MYLANTA) 200-200-20 MG/5ML suspension 30 mL  30 mL Oral Q4H PRN Verne Spurr, PA-C      . chlordiazePOXIDE (LIBRIUM) capsule 25 mg  25 mg Oral Q6H PRN Verne Spurr, PA-C   25 mg at 12/17/12 2223  . chlordiazePOXIDE (LIBRIUM) capsule 25 mg  25 mg Oral TID Verne Spurr, PA-C   25 mg at 12/19/12 1157   Followed by  . [START ON 12/20/2012] chlordiazePOXIDE (LIBRIUM) capsule 25 mg  25 mg Oral BH-qamhs Verne Spurr, PA-C       Followed by  . [START ON 12/21/2012] chlordiazePOXIDE (LIBRIUM)  capsule 25 mg  25 mg Oral Daily Verne Spurr, PA-C      . folic acid (FOLVITE) tablet 1 mg  1 mg Oral Daily Verne Spurr, PA-C   1 mg at 12/19/12 4782  . hydrOXYzine (ATARAX/VISTARIL) tablet 25 mg  25 mg Oral Q6H PRN Verne Spurr, PA-C   25 mg at 12/17/12 2223  . loperamide (IMODIUM) capsule 2-4 mg  2-4 mg Oral PRN Verne Spurr, PA-C      . LORazepam (ATIVAN) tablet 1 mg  1 mg Oral Q6H PRN Verne Spurr, PA-C       Or  . LORazepam  (ATIVAN) injection 1 mg  1 mg Intravenous Q6H PRN Verne Spurr, PA-C      . magnesium hydroxide (MILK OF MAGNESIA) suspension 30 mL  30 mL Oral Daily PRN Verne Spurr, PA-C      . multivitamin with minerals tablet 1 tablet  1 tablet Oral Daily Verne Spurr, PA-C   1 tablet at 12/19/12 0820  . ondansetron (ZOFRAN) tablet 4 mg  4 mg Oral Q8H PRN Verne Spurr, PA-C      . ondansetron (ZOFRAN-ODT) disintegrating tablet 4 mg  4 mg Oral Q6H PRN Verne Spurr, PA-C      . thiamine (B-1) injection 100 mg  100 mg Intramuscular Once [employer], PA-C      . thiamine (VITAMIN B-1) tablet 100 mg  100 mg Oral Daily Verne Spurr, PA-C   100 mg at 12/19/12 0820    Lab Results: No results found for this or any previous visit (from the past 48 hour(s)).  Physical Findings: AIMS: Facial and Oral Movements Muscles of Facial Expression: None, normal Lips and Perioral Area: None, normal Jaw: None, normal Tongue: None, normal,Extremity Movements Upper (arms, wrists, hands, fingers): None, normal Lower (legs, knees, ankles, toes): None, normal, Trunk Movements Neck, shoulders, hips: None, normal, Overall Severity Severity of abnormal movements (highest score from questions above): None, normal Incapacitation due to abnormal movements: None, normal Patient's awareness of abnormal movements (rate only patient's report): No Awareness, Dental Status Current problems with teeth and/or dentures?: No Does patient usually wear dentures?: No  CIWA:  CIWA-Ar Total: 0 COWS:  COWS Total Score: 2  Treatment Plan Summary: Daily contact with patient to assess and evaluate symptoms and progress in treatment Medication management  Plan: Supportive approach/coping skills/relapse prevention. Encouraged out of room, participation in group sessions and application of coping skills when distressed. Will continue to monitor response to/adverse effects of medications in use to assure effectiveness. Continue to monitor  mood, behavior and interaction with staff and other patients. Discharge plans in progress. Continue current plan of care.  Medical Decision Making Problem Points:  Review of last therapy session (1) and Review of psycho-social stressors (1) Data Points:  Review of medication regiment & side effects (2) Review of new medications or change in dosage (2)  I certify that inpatient services furnished can reasonably be expected to improve the patient's condition.   Armandina Stammer I, PMHNP-BC 12/19/2012, 1:50 PM

## 2012-12-19 NOTE — Progress Notes (Signed)
D: Patient denies SI/HI and A/V hallucinations; patient reports sleep is well; reports appetite is good ; reports energy level is high; reports ability to pay attention is good; rates depression as 0/10; rates hopelessness 0/10; rates anxiety as 0/10; patient denies withdrawal symptoms; patient reports that he wants to go home  A: Monitored q 15 minutes; patient encouraged to attend groups; patient educated about medications; patient given medications per physician orders; patient encouraged to express feelings and/or concerns  R: Patient is calm, cooperative, and appropriate; patient just wants to go home because his girlfriend will take him back; patient's interaction with staff and peers is approrpiate;; patient is taking medications as prescribed and tolerating medications; patient is attending all groups but not engaging

## 2012-12-19 NOTE — Progress Notes (Signed)
Adult Psychoeducational Group Note  Date:  12/19/2012 Time:  1:44 PM  Group Topic/Focus:  Recovery Goals:   The focus of this group is to identify appropriate goals for recovery and establish a plan to achieve them.  Participation Level:  Minimal  Participation Quality:  Inattentive  Affect:  Flat  Cognitive:  Alert  Insight: Good  Engagement in Group:  Lacking  Modes of Intervention:  Discussion, Exploration, Socialization and Support  Additional Comments:  Pt came to group and shared that drugs/alcohol and free time and his attitude are standing between him and recovery. Pt plans on changing this by going to another treatment facility and finding a hobby to take up his free time.   Cathlean Cower 12/19/2012, 1:44 PM

## 2012-12-19 NOTE — Progress Notes (Signed)
Recreation Therapy Notes  Date: 10.07.2014 Time: 3:00pm Location: [address] Dayroom  Group Topic: Communication, Team Building, Problem Solving  Goal Area(s) Addresses:  Patient will effectively work with peer towards shared goal.  Patient will identify skill used to make activity successful.  Patient will identify how skills used during activity can be used to reach post d/c goals.   Behavioral Response: Engaged, Redirectable, Sleeping  Intervention: Problem Solving Activitiy  Activity: Life Boat. Patients were given a scenario about being on a sinking yacht. Patients were informed the yacht included 15 guest, 8 of which could be placed on the life boat, along with all group members. Individuals on guest list were of varying socioeconomic classes such as a Education officer, museum, Materials engineer, Midwife, Tree surgeon.   Education: Customer service manager, Discharge Planning   Education Outcome: Acknowledges understanding  Clinical Observations/Feedback: Patient actively engaged group activity, voicing his opinion and debating with peers appropriately. Patient patient identify communication as a skill necessary to make activity successful, as well as what guided decision making during group session. Patient made no additional contributions to group discussion and it is unclear if patient was listening, as he slouched in seat and appeared to sleep.   Patient needed prompt to stop side conversation with male peer as group was starting. Patient tolerated redirection.   Marykay Lex Soha Thorup, LRT/CTRS  Jearl Klinefelter 12/19/2012 4:32 PM

## 2012-12-19 NOTE — Progress Notes (Signed)
Pt observed in his bed awake.  Pt says he is feeling ok and denies withdrawal symptoms at this time.  Pt denies SI/HI/AV.  Pt's only concern at the moment is that staff get his tennis shoes out of the locker so he can wash them before he is discharged.  Informed pt that we cannot get his shoes out of the locker at this time for that reason.   Pt started to become argumentative, but then settled down and was agreeable to wait until morning when administration was in the building.  Pt states he does not need any medications tonight.  Pt makes his needs known to staff.  Support and encouragement offered.  Safety maintained with q15 minute checks.

## 2012-12-19 NOTE — Progress Notes (Signed)
Recreation Therapy Notes  Date: 10.07.2014 Time: 2:30pm Location: [address] Dayroom  Group Topic: Animal Assisted Activities (AAA)  Behavioral Response: Did not attend.    Maryama Kuriakose L Gideon Burstein, LRT/CTRS  Maylen Waltermire L 12/19/2012 4:17 PM 

## 2012-12-19 NOTE — Progress Notes (Signed)
Pt reports he is doing ok this evening.  He denies SI/HI/AV.  He reports minimal withdrawal symptoms at this time.  Discussed scheduled meds with patient.  Pt attended AA groups this evening.  He is unsure what his discharge plans are.  Pt was encouraged to make his needs known to staff.  Support and encouragement offered.  Safety maintained with q15 minute checks.

## 2012-12-19 NOTE — Progress Notes (Signed)
The focus of this group is to educate the patient on the purpose and policies of crisis stabilization and provide a format to answer questions about their admission.  The group details unit policies and expectations of patients while admitted. Patient attended group and did not engage and not invested and wants to go home.

## 2012-12-20 DIAGNOSIS — F1020 Alcohol dependence, uncomplicated: Principal | ICD-10-CM

## 2012-12-20 NOTE — [facility] Group Notes (Signed)
[facility] LCSW Aftercare Discharge Planning Group Note   12/20/2012 9:12 AM  Participation Quality:  Appropriate   Mood/Affect:  Appropriate  Depression Rating:  0  Anxiety Rating:  2  Thoughts of Suicide:  No Will you contract for safety?   NA  Current AVH:  No  Plan for Discharge/Comments:  Pt reports that he is ready for d/c today. He plans to go to [facility] for med management and therapy. Pt also interested in [employer], [facility] groups. Pt plans to return to his girlfriends home after d/c.   Transportation Means: bus passes.   Supports: girlfriend/limited family supports.   Smart, [employer]

## 2012-12-20 NOTE — Progress Notes (Signed)
Patient ID: Adam [name], male   DOB: 02-23-1986, 27 y.o.   MRN: [id number] Patient discharged per physician order; patient denies SI/HI and A/V hallucinations; patient had no samples or prescriptions to receive; patient received copy of AVS and bus pass after it was reviewed; patient verbalized and signed that he received all the belongings; patient left the unit ambulatory

## 2012-12-20 NOTE — BHH Suicide Risk Assessment (Signed)
Suicide Risk Assessment  Discharge Assessment     Demographic Factors:  Male, Low socioeconomic status and Unemployed, [place]  Mental Status Per Nursing Assessment::   On Admission:  Suicidal ideation indicated by patient  Current Mental Status by Physician: Patient alert and oriented to 4. Speech normal in rate and volume. Denies aH/VH/SI/HI. Some insight and judgement.  Loss Factors: Legal issues and Financial problems/change in socioeconomic status  Historical Factors: Family history of mental illness or substance abuse and Impulsivity  Risk Reduction Factors:   Positive social support and Positive coping skills or problem solving skills  Continued Clinical Symptoms:  Alcohol/Substance Abuse/Dependencies  Cognitive Features That Contribute To Risk:  Cognitively intact    Suicide Risk:  Minimal: No identifiable suicidal ideation.  Patients presenting with no risk factors but with morbid ruminations; may be classified as minimal risk based on the severity of the depressive symptoms  Discharge Diagnoses:   AXIS I:  Alcohol Abuse AXIS II:  Deferred AXIS III:   Past Medical History  Diagnosis Date  . Heart murmur     Stated he had an abnormal heart reading  . GERD (gastroesophageal reflux disease)   . Polysubstance abuse   . Depression    AXIS IV:  economic problems, occupational problems and problems related to legal system/crime AXIS V:  61-70 mild symptoms  Plan Of Care/Follow-up recommendations:  Activity:  as tolerated Diet:  regular  Is patient on multiple antipsychotic therapies at discharge:  No   Has Patient had three or more failed trials of antipsychotic monotherapy by history:  No  Recommended Plan for Multiple Antipsychotic Therapies: NA  Camille Dragan 12/20/2012, 10:21 AM

## 2012-12-20 NOTE — Discharge Summary (Signed)
Physician Discharge Summary Note  Patient:  Adam [name] is an 27 y.o., male MRN:  [id number] DOB:  [birth year] Patient phone:  [phone] (home)  Patient address:   [address] [place] [address],   Date of Admission:  12/17/2012 Date of Discharge: 12/20/12  Reason for Admission: Alcohol detox  Discharge Diagnoses: Active Problems:   * No active hospital problems. *  Review of Systems  Constitutional: Negative.   HENT: Negative.   Eyes: Negative.   Respiratory: Negative.   Cardiovascular: Negative.   Gastrointestinal: Negative.   Genitourinary: Negative.   Musculoskeletal: Negative.   Skin: Negative.   Neurological: Negative.   Endo/Heme/Allergies: Negative.   Psychiatric/Behavioral: Positive for substance abuse (Alcoholism, Cocaine addiction). Negative for depression, suicidal ideas, hallucinations and memory loss. The patient is nervous/anxious (Stabi;ized with medication prior to discharge). The patient does not have insomnia.     DSM5: Schizophrenia Disorders:  NA Obsessive-Compulsive Disorders:  NA Trauma-Stressor Disorders:  NA Substance/Addictive Disorders:  Alcohol dependence Depressive Disorders:  NA  Axis Diagnosis:   AXIS I:  Alcohol dependence AXIS II:  Deferred AXIS III:   Past Medical History  Diagnosis Date  . Heart murmur     Stated he had an abnormal heart reading  . GERD (gastroesophageal reflux disease)   . Polysubstance abuse   . Depression    AXIS IV:  problems related to legal system/crime and Alcoholism AXIS V:  64  Level of Care:  OP  Hospital Course:  Patient is an 27 y.o. AA male that presented to [facility] reporting SI with plan to step into traffic or to die from alcohol poisoning. Patient reports that his girlfriend threatened to kick him out of the house if he did not seek help for his alcohol and drug use. Pt denies HI or psychosis. Pt stated his side hurts from drinking, and he knows he needs to stop. Pt reported he was incarcerated  for having a false prescription given to him and was in jail for 3 mos. Pt got out in July of this year and has been drinking and using crack cocaine daily since then. Pt has a long history of alcohol and crack use. Pt reported he has been drinking 8 40 oz beers per day, last use this morning. Pt stated he had 8 40 oz beers and 3 shots of liquor. Pt stated he has been smoking $200 worth of crack cocaine daily, alst use was $100 last night. Pt endorses sx of depression. Pt denies hx of seizures. Pt has hd no previous outpatient MH/SA treatment. Pt is not on any psychotropic medications.   Upon admission into this hospital, and after admission assessment/evaluation coupled with UDS/Toxicology reports, it was determined that Adam [name] will need detoxification treatment protocol to stabilize his system of alcohol intoxication and to combat the withdrawal symptoms as well.  Adam [name] was then started on Librium detoxification treatment protocols. He was also enrolled in group counseling sessions and activities to learn coping skills that should help him after discharge to cope better and manage his substance abuse issues to sustain a much longer sobriety. He also attended AA/NA meetings being offered and held on this unit. He presented no other previously existing and or identifiable medical conditions that required treatment and or monitoring.  However, he was monitored closely for any potential problems that may arise as a result of and or during detoxification treatment. Patient tolerated his treatment regimen and detoxification treatment protocol without any significant adverse effects and or  reactions presented. He was discharged  On no routine psychiatric/medical medications.  Patient attended treatment team meeting this am and met with the treatment team members. His reason for admission, present symptoms, substance abuse issues, response to treatment and discharge plans discussed. Patient endorsed that he  is doing well and stable for discharge to pursue the next phase of his substance abuse treatment. It was then agreed upon that he will follow-up care at the [facility] clinic here in [place], [place] between the hours of 08-09:00 am, Monday thru Friday. He has been instructed that this is a walk-in appointment. The address, time and contact information for this clinic provided for patient in writing.  Besides the treatments received here and scheduled outpatient psychiatric services , patient was encouraged to join/attend AA/NA meetings offered and held within his community for a much needed peer support.  It was agreed upon between patient and the team that she will be discharged to her home that he shares with girl-friend.  Upon discharge, patient adamantly denies suicidal, homicidal ideations, auditory, visual hallucinations, delusional thougts and or withdrawal symptoms. Patient left [name] with all personal belongings in no apparent distress. He received 2 weeks worth supply samples of his [facility] discharge medications provided by [facility]. Transportation per city bus. Bus pass provided by [facility].  Consults:  psychiatry  Significant Diagnostic Studies:  labs: CBC with diff, CMP, UDS, Toxicology tests, U/A  Discharge Vitals:   Blood pressure 154/90, pulse 83, temperature 97.6 F (36.4 C), temperature source Oral, resp. rate 18, height 5\' 9"  (1.753 m), weight 79.833 kg (176 lb), SpO2 96.00%. Body mass index is 25.98 kg/(m^2). Lab Results:   Results for orders placed during the hospital encounter of 12/17/12 (from the past 72 hour(s))  CBC WITH DIFFERENTIAL     Status: Abnormal   Collection Time    12/17/12 10:29 AM      Result Value Range   WBC 16.4 (*) 4.0 - 10.5 K/uL   RBC 5.07  4.22 - 5.81 MIL/uL   Hemoglobin 15.4  13.0 - 17.0 g/dL   HCT 30.8  65.7 - 84.6 %   MCV 85.0  78.0 - 100.0 fL   MCH 30.4  26.0 - 34.0 pg   MCHC 35.7  30.0 - 36.0 g/dL   RDW 96.2  95.2 - 84.1 %   Platelets 271  150 - 400  K/uL   Neutrophils Relative % 79 (*) 43 - 77 %   Neutro Abs 12.9 (*) 1.7 - 7.7 K/uL   Lymphocytes Relative 11 (*) 12 - 46 %   Lymphs Abs 1.8  0.7 - 4.0 K/uL   Monocytes Relative 10  3 - 12 %   Monocytes Absolute 1.7 (*) 0.1 - 1.0 K/uL   Eosinophils Relative 0  0 - 5 %   Eosinophils Absolute 0.0  0.0 - 0.7 K/uL   Basophils Relative 0  0 - 1 %   Basophils Absolute 0.0  0.0 - 0.1 K/uL  COMPREHENSIVE METABOLIC PANEL     Status: None   Collection Time    12/17/12 10:29 AM      Result Value Range   Sodium 140  135 - 145 mEq/L   Potassium 3.8  3.5 - 5.1 mEq/L   Chloride 98  96 - 112 mEq/L   CO2 30  19 - 32 mEq/L   Glucose, Bld 74  70 - 99 mg/dL   BUN 6  6 - 23 mg/dL   Creatinine, Ser 3.24  0.50 -  1.35 mg/dL   Calcium 9.1  8.4 - 96.0 mg/dL   Total Protein 8.1  6.0 - 8.3 g/dL   Albumin 3.8  3.5 - 5.2 g/dL   AST 24  0 - 37 U/L   ALT 19  0 - 53 U/L   Alkaline Phosphatase 80  39 - 117 U/L   Total Bilirubin 1.0  0.3 - 1.2 mg/dL   GFR calc non Af Amer >90  >90 mL/min   GFR calc Af Amer >90  >90 mL/min   Comment: (NOTE)     The eGFR has been calculated using the CKD EPI equation.     This calculation has not been validated in all clinical situations.     eGFR's persistently <90 mL/min signify possible Chronic Kidney     Disease.  LIPASE, BLOOD     Status: None   Collection Time    12/17/12 10:29 AM      Result Value Range   Lipase 15  11 - 59 U/L  ACETAMINOPHEN LEVEL     Status: None   Collection Time    12/17/12 10:29 AM      Result Value Range   Acetaminophen (Tylenol), Serum <15.0  10 - 30 ug/mL   Comment:            THERAPEUTIC CONCENTRATIONS VARY     SIGNIFICANTLY. A RANGE OF 10-30     ug/mL MAY BE AN EFFECTIVE     CONCENTRATION FOR MANY PATIENTS.     HOWEVER, SOME ARE BEST TREATED     AT CONCENTRATIONS OUTSIDE THIS     RANGE.     ACETAMINOPHEN CONCENTRATIONS     >150 ug/mL AT 4 HOURS AFTER     INGESTION AND >50 ug/mL AT 12     HOURS AFTER INGESTION ARE     OFTEN  ASSOCIATED WITH TOXIC     REACTIONS.  SALICYLATE LEVEL     Status: Abnormal   Collection Time    12/17/12 10:29 AM      Result Value Range   Salicylate Lvl <2.0 (*) 2.8 - 20.0 mg/dL  [name]     Status: Abnormal   Collection Time    12/17/12 10:29 AM      Result Value Range   Alcohol, Ethyl (B) 25 (*) 0 - 11 mg/dL   Comment:            LOWEST DETECTABLE LIMIT FOR     SERUM ALCOHOL IS 11 mg/dL     FOR MEDICAL PURPOSES ONLY  URINE RAPID DRUG SCREEN (HOSP PERFORMED)     Status: Abnormal   Collection Time    12/17/12 11:20 AM      Result Value Range   Opiates NONE DETECTED  NONE DETECTED   Cocaine POSITIVE (*) NONE DETECTED   Benzodiazepines NONE DETECTED  NONE DETECTED   Amphetamines NONE DETECTED  NONE DETECTED   Tetrahydrocannabinol NONE DETECTED  NONE DETECTED   Barbiturates NONE DETECTED  NONE DETECTED   Comment:            DRUG SCREEN FOR MEDICAL PURPOSES     ONLY.  IF CONFIRMATION IS NEEDED     FOR ANY PURPOSE, NOTIFY LAB     WITHIN 5 DAYS.                LOWEST DETECTABLE LIMITS     FOR URINE DRUG SCREEN     Drug Class       Cutoff (ng/mL)  Amphetamine      1000     Barbiturate      200     Benzodiazepine   200     Tricyclics       300     Opiates          300     Cocaine          300     THC              50  URINALYSIS, ROUTINE W REFLEX MICROSCOPIC     Status: Abnormal   Collection Time    12/17/12 11:20 AM      Result Value Range   Color, Urine YELLOW  YELLOW   APPearance CLOUDY (*) CLEAR   Specific Gravity, Urine 1.021  1.005 - 1.030   pH 5.0  5.0 - 8.0   Glucose, UA NEGATIVE  NEGATIVE mg/dL   Hgb urine dipstick TRACE (*) NEGATIVE   Bilirubin Urine NEGATIVE  NEGATIVE   Ketones, ur 15 (*) NEGATIVE mg/dL   Protein, ur NEGATIVE  NEGATIVE mg/dL   Urobilinogen, UA 0.2  0.0 - 1.0 mg/dL   Nitrite NEGATIVE  NEGATIVE   Leukocytes, UA NEGATIVE  NEGATIVE  URINE MICROSCOPIC-ADD ON     Status: None   Collection Time    12/17/12 11:20 AM      Result Value  Range   Urine-Other AMORPHOUS URATES/PHOSPHATES      Physical Findings: AIMS: Facial and Oral Movements Muscles of Facial Expression: None, normal Lips and Perioral Area: None, normal Jaw: None, normal Tongue: None, normal,Extremity Movements Upper (arms, wrists, hands, fingers): None, normal Lower (legs, knees, ankles, toes): None, normal, Trunk Movements Neck, shoulders, hips: None, normal, Overall Severity Severity of abnormal movements (highest score from questions above): None, normal Incapacitation due to abnormal movements: None, normal Patient's awareness of abnormal movements (rate only patient's report): No Awareness, Dental Status Current problems with teeth and/or dentures?: No Does patient usually wear dentures?: No  CIWA:  CIWA-Ar Total: 0 COWS:  COWS Total Score: 2  Psychiatric Specialty Exam: See Psychiatric Specialty Exam and Suicide Risk Assessment completed by Attending Physician prior to discharge.  Discharge destination:  Home  Is patient on multiple antipsychotic therapies at discharge:  No   Has Patient had three or more failed trials of antipsychotic monotherapy by history:  No  Recommended Plan for Multiple Antipsychotic Therapies: NA     Medication List    Notice   You have not been prescribed any medications.     Follow-up Information   Follow up with [facility]. (Walk in between 8AM-9AM Monday through Friday for hospital followup/medication management. )    Contact information:   [address] [place] [address] Phone: [phone] Fax: [phone]     Follow-up recommendations:  Activity:  As tolerated Diet: As recommended by your primary care doctor. Keep all scheduled follow-up appointments as recommended.  Comments:  Take all your medications as prescribed by your mental healthcare provider. Report any adverse effects and or reactions from your medicines to your outpatient provider promptly. Patient is instructed and cautioned to  not engage in alcohol and or illegal drug use while on prescription medicines. In the event of worsening symptoms, patient is instructed to call the crisis hotline, 911 and or go to the nearest ED for appropriate evaluation and treatment of symptoms. Follow-up with your primary care provider for your other medical issues, concerns and or health care needs.   Total Discharge  Time:  Greater than 30 minutes.  Signed: Armandina Stammer I, PMHNP-BC 12/20/2012, 9:15 AM

## 2012-12-20 NOTE — Progress Notes (Signed)
[facility] Adult Case Management Discharge Plan :  Will you be returning to the same living situation after discharge: Yes,  home to live with Girlfriend in [place].  At discharge, do you have transportation home?:Yes,  bus pass in chart. Do you have the ability to pay for your medications:Yes,  mental health  Release of information consent forms completed and in the chart;  Patient's signature needed at discharge.  Patient to Follow up at: Follow-up Information   Follow up with [facility]. (Walk in between 8AM-9AM Monday through Friday for hospital followup/medication management. )    Contact information:   [address], [place] [address] Phone: [phone] Fax: [phone]      Patient denies SI/HI:   Yes,  during group/self report.     Safety Planning and Suicide Prevention discussed:  Yes,  PT refused to consent to family contact. SPI pamphlet provided to pt and he was encouraged to ask questions, talk about concerns, and share with his support network.   Smart, Aralyn Nowak 12/20/2012, 9:32 AM

## 2012-12-24 ENCOUNTER — Emergency Department (HOSPITAL_COMMUNITY)
Admission: EM | Admit: 2012-12-24 | Discharge: 2012-12-25 | Disposition: A | Discharge status: Other Acute Inpatient Hospital | Payer: Self-pay | Attending: Emergency Medicine | Admitting: Emergency Medicine

## 2012-12-24 ENCOUNTER — Encounter (HOSPITAL_COMMUNITY): Payer: Self-pay | Admitting: Emergency Medicine

## 2012-12-24 DIAGNOSIS — F1010 Alcohol abuse, uncomplicated: Secondary | ICD-10-CM | POA: Insufficient documentation

## 2012-12-24 DIAGNOSIS — F1410 Cocaine abuse, uncomplicated: Secondary | ICD-10-CM | POA: Insufficient documentation

## 2012-12-24 DIAGNOSIS — Z8719 Personal history of other diseases of the digestive system: Secondary | ICD-10-CM | POA: Insufficient documentation

## 2012-12-24 DIAGNOSIS — R109 Unspecified abdominal pain: Secondary | ICD-10-CM | POA: Insufficient documentation

## 2012-12-24 DIAGNOSIS — R45851 Suicidal ideations: Secondary | ICD-10-CM

## 2012-12-24 DIAGNOSIS — R011 Cardiac murmur, unspecified: Secondary | ICD-10-CM | POA: Insufficient documentation

## 2012-12-24 DIAGNOSIS — F17200 Nicotine dependence, unspecified, uncomplicated: Secondary | ICD-10-CM | POA: Insufficient documentation

## 2012-12-24 DIAGNOSIS — Z8659 Personal history of other mental and behavioral disorders: Secondary | ICD-10-CM | POA: Insufficient documentation

## 2012-12-24 DIAGNOSIS — F1910 Other psychoactive substance abuse, uncomplicated: Secondary | ICD-10-CM

## 2012-12-24 DIAGNOSIS — G4710 Hypersomnia, unspecified: Secondary | ICD-10-CM | POA: Insufficient documentation

## 2012-12-24 LAB — COMPREHENSIVE METABOLIC PANEL
Albumin: 3.6 g/dL (ref 3.5–5.2)
Alkaline Phosphatase: 71 U/L (ref 39–117)
BUN: 12 mg/dL (ref 6–23)
CO2: 25 mEq/L (ref 19–32)
Calcium: 8.6 mg/dL (ref 8.4–10.5)
Chloride: 100 mEq/L (ref 96–112)
Creatinine, Ser: 0.87 mg/dL (ref 0.50–1.35)
Glucose, Bld: 95 mg/dL (ref 70–99)
Potassium: 3.9 mEq/L (ref 3.5–5.1)
Total Bilirubin: 0.8 mg/dL (ref 0.3–1.2)

## 2012-12-24 LAB — ETHANOL: Alcohol, Ethyl (B): 28 mg/dL — ABNORMAL HIGH (ref 0–11)

## 2012-12-24 LAB — COMPREHENSIVE METABOLIC PANEL WITH GFR
ALT: 37 U/L (ref 0–53)
AST: 59 U/L — ABNORMAL HIGH (ref 0–37)
GFR calc Af Amer: >90 mL/min (ref >90)
GFR calc non Af Amer: >90 mL/min (ref >90)
Sodium: 140 meq/L (ref 135–145)
Total Protein: 7.5 g/dL (ref 6.0–8.3)

## 2012-12-24 LAB — RAPID URINE DRUG SCREEN, HOSP PERFORMED
Amphetamines: NOT DETECTED
Barbiturates: NOT DETECTED
Benzodiazepines: POSITIVE — AB
Cocaine: POSITIVE — AB
Opiates: NOT DETECTED
Tetrahydrocannabinol: POSITIVE — AB

## 2012-12-24 LAB — CBC
HCT: 44.7 % (ref 39.0–52.0)
Hemoglobin: 15.9 g/dL (ref 13.0–17.0)
MCH: 30.2 pg (ref 26.0–34.0)
MCHC: 35.6 g/dL (ref 30.0–36.0)
MCV: 84.8 fL (ref 78.0–100.0)
Platelets: 311 K/uL (ref 150–400)
RBC: 5.27 MIL/uL (ref 4.22–5.81)
RDW: 14.7 % (ref 11.5–15.5)
WBC: 8.4 10*3/uL (ref 4.0–10.5)

## 2012-12-24 LAB — SALICYLATE LEVEL: Salicylate Lvl: <2 mg/dL — ABNORMAL LOW (ref 2.8–20.0)

## 2012-12-24 LAB — ACETAMINOPHEN LEVEL: Acetaminophen (Tylenol), Serum: <15 ug/mL (ref 10–30)

## 2012-12-24 MED ORDER — ALUM & MAG HYDROXIDE-SIMETH 200-200-20 MG/5ML PO SUSP: 30.0000 mL | ORAL | Status: DC | PRN

## 2012-12-24 MED ORDER — LORAZEPAM 1 MG PO TABS
0.0000 mg | ORAL_TABLET | Freq: Four times a day (QID) | ORAL | Status: DC
  Administered 2012-12-24: 1 mg via ORAL
  Filled 2012-12-24: qty 1

## 2012-12-24 MED ORDER — FOLIC ACID 1 MG PO TABS
1.0000 mg | ORAL_TABLET | Freq: Every day | ORAL | Status: DC
  Administered 2012-12-24 – 2012-12-25 (×2): 1 mg via ORAL
  Filled 2012-12-24 (×2): qty 1

## 2012-12-24 MED ORDER — THIAMINE HCL 100 MG/ML IJ SOLN: 100.0000 mg | Freq: Every day | INTRAMUSCULAR | Status: DC

## 2012-12-24 MED ORDER — LORAZEPAM 1 MG PO TABS: 0.0000 mg | ORAL_TABLET | Freq: Two times a day (BID) | ORAL | Status: DC

## 2012-12-24 MED ORDER — ADULT MULTIVITAMIN W/MINERALS CH
1.0000 | ORAL_TABLET | Freq: Every day | ORAL | Status: DC
  Administered 2012-12-24 – 2012-12-25 (×2): 1 via ORAL
  Filled 2012-12-24 (×2): qty 1

## 2012-12-24 MED ORDER — VITAMIN B-1 100 MG PO TABS
100.0000 mg | ORAL_TABLET | Freq: Every day | ORAL | Status: DC
  Administered 2012-12-24 – 2012-12-25 (×2): 100 mg via ORAL
  Filled 2012-12-24 (×2): qty 1

## 2012-12-24 MED ORDER — ONDANSETRON HCL 4 MG PO TABS: 4.0000 mg | ORAL_TABLET | Freq: Three times a day (TID) | ORAL | Status: DC | PRN

## 2012-12-24 MED ORDER — ACETAMINOPHEN 325 MG PO TABS: 650.0000 mg | ORAL_TABLET | ORAL | Status: DC | PRN

## 2012-12-24 NOTE — ED Notes (Signed)
House coverage notified need for sitter

## 2012-12-24 NOTE — BH Assessment (Signed)
Per [name], AC at [facility], adult unit is at capacity. Contacted the following facilities for placement.   [facility]: At capacity  [facility]: At capacity  [facility]: At capacity  [facility]: At capacity  [facility]: At capacity  [facility]: At capacity  [facility]: At capacity  [facility]: At capacity  [facility]: At capacity   [facility]: Beds available. Faxed clinical information.  [facility]: Beds available. Faxed clinical information.    Adam [name] Adam [name], LPC, NCC  Triage Specialist  

## 2012-12-24 NOTE — ED Provider Notes (Signed)
CSN: [id number]     Arrival date & time 12/24/12  1248 History   First MD Initiated Contact with Patient 12/24/12 1408     Chief Complaint  Patient presents with  . Suicidal   (Consider location/radiation/quality/duration/timing/severity/associated sxs/prior Treatment) HPI Comments: Pt was recently at [facility] apparently, was released and he indicates that he has to leave a little earlier his history vacilates in relation to the timing of events. He tells me his last drinking was last night with moonshine type liquor, and also did cocaine. He reports that his drinking and drug use is what led to his girlfriend having an argument with him and then breaking up with him. However he initially told me that his argumentative breaking up with his girlfriend occurred 2 nights ago. Due to the breakup, the patient reports feeling more depressed and having suicidal thoughts. Patient has been seen for alcohol abuse, depression and suicidal thoughts numerous times in the past. As mentioned above, the patient was recently in the hospital. He is been seen in the emergency department several times in the past. Patient reports he has some vague diffuse abdominal discomfort that seems to occur primarily when he's been drinking. He reports his last drink was early this morning. He denies fever cough, cold symptoms, no vomiting or diarrhea.  Patient is a 27 y.o. male presenting with mental health disorder. The history is provided by the patient and medical records.  Mental Health Problem Presenting symptoms: suicidal thoughts   Onset quality:  Gradual Duration:  2 days Timing:  Constant Context: alcohol use and drug abuse   Ineffective treatments:  Antidepressants Associated symptoms: abdominal pain, hypersomnia and poor judgment   Associated symptoms: no chest pain     Past Medical History  Diagnosis Date  . Heart murmur     Stated he had an abnormal heart reading  . GERD (gastroesophageal reflux disease)   .  Polysubstance abuse   . Depression    Past Surgical History  Procedure Laterality Date  . Hand surgery     History reviewed. No pertinent family history. History  Substance Use Topics  . Smoking status: Current Every Day Smoker -- 1.00 packs/day for 6 years    Types: Cigarettes  . Smokeless tobacco: Not on file  . Alcohol Use: Yes     Comment: 8 40's daily    Review of Systems  Unable to perform ROS: Psychiatric disorder  Cardiovascular: Negative for chest pain.  Gastrointestinal: Positive for abdominal pain.  Psychiatric/Behavioral: Positive for suicidal ideas.    Allergies  Review of patient's allergies indicates no known allergies.  Home Medications  No current outpatient prescriptions on file. BP 133/77  Pulse 99  Temp(Src) 98 F (36.7 C)  Resp 16  Ht 5\' 9"  (1.753 m)  Wt 177 lb (80.287 kg)  BMI 26.13 kg/m2  SpO2 93% Physical Exam  Nursing note and vitals reviewed. Constitutional: He is oriented to person, place, and time. He appears well-developed and well-nourished. No distress.  HENT:  Head: Normocephalic and atraumatic.  Eyes: Conjunctivae and EOM are normal. No scleral icterus.  Neck: Neck supple.  Cardiovascular: Normal rate, regular rhythm and intact distal pulses.   Pulmonary/Chest: Effort normal. No respiratory distress.  Abdominal: Soft. He exhibits no distension. There is no tenderness. There is no rebound.  Musculoskeletal: He exhibits no tenderness.  Neurological: He is alert and oriented to person, place, and time. No cranial nerve deficit. He exhibits normal muscle tone. Coordination normal.  Skin: Skin is  warm and dry.  Psychiatric: His speech is delayed and slurred. He is slowed. He expresses impulsivity and inappropriate judgment. He expresses suicidal ideation. He exhibits abnormal recent memory.    ED Course  Procedures (including critical care time) Labs Review Labs Reviewed  COMPREHENSIVE METABOLIC PANEL - Abnormal; Notable for the  following:    AST 59 (*)    All other components within normal limits  [name] - Abnormal; Notable for the following:    Alcohol, Ethyl (B) 28 (*)    All other components within normal limits  SALICYLATE LEVEL - Abnormal; Notable for the following:    Salicylate Lvl <2.0 (*)    All other components within normal limits  CBC  ACETAMINOPHEN LEVEL  URINE RAPID DRUG SCREEN (HOSP PERFORMED)   Imaging Review No results found.  EKG Interpretation   None       MDM   1. Substance abuse   2. Suicidal thoughts      Pt with recurrent symptoms of substance abuse, depression and voiced SI.  Pt has had numerous opportunities to help himself, instead chooses to do drugs, drink, seems to get depressed and cycles back to the ED.  Currently pt is drowsy, likely due to cocaine withdrawal versus alcohol intoxication, however he reports last use was last night, I would expect alcohol to be cleared.  No h/o withdrawal seizures or DT's.  Will clear with labs, then consult TTS to assess pt for voluntary admission if indicated.  Pt's abd is soft, I suspect his vague pain is due to h/o GERD and possibly due to alcohol use.      Gavin Pound. Jeffren Dombek, MD 12/24/12 1512

## 2012-12-24 NOTE — ED Notes (Signed)
Pt brought back to room from triage; pt's belongings bag was labeled with pt's stickers and placed behind nurses' station; [place], [place] made aware

## 2012-12-24 NOTE — BH Assessment (Signed)
Tele Assessment Note   Adam [name] is an 27 y.o. male. Pt comes to [facility] reporting SI.  States he was just discharged from [facility] about 4 days ago and he "left too soon."  Pt reports he has been using drugs/alcohol since discharge and his girlfriend broke up with him and kicked him out of her home.  He said last night he drank excessively and used cocaine in an attempt to kill himself by "blowing my heart out."  Pt endorses SI with plan and intent and reports he cannot contract for safety.  Pt denies HI/AV.  Pt reports that he has been drinking and using cocaine on a daily basis for the past 2 years.  PT UDS also positive for marijuana and benzos, but pt denied use of these drugs.    Axis I: Mood Disorder NOS and Substance Abuse Axis II: Deferred Axis III:  Past Medical History  Diagnosis Date  . Heart murmur     Stated he had an abnormal heart reading  . GERD (gastroesophageal reflux disease)   . Polysubstance abuse   . Depression    Axis IV: housing problems, occupational problems and problems with primary support group Axis V: 31-40 impairment in reality testing  Past Medical History:  Past Medical History  Diagnosis Date  . Heart murmur     Stated he had an abnormal heart reading  . GERD (gastroesophageal reflux disease)   . Polysubstance abuse   . Depression     Past Surgical History  Procedure Laterality Date  . Hand surgery      Family History: History reviewed. No pertinent family history.  Social History:  reports that he has been smoking Cigarettes.  He has a 6 pack-year smoking history. He does not have any smokeless tobacco history on file. He reports that he drinks alcohol. He reports that he uses illicit drugs ("Crack" cocaine and Cocaine).  Additional Social History:  Alcohol / Drug Use Pain Medications: pt denies Prescriptions: pt denies Over the Counter: pt denies History of alcohol / drug use?: Yes Negative Consequences of Use: Financial;Legal;Personal  relationships;Work / Programmer, multimedia Withdrawal Symptoms: Nausea / Vomiting Substance #1 Name of Substance 1: alcohol 1 - Age of First Use: 65yrs 1 - Amount (size/oz): at least 8 40 oz beers 1 - Frequency: daily 1 - Duration: 2 years (except during [facility] admit last week) 1 - Last Use / Amount: 12/23/12, 18 beers, 1/2 gallon moonshine Substance #2 Name of Substance 2: cocaine 2 - Age of First Use: 18 2 - Amount (size/oz): $100-200 2 - Frequency: daily 2 - Duration: 2 years 2 - Last Use / Amount: 10/11 $300 Substance #3 Name of Substance 3: pt UDS + for benzo and marijuana but pt denies use  CIWA: CIWA-Ar BP: 133/77 mmHg Pulse Rate: 99 Nausea and Vomiting: no nausea and no vomiting (pt did throw up this AM) Tactile Disturbances: none Tremor: no tremor Auditory Disturbances: not present Paroxysmal Sweats: no sweat visible Visual Disturbances: not present Anxiety: mildly anxious Headache, Fullness in Head: very mild Agitation: normal activity Orientation and Clouding of Sensorium: oriented and can do serial additions CIWA-Ar Total: 2 COWS:    Allergies: No Known Allergies  Home Medications:  (Not in a hospital admission)  OB/GYN Status:  No LMP for male patient.  General Assessment Data Location of Assessment: [facility] ED ACT Assessment: Yes Is this a Tele or Face-to-Face Assessment?: Tele Assessment Is this an Initial Assessment or a Re-assessment for this encounter?: Initial  Assessment Living Arrangements: Spouse/significant other Can pt return to current living arrangement?: No Admission Status: Voluntary Is patient capable of signing voluntary admission?: Yes Transfer from: Acute Hospital Referral Source: Self/Family/Friend     [facility] Crisis Care Plan Living Arrangements: Spouse/significant other Name of Psychiatrist: none Name of Therapist: none     Risk to self Suicidal Ideation: Yes-Currently Present Suicidal Intent: Yes-Currently Present Is patient at risk for  suicide?: Yes Suicidal Plan?: Yes-Currently Present Specify Current Suicidal Plan: walk in front of care or OD on drugs/alcohol Access to Means: Yes Specify Access to Suicidal Means: traffic/drugs What has been your use of drugs/alcohol within the last 12 months?: current heavy use Previous Attempts/Gestures: Yes How many times?: 2 Triggers for Past Attempts: Other (Comment) (job loss, "feeling bad about myself.") Intentional Self Injurious Behavior: None Family Suicide History: No Recent stressful life event(s): Other (Comment) (girlfriend broke up yesterday, unemployed, housing issues) Persecutory voices/beliefs?: No Depression: Yes Depression Symptoms: Despondent;Insomnia;Guilt;Loss of interest in usual pleasures;Feeling angry/irritable Substance abuse history and/or treatment for substance abuse?: Yes Suicide prevention information given to non-admitted patients: Not applicable  Risk to Others Homicidal Ideation: No Thoughts of Harm to Others: No Current Homicidal Intent: No Current Homicidal Plan: No Access to Homicidal Means: No History of harm to others?: No Assessment of Violence: None Noted Does patient have access to weapons?: No Criminal Charges Pending?: Yes Describe Pending Criminal Charges: shoplifting, tampering with vehicle Does patient have a court date: Yes Court Date:  (November 2014.  Unsure of exact date)  Psychosis Hallucinations: None noted Delusions: None noted  Mental Status Report Appear/Hygiene: Other (Comment) (casual) Eye Contact: Good Motor Activity: Unremarkable Speech: Logical/coherent Level of Consciousness: Alert Mood: Other (Comment) (cooperative) Affect: Appropriate to circumstance Anxiety Level: Minimal Thought Processes: Coherent;Relevant Judgement: Unimpaired Orientation: Person;Place;Time;Situation Obsessive Compulsive Thoughts/Behaviors: None  Cognitive Functioning Concentration: Normal Memory: Recent Intact;Remote  Intact IQ: Average Insight: Fair Impulse Control: Poor Appetite: Poor Weight Loss: 5 Weight Gain: 0 Sleep: Decreased Total Hours of Sleep: 3 Vegetative Symptoms: None  ADLScreening [facility] Assessment Services) Patient's cognitive ability adequate to safely complete daily activities?: Yes Patient able to express need for assistance with ADLs?: Yes Independently performs ADLs?: Yes (appropriate for developmental age)  Prior Inpatient Therapy Prior Inpatient Therapy: Yes (also [facility], (residential substance abuse)) Prior Therapy Dates: last week, 12/2012 Prior Therapy Facilty/Provider(s): [facility] Reason for Treatment: SI/SA  Prior Outpatient Therapy Prior Outpatient Therapy: No  ADL Screening (condition at time of admission) Patient's cognitive ability adequate to safely complete daily activities?: Yes Patient able to express need for assistance with ADLs?: Yes Independently performs ADLs?: Yes (appropriate for developmental age)  Home Assistive Devices/Equipment Home Assistive Devices/Equipment: None    Abuse/Neglect Assessment (Assessment to be complete while patient is alone) Physical Abuse: Denies Verbal Abuse: Denies Sexual Abuse: Denies Exploitation of patient/patient's resources: Denies Self-Neglect: Denies Values / Beliefs Cultural Requests During Hospitalization: None Spiritual Requests During Hospitalization: None   Advance Directives (For Healthcare) Advance Directive: Patient does not have advance directive;Patient would not like information    Additional Information 1:1 In Past 12 Months?: No CIRT Risk: No Elopement Risk: No Does patient have medical clearance?: Yes     Disposition: SW discussed pt with Dr Jenelle Mages of [facility], who agreed with plan to seek inpt psych treatment at this time. Disposition Initial Assessment Completed for this Encounter: Yes Disposition of Patient: Inpatient treatment program Type of inpatient treatment program:  Adult  Lorri Frederick 12/24/2012 5:22 PM

## 2012-12-24 NOTE — ED Notes (Signed)
Per pt sts he is having suicidal thoughts and drank a lot of alcohol and did cocaine last night. sts he did this to attempt to kill himself. sts having right side pain.

## 2012-12-24 NOTE — BH Assessment (Signed)
Received call from [place] at [facility] who said Pt is declined at their facilities due to his substance abuse issues.  Harlin Rain Ria Comment, [facility] Triage Specialist

## 2012-12-24 NOTE — ED Notes (Signed)
Pt wanded by security. 

## 2012-12-25 ENCOUNTER — Inpatient Hospital Stay (HOSPITAL_COMMUNITY)
Admission: AD | Admit: 2012-12-25 | Discharge: 2012-12-28 | DRG: 895 | Disposition: A | Discharge status: Home / Self Care | Payer: Federal, State, Local not specified - Other | Source: Intra-hospital | Attending: Psychiatry | Admitting: Psychiatry

## 2012-12-25 ENCOUNTER — Encounter (HOSPITAL_COMMUNITY): Payer: Self-pay

## 2012-12-25 DIAGNOSIS — F1994 Other psychoactive substance use, unspecified with psychoactive substance-induced mood disorder: Secondary | ICD-10-CM | POA: Diagnosis present

## 2012-12-25 DIAGNOSIS — F1410 Cocaine abuse, uncomplicated: Secondary | ICD-10-CM | POA: Diagnosis present

## 2012-12-25 DIAGNOSIS — K219 Gastro-esophageal reflux disease without esophagitis: Secondary | ICD-10-CM | POA: Diagnosis present

## 2012-12-25 DIAGNOSIS — F1020 Alcohol dependence, uncomplicated: Principal | ICD-10-CM | POA: Diagnosis present

## 2012-12-25 MED ORDER — VITAMIN B-1 100 MG PO TABS
100.0000 mg | ORAL_TABLET | Freq: Every day | ORAL | Status: DC
Start: 2012-12-26 — End: 2012-12-28
  Administered 2012-12-26: 100 mg via ORAL
  Filled 2012-12-25 (×4): qty 1

## 2012-12-25 MED ORDER — THIAMINE HCL 100 MG/ML IJ SOLN
100.0000 mg | Freq: Once | INTRAMUSCULAR | Status: DC
  Filled 2012-12-25: qty 2

## 2012-12-25 MED ORDER — ACETAMINOPHEN 325 MG PO TABS: 650.0000 mg | ORAL_TABLET | Freq: Four times a day (QID) | ORAL | Status: DC | PRN

## 2012-12-25 MED ORDER — ADULT MULTIVITAMIN W/MINERALS CH
1.0000 | ORAL_TABLET | Freq: Every day | ORAL | Status: DC
  Administered 2012-12-26: 1 via ORAL
  Filled 2012-12-25 (×6): qty 1

## 2012-12-25 MED ORDER — CHLORDIAZEPOXIDE HCL 25 MG PO CAPS: 25.0000 mg | ORAL_CAPSULE | Freq: Four times a day (QID) | ORAL | Status: DC | PRN

## 2012-12-25 MED ORDER — HYDROXYZINE HCL 25 MG PO TABS: 25.0000 mg | ORAL_TABLET | Freq: Four times a day (QID) | ORAL | Status: DC | PRN

## 2012-12-25 MED ORDER — CHLORDIAZEPOXIDE HCL 25 MG PO CAPS
25.0000 mg | ORAL_CAPSULE | Freq: Four times a day (QID) | ORAL | Status: AC
  Administered 2012-12-25 – 2012-12-26 (×4): 25 mg via ORAL
  Filled 2012-12-25 (×5): qty 1

## 2012-12-25 MED ORDER — MAGNESIUM HYDROXIDE 400 MG/5ML PO SUSP: 30.0000 mL | Freq: Every day | ORAL | Status: DC | PRN

## 2012-12-25 MED ORDER — LOPERAMIDE HCL 2 MG PO CAPS: 2.0000 mg | ORAL_CAPSULE | ORAL | Status: DC | PRN

## 2012-12-25 MED ORDER — CHLORDIAZEPOXIDE HCL 25 MG PO CAPS
25.0000 mg | ORAL_CAPSULE | Freq: Once | ORAL | Status: AC
  Administered 2012-12-25: 25 mg via ORAL
  Filled 2012-12-25: qty 1

## 2012-12-25 MED ORDER — INFLUENZA VAC SPLIT QUAD 0.5 ML IM SUSP
0.5000 mL | INTRAMUSCULAR | Status: AC
  Administered 2012-12-26: 0.5 mL via INTRAMUSCULAR
  Filled 2012-12-25: qty 0.5

## 2012-12-25 MED ORDER — ONDANSETRON 4 MG PO TBDP: 4.0000 mg | ORAL_TABLET | Freq: Four times a day (QID) | ORAL | Status: DC | PRN

## 2012-12-25 MED ORDER — CHLORDIAZEPOXIDE HCL 25 MG PO CAPS: 25.0000 mg | ORAL_CAPSULE | Freq: Every day | ORAL | Status: DC

## 2012-12-25 MED ORDER — CHLORDIAZEPOXIDE HCL 25 MG PO CAPS
25.0000 mg | ORAL_CAPSULE | ORAL | Status: DC
  Filled 2012-12-25: qty 1

## 2012-12-25 MED ORDER — ALUM & MAG HYDROXIDE-SIMETH 200-200-20 MG/5ML PO SUSP: 30.0000 mL | ORAL | Status: DC | PRN

## 2012-12-25 MED ORDER — CHLORDIAZEPOXIDE HCL 25 MG PO CAPS
25.0000 mg | ORAL_CAPSULE | Freq: Three times a day (TID) | ORAL | Status: AC
  Administered 2012-12-27: 25 mg via ORAL
  Filled 2012-12-25 (×3): qty 1

## 2012-12-25 NOTE — Progress Notes (Signed)
Patient Discharge Instructions:  After Visit Summary (AVS):   Faxed to:  12/25/12 Discharge Summary Note:   Faxed to:  12/25/12 Psychiatric Admission Assessment Note:   Faxed to:  12/25/12 Suicide Risk Assessment - Discharge Assessment:   Faxed to:  12/25/12 Faxed/Sent to the Next Level Care provider:  12/25/12 Faxed to [facility] @ [phone]  Jerelene Redden, 12/25/2012, 4:36 PM

## 2012-12-25 NOTE — ED Provider Notes (Signed)
27 year old white male presents emergency department with suicidal ideation. Prolonged length of stay awaiting placement. Per case management and nursing patient has a bed behavioral health hospital with accepting physician Dr. Dub Mikes. I have evaluated the patient and he is aware of the plan and without issues at this time.  Plan to t/s pt to [facility].    Darlys Gales, MD 12/25/12 [phone]

## 2012-12-25 NOTE — ED Notes (Signed)
PT transferred to [facility] via [facility]. Pt's belongings/paperwork given to [facility] services to take with patient

## 2012-12-25 NOTE — BH Assessment (Signed)
Patient accepted by Nanine Means, NP pending a 300 hall bed. No beds available at this time. Writer will seek placement elsewhere until a bed becomes available here at [facility].

## 2012-12-25 NOTE — ED Provider Notes (Signed)
Case discussed with case management. Pt is pending placement at [facility] pending bed availability.  No issues currently.  Case management and nursing are aware I am available as needed.    Darlys Gales, MD 12/25/12 1044

## 2012-12-25 NOTE — ED Notes (Signed)
[facility] contacted for patient transfer to [facility]

## 2012-12-25 NOTE — ED Notes (Signed)
Consulting civil engineer verified EMTALA paperwork

## 2012-12-25 NOTE — Progress Notes (Signed)
Attended AA group 

## 2012-12-26 DIAGNOSIS — F39 Unspecified mood [affective] disorder: Secondary | ICD-10-CM

## 2012-12-26 DIAGNOSIS — F1910 Other psychoactive substance abuse, uncomplicated: Secondary | ICD-10-CM

## 2012-12-26 NOTE — BHH Suicide Risk Assessment (Signed)
Suicide Risk Assessment  Admission Assessment     Nursing information obtained from:    Demographic factors:    Current Mental Status:    Loss Factors:    Historical Factors:    Risk Reduction Factors:     CLINICAL FACTORS:   Alcohol/Substance Abuse/Dependencies  COGNITIVE FEATURES THAT CONTRIBUTE TO RISK:  Closed-mindedness Polarized thinking Thought constriction (tunnel vision)    SUICIDE RISK:   Moderate:  Frequent suicidal ideation with limited intensity, and duration, some specificity in terms of plans, no associated intent, good self-control, limited dysphoria/symptomatology, some risk factors present, and identifiable protective factors, including available and accessible social support.  PLAN OF CARE: Supportive approach/coping skills/relapse prevention                               [facility] detox protocol  I certify that inpatient services furnished can reasonably be expected to improve the patient's condition.  Watson Robarge A 12/26/2012, 4:43 PM

## 2012-12-26 NOTE — Progress Notes (Signed)
The focus of this group is to educate the patient on the purpose and policies of crisis stabilization and provide a format to answer questions about their admission.  The group details unit policies and expectations of patients while admitted. He participated in group voicing he plan to go to long term treatment.

## 2012-12-26 NOTE — Progress Notes (Signed)
Nutrition Brief Note  Patient identified on the Malnutrition Screening Tool (MST) Report.  Wt Readings from Last 10 Encounters:  12/25/12 168 lb (76.204 kg)  12/24/12 177 lb (80.287 kg)  12/17/12 176 lb (79.833 kg)  05/31/12 169 lb (76.658 kg)  09/16/11 190 lb (86.183 kg)   Body mass index is 24.11 kg/(m^2). Patient meets criteria for normal weight based on current BMI.   Discussed intake PTA with patient and compared to intake presently.  Discussed changes in intake and encouraged adequate intake of meals and snacks. Met with pt who reports not eating anything for 2-3 days prior to admission r/t drinking and doing crack. Denies any changes in weight however weight trend shows pt's weight has varied between 168-177 pounds in the past month, 177 pounds to 168 pounds in one day, question the accuracy of this weight. Pt reports great appetite and intake since admission.   Current diet order is low sodium and pt is also offered choice of unit snacks mid-morning and mid-afternoon.  Pt is eating as desired.   Labs and medications reviewed. Pt getting daily multivitamin and thiamine. AST slightly elevated.   Nutrition Dx:  Inadequate oral intake related to drug/alcohol abuse as evidenced by pt report.   Interventions:   Discussed the importance of nutrition and encouraged intake of food and beverages.     Pt aware of importance of following healthy diet and plans to improve dietary habits after discharge.   No additional nutrition interventions warranted at this time. If nutrition issues arise, please consult RD.   Levon Hedger MS, RD, LDN [phone] Pager [phone] After Hours Pager

## 2012-12-26 NOTE — Progress Notes (Signed)
Adult Psychoeducational Group Note  Date:  12/26/2012 Time:  2:52 PM  Group Topic/Focus:  Recovery Goals:   The focus of this group is to identify appropriate goals for recovery and establish a plan to achieve them.  Participation Level:  Active  Participation Quality:  Appropriate and Sharing  Affect:  Appropriate  Cognitive:  Appropriate  Insight: Appropriate and Good  Engagement in Group:  Engaged and Supportive  Modes of Intervention:  Discussion, Education and Support  Additional Comments:  Pt stated he needed to change his behavior and have more positive initiative and positive thinking.   Caswell Corwin 12/26/2012, 2:52 PM

## 2012-12-26 NOTE — Progress Notes (Signed)
Pt attended A/A 

## 2012-12-26 NOTE — BHH Suicide Risk Assessment (Signed)
[facility] Adult Inpatient Family/Significant Other Suicide Prevention Education  Suicide Prevention Education:   Patient Refusal for Family/Significant Other Suicide Prevention Education: The patient has refused to provide written consent for family/significant other to be provided Family/Significant Other Suicide Prevention Education during admission and/or prior to discharge.  Physician notified.  CSW provided suicide prevention information with patient.    The suicide prevention education provided includes the following:  Suicide risk factors  Suicide prevention and interventions  National Suicide Hotline telephone number  [facility] assessment telephone number  [facility] Emergency Assistance 911  [facility] Rehabilitation and/or Residential Mobile Crisis Unit telephone number   Reyes Ivan, [place] 12/26/2012 1:13 PM

## 2012-12-26 NOTE — BHH Group Notes (Signed)
[facility] LCSW Group Therapy  12/26/2012 1:33 PM  Type of Therapy:  Group Therapy  Participation Level:  Did Not Attend  Smart, Malene Blaydes 12/26/2012, 1:33 PM
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
## 2012-12-26 NOTE — BHH Counselor (Signed)
Adult Psychosocial Assessment Update Interdisciplinary Team  Previous Behavior Health Hospital admissions/discharges:  Admissions Discharges  Date: 12/17/12 Date: 12/20/12  Date:05/31/12 Date: 06/02/12  Date: Date:  Date: Date:  Date: Date:   Changes since the last Psychosocial Assessment (including adherence to outpatient mental health and/or substance abuse treatment, situational issues contributing to decompensation and/or relapse). Pt states that after discharge he got bored and started drinking again.  Pt reports he's been using alcohol and cocaine.  Pt states that his girlfriend broke up with him.  Pt states that he never went to [facility] for outpatient services.  Pt states that he can't return to his girlfriend's house and has been staying at a crack house.  Pt reports stressors of family conflict, his mother being sick in the hospital and not hearing from his father.  Pt wants to relocate to [facility] and start over.               Discharge Plan 1. Will you be returning to the same living situation after discharge?   Yes: No:  X    If no, what is your plan? Pt wants to relocate to [facility] and stay at a homeless shelter there.             2. Would you like a referral for services when you are discharged? Yes:  X   If yes, for what services? Pt wants to do outpatient services and stay at the homeless shelter in [place]  No:              Summary and Recommendations (to be completed by the evaluator) Patient is a 27 year old African American Male with a diagnosis of Mood Disorder NOS and Substance Abuse.  Patient is currently homeless in [place].  Patient will benefit from crisis stabilization, medication evaluation, group therapy and psycho education in addition to case management for discharge planning.                         Signature:  Carmina Miller, 12/26/2012 8:55 AM

## 2012-12-26 NOTE — Progress Notes (Signed)
Recreation Therapy Notes  Date: 10.14.2014 Time: 3:00pm Location: [address] Day[address]  Group Topic: Communication, Team Building, Problem Solving  Goal Area(s) Addresses:  Patient will effectively work with peer towards shared goal.  Patient will identify skill used to make activity successful.  Patient will identify how skills used during activity can be used to reach post d/c goals.   Behavioral Response: Did not attend.   Bastien Strawser L Nechama Escutia, LRT/CTRS  Tiquan Bouch L 12/26/2012 4:57 PM 

## 2012-12-26 NOTE — Progress Notes (Signed)
Recreation Therapy Notes  Date: 10.14.2014 Time: 2:30pm Location: [address] Day[address]  Group Topic: Animal Assisted Activities (AAA)  Behavioral Response: Did not attend  Daison Braxton L Madeline Bebout, LRT/CTRS  Betty Brooks L 12/26/2012 4:04 PM 

## 2012-12-26 NOTE — Progress Notes (Signed)
Patient ID: Adam [name], male   DOB: 19-Oct-1985, 27 y.o.   MRN: [id number]  D: Patient with dull, flat affect, sullen at times but is interacting well with peers on unit. Pt states he no longer feels that he needs to take his Librium as ordered. A: Q 15 minute safety checks, encourage staff/peer interaction and group participation. R: Pt attending group sessions and recreational times. Pt denies SI or plans to harm himself. No acute distress noted.

## 2012-12-26 NOTE — Progress Notes (Addendum)
D:  Per pt self inventory pt reports sleeping well, appetite good, energy level high, ability to pay attention good, rates depression at a 1 out of 10 and hopelessness at a 1 out of 10, denies SI/HI/AVH, denies pain.      A:  Emotional support provided, Encouraged pt to continue with treatment plan and attend all group activities, q15 min checks maintained for safety.  R:  Pt is bright, animated and cheery mood, pleasant with staff and other pts, going to groups and cooperative with medications and treatment plan, denies any withdrawal s/s, pt states that if he and his girlfriend don't stay together then he would like to discharge to the [facility] shelter.

## 2012-12-26 NOTE — Progress Notes (Signed)
Patient ID: Adam [name], male   DOB: 10-26-1985, 27 y.o.   MRN: [id number]  Patient approaching desk and demanding to be discharged right now. RN explained to pt that a doctor would have to assess him in the am to ensure he is safe for discharge. Pt agitated, darting eyes and tapping fingers. Pt states "[name] are holding me against my will; I'm not stupid". Pt asked for a note to be placed on chart about his request. Pt able to be deescalated after much discussion with this RN.

## 2012-12-27 DIAGNOSIS — F1994 Other psychoactive substance use, unspecified with psychoactive substance-induced mood disorder: Secondary | ICD-10-CM

## 2012-12-27 NOTE — Progress Notes (Signed)
D   Pt is pleasant and cooperative   He refused his 1700 librium and said he did not need any medications and he was being discharged tomorrow   He denies withdrawal symptoms  And denies suicidal and homicidal ideation   A   Verbal support given  Educate on the symptoms of withdrawal and theraputic use of medication   Assured pt he could have medication if he changes his mind  Q 15 min checks R   Pt safe at present and verbalized understanding

## 2012-12-27 NOTE — BHH Group Notes (Addendum)
BHH LCSW Group Therapy  12/27/2012  1:15 PM   Type of Therapy:  Group Therapy  Participation Level:  Active  Participation Quality:  Appropriate and Attentive  Affect:  Appropriate and Irritable  Cognitive:  Alert and Appropriate  Insight:  Developing/Improving and Engaged  Engagement in Therapy:  Developing/Improving and Engaged  Modes of Intervention:  Clarification, Confrontation, Discussion, Education, Exploration, Limit-setting, Orientation, Problem-solving, Rapport Building, Dance movement psychotherapist, Socialization and Support  Summary of Progress/Problems: The topic for group today was emotional regulation.  This group focused on both positive and negative emotion identification and allowed group members to process ways to identify feelings, regulate negative emotions, and find healthy ways to manage internal/external emotions. Group members were asked to reflect on a time when their reaction to an emotion led to a negative outcome and explored how alternative responses using emotion regulation would have benefited them. Group members were also asked to discuss a time when emotion regulation was utilized when a negative emotion was experienced. Pt was able to process, as a group, their anger and frustration in regards to this hospitalization, still being hospitalized and feeling like their needs are not being met while here.  After pt shared this he noticeably calmed down and was able to process with the group as to why he is still here and how he can accept it and stay patient.  Pt actively participated and was engaged in group discussion.    Reyes Ivan, LCSWA 12/27/2012 2:42 PM

## 2012-12-27 NOTE — Progress Notes (Signed)
[facility] MD Progress Note  12/27/2012 3:44 PM Adam [name]  MRN:  [id number] Subjective:  Adam [name] endorses that he is ready to go. He has been restless, pacing states he is not suicidal anymore. He was challenged with the fact that he did the same thing last time and left before he seemed to have been ready He states he is going to stay with his girlfriend. He states he wants to be able to take care of court and then go to a rehab.  Diagnosis:   DSM5: Schizophrenia Disorders:   Obsessive-Compulsive Disorders:   Trauma-Stressor Disorders:   Substance/Addictive Disorders:  Alcohol Related Disorder - Severe (303.90), Cocaine related disorder Depressive Disorders:    Axis I: Substance Induced Mood Disorder  ADL's:  Intact  Sleep: Fair  Appetite:  Fair  Suicidal Ideation:  Plan:  denies Intent:  denies Means:  denies Homicidal Ideation:  Plan:  denies Intent:  denies Means:  denies AEB (as evidenced by):  Psychiatric Specialty Exam: Review of Systems  Constitutional: Negative.   HENT: Negative.   Eyes: Negative.   Respiratory: Negative.   Cardiovascular: Negative.   Gastrointestinal: Negative.   Genitourinary: Negative.   Musculoskeletal: Negative.   Skin: Negative.   Neurological: Negative.   Endo/Heme/Allergies: Negative.   Psychiatric/Behavioral: Positive for substance abuse. The patient is nervous/anxious.     Blood pressure 145/86, pulse 84, temperature 98.6 F (37 C), temperature source Oral, resp. rate 18, height 5\' 10"  (1.778 m), weight 76.204 kg (168 lb).Body mass index is 24.11 kg/(m^2).  General Appearance: Fairly Groomed  Patent attorney::  Fair  Speech:  Clear and Coherent  Volume:  fluctuates  Mood:  Anxious and Irritable  Affect:  Restricted  Thought Process:  Coherent and Goal Directed  Orientation:  Full (Time, Place, and Person)  Thought Content:  moinimizes, denies  Suicidal Thoughts:  No  Homicidal Thoughts:  No  Memory:  Immediate;   Fair Recent;    Fair Remote;   Fair  Judgement:  Fair  Insight:  Shallow  Psychomotor Activity:  Restlessness  Concentration:  Fair  Recall:  Fair  Akathisia:  No  Handed:    AIMS (if indicated):     Assets:  Desire for Improvement  Sleep:  Number of Hours: 5.5   Current Medications: Current Facility-Administered Medications  Medication Dose Route Frequency Provider Last Rate Last Dose  . acetaminophen (TYLENOL) tablet 650 mg  650 mg Oral Q6H PRN Nanine Means, NP      . alum & mag hydroxide-simeth (MAALOX/MYLANTA) 200-200-20 MG/5ML suspension 30 mL  30 mL Oral Q4H PRN Nanine Means, NP      . chlordiazePOXIDE (LIBRIUM) capsule 25 mg  25 mg Oral Q6H PRN Nanine Means, NP      . chlordiazePOXIDE (LIBRIUM) capsule 25 mg  25 mg Oral TID Nanine Means, NP   25 mg at 12/27/12 1114   Followed by  . [START ON 12/28/2012] chlordiazePOXIDE (LIBRIUM) capsule 25 mg  25 mg Oral BH-qamhs Nanine Means, NP       Followed by  . [START ON 12/29/2012] chlordiazePOXIDE (LIBRIUM) capsule 25 mg  25 mg Oral Daily Nanine Means, NP      . hydrOXYzine (ATARAX/VISTARIL) tablet 25 mg  25 mg Oral Q6H PRN Nanine Means, NP      . loperamide (IMODIUM) capsule 2-4 mg  2-4 mg Oral PRN Nanine Means, NP      . magnesium hydroxide (MILK OF MAGNESIA) suspension 30 mL  30 mL Oral Daily PRN  Nanine Means, NP      . multivitamin with minerals tablet 1 tablet  1 tablet Oral Daily Nanine Means, NP   1 tablet at 12/26/12 [phone]  . ondansetron (ZOFRAN-ODT) disintegrating tablet 4 mg  4 mg Oral Q6H PRN Nanine Means, NP      . thiamine (B-1) injection 100 mg  100 mg Intramuscular Once Nanine Means, NP      . thiamine (VITAMIN B-1) tablet 100 mg  100 mg Oral Daily Nanine Means, NP   100 mg at 12/26/12 9604    Lab Results: No results found for this or any previous visit (from the past 48 hour(s)).  Physical Findings: AIMS: Facial and Oral Movements Muscles of Facial Expression: None, normal Lips and Perioral Area: None, normal Jaw: None,  normal Tongue: None, normal,Extremity Movements Upper (arms, wrists, hands, fingers): None, normal Lower (legs, knees, ankles, toes): None, normal, Trunk Movements Neck, shoulders, hips: None, normal, Overall Severity Severity of abnormal movements (highest score from questions above): None, normal Incapacitation due to abnormal movements: None, normal Patient's awareness of abnormal movements (rate only patient's report): No Awareness, Dental Status Current problems with teeth and/or dentures?: No Does patient usually wear dentures?: No  CIWA:  CIWA-Ar Total: 1 COWS:  COWS Total Score: 0  Treatment Plan Summary: Daily contact with patient to assess and evaluate symptoms and progress in treatment Medication management  Plan: Supportive approach/coping skills/relapse prevention           Encourage to stay at least one more day  Medical Decision Making Problem Points:  Review of psycho-social stressors (1) Data Points:  Review of medication regiment & side effects (2)  I certify that inpatient services furnished can reasonably be expected to improve the patient's condition.   Macoy Rodwell A 12/27/2012, 3:44 PM

## 2012-12-27 NOTE — Progress Notes (Signed)
Attended NA meeting

## 2012-12-27 NOTE — BHH Group Notes (Signed)
[facility] LCSW Aftercare Discharge Planning Group Note   12/27/2012 9:56 AM  Participation Quality:  Minimal   Mood/Affect:  Irritable  Depression Rating:  0  Anxiety Rating:  0  Thoughts of Suicide:  No Will you contract for safety?   NA  Current AVH:  No  Plan for Discharge/Comments:  Pt reports that he is ready to d/c today. He stated that he plans to return home to live with his girlfriend and follow up at [facility] for med management and therapy services. Pt reporting no withdrawal symptoms at this time.   Transportation Means: bus   Supports: girlfriend only identified support.   Smart, [employer]

## 2012-12-27 NOTE — Tx Team (Addendum)
Interdisciplinary Treatment Plan Update (Adult)  Date: 12/27/2012  Time Reviewed:  9:45 AM  Progress in Treatment: Attending groups: Yes Participating in groups:  Yes Taking medication as prescribed:  Yes Tolerating medication:  Yes Family/Significant othe contact made: No, pt refused   Patient understands diagnosis:  Yes Discussing patient identified problems/goals with staff:  Yes Medical problems stabilized or resolved:  Yes Denies suicidal/homicidal ideation: Yes Issues/concerns per patient self-inventory:  Yes Other:  New problem(s) identified: Pt and Dr. Dub Mikes met with pt.  Discussed pt's rapid readmission and lack of insight, as he demands to leave shortly after admitting.  Pt became irritable about not being able to d/c today but accepted the news.    Discharge Plan or Barriers: Pt will follow up at [facility] for medication management and therapy.  Pt states that he and his girlfriend made up and return home now.   Reason for Continuation of Hospitalization: Anxiety Depression Detox Medication Stabilization  Comments: N/A  Estimated length of stay: 1-2 days  For review of initial/current patient goals, please see plan of care.  Attendees: Patient:     Family:     Physician:  Dr. Dub Mikes 12/27/2012 10:16 AM   Nursing:   Roswell Miners, RN 12/27/2012 10:16 AM   Clinical Social Worker:  Reyes Ivan, LCSWA 12/27/2012 10:16 AM   Other: Onnie Boer, RN case manager 12/27/2012 10:16 AM   Other:  Trula Slade, LCSWA 12/27/2012 10:16 AM   Other:  Serena Colonel, NP 12/27/2012 10:16 AM   Other:  Onnie Boer, RN case manager 12/27/2012 10:25 AM   Other: Murtis Sink, RN 12/27/2012 10:26 AM   Other:    Other:    Other:    Other:    Other:     Scribe for Treatment Team:   Carmina Miller, 12/27/2012 , 10:16 AM

## 2012-12-27 NOTE — Progress Notes (Signed)
Patient ID: Adam [name], male   DOB: Feb 14, 1986, 27 y.o.   MRN: [id number] He has been up and to groups interacting with peers and staff.Self inventory: denies depression,  hopelessness, withdrawals and SI thoughts.  Has been calm and cooperative.

## 2012-12-28 DIAGNOSIS — F1410 Cocaine abuse, uncomplicated: Secondary | ICD-10-CM

## 2012-12-28 DIAGNOSIS — F1020 Alcohol dependence, uncomplicated: Principal | ICD-10-CM

## 2012-12-28 NOTE — Progress Notes (Signed)
[facility] Adult Case Management Discharge Plan :  Will you be returning to the same living situation after discharge: Yes,  returning to girlfriend's house At discharge, do you have transportation home?:Yes,  access to transportation Do you have the ability to pay for your medications:Yes,  access to meds  Release of information consent forms completed and in the chart;  Patient's signature needed at discharge.  Patient to Follow up at: Follow-up Information   Follow up with [facility] On 12/29/2012. (Walk in on this date for hospital discharge appointment, for medication management and therapy, between 8 AM - 9 AM.  Walk in clinic is Monday through Friday 8 am - 3 pm.)    Contact information:   [address], [place] [address] Phone: [phone] Fax: [phone]      Patient denies SI/HI:   Yes,  denies SI/HI    Safety Planning and Suicide Prevention discussed:  Yes,  discussed with pt.  Refused consent to contact family/friend.  See suicide prevention education note.   Carmina Miller 12/28/2012, 10:54 AM

## 2012-12-28 NOTE — Progress Notes (Signed)
Patient ID: Adam [name], male   DOB: 1986-02-06, 27 y.o.   MRN: [id number] He has been discharged home and was given two bus passes. He voiced understanding of discharge instruction and of follow up plan. He denies thoughts to harm himself , all belongs were taken home with him.

## 2012-12-28 NOTE — Progress Notes (Signed)
Adult Psychoeducational Group Note  Date:  12/28/2012 Time:  10:14 AM  Group Topic/Focus:  Orientation:   The focus of this group is to educate the patient on the purpose and policies of crisis stabilization and provide a format to answer questions about their admission.  The group details unit policies and expectations of patients while admitted.  Participation Level:  Active  Participation Quality:  Appropriate, Sharing and Supportive  Affect:  Appropriate  Cognitive:  Appropriate  Insight: Appropriate  Engagement in Group:  Engaged and Supportive  Modes of Intervention:  Orientation  Additional Comments:  Pt participated in the morning exercises. Pt stated happiness is massive harmony. Pt stated his goal is to do positive things.   Everrett Coombe C 12/28/2012, 10:14 AM

## 2012-12-28 NOTE — Progress Notes (Signed)
Patient ID: Adam [name], male   DOB: 1985/04/09, 27 y.o.   MRN: [id number] He denies thoughts of harming self and withdrawal symptoms.  Said he plans to stay clean after discharge. He has been cooperative and calm with no c/o discomfort.

## 2012-12-28 NOTE — Progress Notes (Signed)
[facility] Group Notes:  (Nursing/MHT/Case Management/Adjunct)  Date:  12/28/2012  Time:  10:22 AM  Type of Therapy:  AA Meeting   Summary of Progress/Problems: Pt attended the AA speaker group.  Adam [name] C 12/28/2012, 10:22 AM 

## 2012-12-28 NOTE — Discharge Summary (Signed)
Physician Discharge Summary Note  Patient:  Adam [name] is an 27 y.o., male MRN:  [id number] DOB:  1986-02-11 Patient phone:  [phone] (home)  Patient address:   [address] [place] [address]   Date of Admission:  12/25/2012 Date of Discharge: 12/28/12  Discharge Diagnoses: Active Problems:   * No active hospital problems. *  Axis Diagnosis:  AXIS I: Alcohol dependence cocaine abuse, substance induced mood disorder  AXIS II: Deferred  AXIS III:  Past Medical History   Diagnosis  Date   .  Heart murmur      Stated he had an abnormal heart reading   .  GERD (gastroesophageal reflux disease)    .  Polysubstance abuse    .  Depression     AXIS IV: other psychosocial or environmental problems and problems related to legal system/crime  AXIS V: 61-70 mild symptoms  Level of Care:  OP  Hospital Course:   27 Y/O male who was just discharged from our unit. He started drinking shortly afterwards. States he was staying with his girlfriend but that she will not allow him back. He states that he know he needs rehab, but states he has a number of court cases and that he needs to be out to go to court. He did not pursue follow up once D/C. He has been drinking 6-8 40 ounces, half a gallon of moonshine, three four locos as well as using cocaine.  While a patient in this hospital, Pranav Lince was enrolled in group counseling and activities as well as received the following medication Current facility-administered medications:acetaminophen (TYLENOL) tablet 650 mg, 650 mg, Oral, Q6H PRN, Nanine Means, NP;  alum & mag hydroxide-simeth (MAALOX/MYLANTA) 200-200-20 MG/5ML suspension 30 mL, 30 mL, Oral, Q4H PRN, Nanine Means, NP;  chlordiazePOXIDE (LIBRIUM) capsule 25 mg, 25 mg, Oral, Q6H PRN, Nanine Means, NP;  chlordiazePOXIDE (LIBRIUM) capsule 25 mg, 25 mg, Oral, BH-qamhs, Nanine Means, NP [START ON 12/29/2012] chlordiazePOXIDE (LIBRIUM) capsule 25 mg, 25 mg, Oral, Daily, Nanine Means, NP;   hydrOXYzine (ATARAX/VISTARIL) tablet 25 mg, 25 mg, Oral, Q6H PRN, Nanine Means, NP;  loperamide (IMODIUM) capsule 2-4 mg, 2-4 mg, Oral, PRN, Nanine Means, NP;  magnesium hydroxide (MILK OF MAGNESIA) suspension 30 mL, 30 mL, Oral, Daily PRN, Nanine Means, NP multivitamin with minerals tablet 1 tablet, 1 tablet, Oral, Daily, Nanine Means, NP, 1 tablet at 12/26/12 2130;  ondansetron (ZOFRAN-ODT) disintegrating tablet 4 mg, 4 mg, Oral, Q6H PRN, Nanine Means, NP;  thiamine (B-1) injection 100 mg, 100 mg, Intramuscular, Once, Nanine Means, NP;  thiamine (VITAMIN B-1) tablet 100 mg, 100 mg, Oral, Daily, Nanine Means, NP, 100 mg at 12/26/12 8657 The patient was detoxed with the librium protocol. Patient was anxious to leave but was challenged with the fact that he had wanted to leave on last admission then relapsed. Patient agreed to stay another day. Patient attended treatment team meeting this am and met with treatment team members. Pt symptoms, treatment plan and response to treatment discussed. Adam [name] endorsed that their symptoms have improved. Pt also stated that they are stable for discharge.  In other to control Active Problems:   * No active hospital problems. * , they will continue psychiatric care on outpatient basis. They will follow-up at  Follow-up Information   Follow up with [facility] On 12/29/2012. (Walk in on this date for hospital discharge appointment, for medication management and therapy, between 8 AM - 9 AM.  Walk in clinic is Monday through Friday  8 am - 3 pm.)    Contact information:   [address], [place] [address] Phone: [phone] Fax: [phone]    .  In addition they were instructed to take all your medications as prescribed by your mental healthcare provider, to report any adverse effects and or reactions from your medicines to your outpatient provider promptly, patient is instructed and cautioned to not engage in alcohol and or illegal drug use while on prescription  medicines, in the event of worsening symptoms, patient is instructed to call the crisis hotline, 911 and or go to the nearest ED for appropriate evaluation and treatment of symptoms.   Upon discharge, patient adamantly denies suicidal, homicidal ideations, auditory, visual hallucinations and or delusional thinking. They left [facility] with all personal belongings in no apparent distress.  Consults:  See electronic record for details  Significant Diagnostic Studies:  See electronic record for details  Discharge Vitals:   Blood pressure 147/92, pulse 73, temperature 98.6 F (37 C), temperature source Oral, resp. rate 20, height 5\' 10"  (1.778 m), weight 76.204 kg (168 lb)..  Mental Status Exam: See Mental Status Examination and Suicide Risk Assessment completed by Attending Physician prior to discharge.  Discharge destination:  Home  Is patient on multiple antipsychotic therapies at discharge:  No  Has Patient had three or more failed trials of antipsychotic monotherapy by history: N/A Recommended Plan for Multiple Antipsychotic Therapies: N/A    Medication List    Notice   You have not been prescribed any medications.         Follow-up Information   Follow up with [facility] On 12/29/2012. (Walk in on this date for hospital discharge appointment, for medication management and therapy, between 8 AM - 9 AM.  Walk in clinic is Monday through Friday 8 am - 3 pm.)    Contact information:   [address], [place] [address] Phone: [phone] Fax: [phone]     Follow-up recommendations:   Activities: Resume typical activities Diet: Resume typical diet Tests: none Other: Follow up with outpatient provider and report any side effects to out patient prescriber.  Comments:  Take all your medications as prescribed by your mental healthcare provider. Report any adverse effects and or reactions from your medicines to your outpatient provider promptly. Patient is instructed and  cautioned to not engage in alcohol and or illegal drug use while on prescription medicines. In the event of worsening symptoms, patient is instructed to call the crisis hotline, 911 and or go to the nearest ED for appropriate evaluation and treatment of symptoms. Follow-up with your primary care provider for your other medical issues, concerns and or health care needs.  SignedFransisca Kaufmann NP-C 12/28/2012 10:37 AM

## 2012-12-28 NOTE — [facility] Suicide Risk Assessment (Signed)
Suicide Risk Assessment  Discharge Assessment     Demographic Factors:  Male  Mental Status Per Nursing Assessment::   On Admission:     Current Mental Status by Physician: In full contact with reality. There are no suicidal ideas, plans or intent.  His mood is euthymic affect is appropriate. He is willing and motivated to pursue outpatient treatment. He is worried about the court dates and wants to get them over with so he can move on and go to rehab.   Loss Factors: Legal issues  Historical Factors: NA  Risk Reduction Factors:   Sense of responsibility to family, Living with another person, especially a relative and Positive social support  Continued Clinical Symptoms: Alcohol/Drugs, substance induced mood disorder   Cognitive Features That Contribute To Risk:  Polarized thinking Thought constriction (tunnel vision)    Suicide Risk:  Minimal: No identifiable suicidal ideation.  Patients presenting with no risk factors but with morbid ruminations; may be classified as minimal risk based on the severity of the depressive symptoms  Discharge Diagnoses:   AXIS I:  Alcohol dependence cocaine abuse, substance induced mood disorder AXIS II:  Deferred AXIS III:   Past Medical History  Diagnosis Date  . Heart murmur     Stated he had an abnormal heart reading  . GERD (gastroesophageal reflux disease)   . Polysubstance abuse   . Depression    AXIS IV:  other psychosocial or environmental problems and problems related to legal system/crime AXIS V:  61-70 mild symptoms  Plan Of Care/Follow-up recommendations:  Activity:  as tolerated Diet:  regular Pursue outpatient follow up/[name]/ AA Is patient on multiple antipsychotic therapies at discharge:  No   Has Patient had three or more failed trials of antipsychotic monotherapy by history:  No  Recommended Plan for Multiple Antipsychotic Therapies: NA  Adam [name] A 12/28/2012, 11:49 AM

## 2012-12-29 NOTE — Discharge Summary (Signed)
Seen and agreed. Thomas Rhude, MD 

## 2013-01-01 NOTE — Progress Notes (Signed)
Patient Discharge Instructions:  After Visit Summary (AVS):   Faxed to:  01/01/13 Discharge Summary Note:   Faxed to:  01/01/13 Psychiatric Admission Assessment Note:   Faxed to:  01/01/13 Suicide Risk Assessment - Discharge Assessment:   Faxed to:  01/01/13 Faxed/Sent to the Next Level Care provider:  01/01/13 Faxed to [facility] @ [phone]  Jerelene Redden, 01/01/2013, 4:17 PM

## 2013-01-02 NOTE — Discharge Summary (Signed)
Reviewed

## 2013-01-06 ENCOUNTER — Inpatient Hospital Stay (HOSPITAL_COMMUNITY)
Admission: EM | Admit: 2013-01-06 | Discharge: 2013-01-10 | DRG: 897 | Disposition: A | Discharge status: Home / Self Care | Payer: Federal, State, Local not specified - Other | Source: Intra-hospital | Attending: Psychiatry | Admitting: Psychiatry

## 2013-01-06 ENCOUNTER — Encounter (HOSPITAL_COMMUNITY): Payer: Self-pay | Admitting: Emergency Medicine

## 2013-01-06 ENCOUNTER — Encounter (HOSPITAL_COMMUNITY): Payer: Self-pay | Admitting: Intensive Care

## 2013-01-06 ENCOUNTER — Emergency Department (HOSPITAL_COMMUNITY)
Admission: EM | Admit: 2013-01-06 | Discharge: 2013-01-06 | Disposition: A | Discharge status: Other Acute Inpatient Hospital | Payer: Self-pay | Attending: Emergency Medicine | Admitting: Emergency Medicine

## 2013-01-06 DIAGNOSIS — R45851 Suicidal ideations: Secondary | ICD-10-CM | POA: Insufficient documentation

## 2013-01-06 DIAGNOSIS — R000 Tachycardia, unspecified: Secondary | ICD-10-CM | POA: Insufficient documentation

## 2013-01-06 DIAGNOSIS — F329 Major depressive disorder, single episode, unspecified: Secondary | ICD-10-CM | POA: Insufficient documentation

## 2013-01-06 DIAGNOSIS — R1012 Left upper quadrant pain: Secondary | ICD-10-CM | POA: Insufficient documentation

## 2013-01-06 DIAGNOSIS — R011 Cardiac murmur, unspecified: Secondary | ICD-10-CM | POA: Insufficient documentation

## 2013-01-06 DIAGNOSIS — Z8719 Personal history of other diseases of the digestive system: Secondary | ICD-10-CM | POA: Insufficient documentation

## 2013-01-06 DIAGNOSIS — F17200 Nicotine dependence, unspecified, uncomplicated: Secondary | ICD-10-CM | POA: Insufficient documentation

## 2013-01-06 DIAGNOSIS — F1020 Alcohol dependence, uncomplicated: Principal | ICD-10-CM | POA: Diagnosis present

## 2013-01-06 DIAGNOSIS — Z87828 Personal history of other (healed) physical injury and trauma: Secondary | ICD-10-CM | POA: Insufficient documentation

## 2013-01-06 DIAGNOSIS — F1410 Cocaine abuse, uncomplicated: Secondary | ICD-10-CM | POA: Insufficient documentation

## 2013-01-06 DIAGNOSIS — F3289 Other specified depressive episodes: Secondary | ICD-10-CM | POA: Insufficient documentation

## 2013-01-06 DIAGNOSIS — F1010 Alcohol abuse, uncomplicated: Secondary | ICD-10-CM | POA: Insufficient documentation

## 2013-01-06 DIAGNOSIS — F332 Major depressive disorder, recurrent severe without psychotic features: Secondary | ICD-10-CM | POA: Diagnosis present

## 2013-01-06 DIAGNOSIS — Z79899 Other long term (current) drug therapy: Secondary | ICD-10-CM

## 2013-01-06 DIAGNOSIS — K2920 Alcoholic gastritis without bleeding: Secondary | ICD-10-CM

## 2013-01-06 DIAGNOSIS — F1994 Other psychoactive substance use, unspecified with psychoactive substance-induced mood disorder: Secondary | ICD-10-CM | POA: Diagnosis present

## 2013-01-06 DIAGNOSIS — K219 Gastro-esophageal reflux disease without esophagitis: Secondary | ICD-10-CM | POA: Diagnosis present

## 2013-01-06 HISTORY — DX: Residual foreign body in soft tissue: M79.5

## 2013-01-06 LAB — CBC
Hemoglobin: 14.9 g/dL (ref 13.0–17.0)
MCH: 31.4 pg (ref 26.0–34.0)
MCV: 88 fL (ref 78.0–100.0)
Platelets: 222 10*3/uL (ref 150–400)
RBC: 4.74 MIL/uL (ref 4.22–5.81)
WBC: 20.6 10*3/uL — ABNORMAL HIGH (ref 4.0–10.5)

## 2013-01-06 LAB — COMPREHENSIVE METABOLIC PANEL
ALT: 33 U/L (ref 0–53)
AST: 67 U/L — ABNORMAL HIGH (ref 0–37)
Albumin: 3.4 g/dL — ABNORMAL LOW (ref 3.5–5.2)
Alkaline Phosphatase: 69 U/L (ref 39–117)
CO2: 24 mEq/L (ref 19–32)
Chloride: 95 mEq/L — ABNORMAL LOW (ref 96–112)
GFR calc Af Amer: >90 mL/min (ref >90)
GFR calc non Af Amer: >90 mL/min (ref >90)
Glucose, Bld: 84 mg/dL (ref 70–99)
Potassium: 4.5 mEq/L (ref 3.5–5.1)
Sodium: 134 mEq/L — ABNORMAL LOW (ref 135–145)
Total Bilirubin: 1.1 mg/dL (ref 0.3–1.2)

## 2013-01-06 LAB — LIPASE, BLOOD: Lipase: 15 U/L (ref 11–59)

## 2013-01-06 LAB — SALICYLATE LEVEL: Salicylate Lvl: <2 mg/dL — ABNORMAL LOW (ref 2.8–20.0)

## 2013-01-06 LAB — ACETAMINOPHEN LEVEL: Acetaminophen (Tylenol), Serum: <15 ug/mL (ref 10–30)

## 2013-01-06 LAB — ETHANOL: Alcohol, Ethyl (B): 23 mg/dL — ABNORMAL HIGH (ref 0–11)

## 2013-01-06 LAB — RAPID URINE DRUG SCREEN, HOSP PERFORMED: Barbiturates: NOT DETECTED

## 2013-01-06 MED ORDER — ALUM & MAG HYDROXIDE-SIMETH 200-200-20 MG/5ML PO SUSP: 30.0000 mL | ORAL | Status: DC | PRN

## 2013-01-06 MED ORDER — MAGNESIUM HYDROXIDE 400 MG/5ML PO SUSP: 30.0000 mL | Freq: Every day | ORAL | Status: DC | PRN

## 2013-01-06 MED ORDER — THIAMINE HCL 100 MG/ML IJ SOLN: 100.0000 mg | Freq: Once | INTRAMUSCULAR | Status: DC

## 2013-01-06 MED ORDER — CHLORDIAZEPOXIDE HCL 25 MG PO CAPS: 25.0000 mg | ORAL_CAPSULE | Freq: Once | ORAL | Status: DC

## 2013-01-06 MED ORDER — CHLORDIAZEPOXIDE HCL 25 MG PO CAPS
25.0000 mg | ORAL_CAPSULE | Freq: Every day | ORAL | Status: AC
  Administered 2013-01-10: 25 mg via ORAL
  Filled 2013-01-06: qty 1

## 2013-01-06 MED ORDER — IBUPROFEN 200 MG PO TABS
600.0000 mg | ORAL_TABLET | Freq: Three times a day (TID) | ORAL | Status: DC | PRN
  Administered 2013-01-06: 600 mg via ORAL
  Filled 2013-01-06: qty 3

## 2013-01-06 MED ORDER — ADULT MULTIVITAMIN W/MINERALS CH
1.0000 | ORAL_TABLET | Freq: Every day | ORAL | Status: DC
  Administered 2013-01-06 – 2013-01-10 (×5): 1 via ORAL
  Filled 2013-01-06 (×8): qty 1

## 2013-01-06 MED ORDER — CHLORDIAZEPOXIDE HCL 25 MG PO CAPS: 25.0000 mg | ORAL_CAPSULE | Freq: Four times a day (QID) | ORAL | Status: AC | PRN

## 2013-01-06 MED ORDER — CHLORDIAZEPOXIDE HCL 25 MG PO CAPS
25.0000 mg | ORAL_CAPSULE | ORAL | Status: AC
  Administered 2013-01-09 (×2): 25 mg via ORAL
  Filled 2013-01-06 (×2): qty 1

## 2013-01-06 MED ORDER — ONDANSETRON HCL 4 MG PO TABS: 4.0000 mg | ORAL_TABLET | Freq: Three times a day (TID) | ORAL | Status: DC | PRN

## 2013-01-06 MED ORDER — NICOTINE 21 MG/24HR TD PT24: 21.0000 mg | MEDICATED_PATCH | Freq: Every day | TRANSDERMAL | Status: DC

## 2013-01-06 MED ORDER — ACETAMINOPHEN 325 MG PO TABS
650.0000 mg | ORAL_TABLET | Freq: Four times a day (QID) | ORAL | Status: DC | PRN
  Administered 2013-01-10: 650 mg via ORAL
  Filled 2013-01-06: qty 2

## 2013-01-06 MED ORDER — VITAMIN B-1 100 MG PO TABS
100.0000 mg | ORAL_TABLET | Freq: Every day | ORAL | Status: DC
  Administered 2013-01-06 – 2013-01-10 (×5): 100 mg via ORAL
  Filled 2013-01-06 (×7): qty 1

## 2013-01-06 MED ORDER — ZOLPIDEM TARTRATE 5 MG PO TABS: 5.0000 mg | ORAL_TABLET | Freq: Every evening | ORAL | Status: DC | PRN

## 2013-01-06 MED ORDER — ONDANSETRON 4 MG PO TBDP: 4.0000 mg | ORAL_TABLET | Freq: Four times a day (QID) | ORAL | Status: AC | PRN

## 2013-01-06 MED ORDER — LORAZEPAM 1 MG PO TABS: 1.0000 mg | ORAL_TABLET | Freq: Three times a day (TID) | ORAL | Status: DC | PRN

## 2013-01-06 MED ORDER — CHLORDIAZEPOXIDE HCL 25 MG PO CAPS
25.0000 mg | ORAL_CAPSULE | Freq: Three times a day (TID) | ORAL | Status: AC
  Administered 2013-01-08 (×3): 25 mg via ORAL
  Filled 2013-01-06 (×3): qty 1

## 2013-01-06 MED ORDER — LOPERAMIDE HCL 2 MG PO CAPS: 2.0000 mg | ORAL_CAPSULE | ORAL | Status: AC | PRN

## 2013-01-06 MED ORDER — HYDROXYZINE HCL 25 MG PO TABS
25.0000 mg | ORAL_TABLET | Freq: Four times a day (QID) | ORAL | Status: AC | PRN
  Administered 2013-01-06 – 2013-01-08 (×2): 25 mg via ORAL
  Filled 2013-01-06 (×2): qty 1

## 2013-01-06 MED ORDER — CHLORDIAZEPOXIDE HCL 25 MG PO CAPS
25.0000 mg | ORAL_CAPSULE | Freq: Four times a day (QID) | ORAL | Status: AC
  Administered 2013-01-06 – 2013-01-07 (×6): 25 mg via ORAL
  Filled 2013-01-06 (×6): qty 1
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
## 2013-01-06 NOTE — Progress Notes (Signed)
Patient admitted to [facility]. Patient states he was here before a "few weeks ago" and that he is "back to get detoxed completely" from alcohol and cocaine. The patient states he was drinking over eight 40oz per day. The patient states that he was using cocaine daily as well. Last use of both is today, the 25th. The patient reports minimal detox symptoms (headache and slightly anxiety present). Patient currently denies any SI/HI and auditory and visual hallucinations. Patient was given education regarding unit rules and regulations. Patient was given education regarding fall risk status (low). Patient was escorted to unit by MHT and RN. Will continue to monitor patient for safety.

## 2013-01-06 NOTE — ED Notes (Signed)
Lunch tray ordered.  Regular diet, no sharps.

## 2013-01-06 NOTE — Progress Notes (Signed)
Patient did attend the evening speaker AA meeting.  
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
## 2013-01-06 NOTE — Progress Notes (Signed)
Received phone call from TTS regarding pt's acceptance to [facility] hall.  Pt has signed both the consent to release form and voluntary consent for tx form, pt's RN notified of transfer and stated she would call [facility] for transportation.  Beverely Pace, MHT

## 2013-01-06 NOTE — ED Notes (Signed)
Pt knows that urine is needed 

## 2013-01-06 NOTE — ED Notes (Signed)
Pt states he has chronic pain in his sides from alcohol abuse and when he drinks more, the pain in his sides gets worse.

## 2013-01-06 NOTE — ED Notes (Signed)
Pt placed in paper scrubs and wanded by security  

## 2013-01-06 NOTE — ED Notes (Signed)
Telepsych called, will be ready at 10:45

## 2013-01-06 NOTE — Tx Team (Signed)
Initial Interdisciplinary Treatment Plan  PATIENT STRENGTHS: (choose at least two) Ability for insight Active sense of humor Average or above average intelligence Capable of independent living Communication skills  PATIENT STRESSORS: Financial difficulties Medication change or noncompliance Substance abuse   PROBLEM LIST: Problem List/Patient Goals Date to be addressed Date deferred Reason deferred Estimated date of resolution  ETOH Abuse 01/06/13     Crack/Cocaine Use 01/06/13                                                DISCHARGE CRITERIA:  Ability to meet basic life and health needs Adequate post-discharge living arrangements Improved stabilization in mood, thinking, and/or behavior Medical problems require only outpatient monitoring  PRELIMINARY DISCHARGE PLAN: Attend aftercare/continuing care group Outpatient therapy  PATIENT/FAMIILY INVOLVEMENT: This treatment plan has been presented to and reviewed with the patient, Adam [name].  The patient has been given the opportunity to ask questions and make suggestions.  Nestor Ramp [facility] 01/06/2013, 3:14 PM

## 2013-01-06 NOTE — BH Assessment (Signed)
[facility] Assessment Progress Note Called and spoke with pt's nurse, Trula Ore, and [place], [place], regarding pt referral for tele assessment @ 1027.  Tele assessment appt scheduled for 1045.  ED and TTS staff updated.

## 2013-01-06 NOTE — ED Notes (Signed)
Per pt, took "a lot of alcohol and a lot of crack, the past three days and this morning". "I feel like crap". Pt c/o sweating, side hurting "from drinking too much". "My head hurts". Pain located in mid forehead.

## 2013-01-07 DIAGNOSIS — F1994 Other psychoactive substance use, unspecified with psychoactive substance-induced mood disorder: Secondary | ICD-10-CM

## 2013-01-07 NOTE — Progress Notes (Signed)
  D Pt. Was in the milieu interacting with peers and staff, pt. Denies SI and HI. Continues to experience some depression and withdrawal symptoms  A Writer offered support and encouragement.  Give medications for withdrawals  R  Pt. Reports relief with the librium.  Pt. Has no complaints of pain or discomfort and remains safe on the unit.

## 2013-01-07 NOTE — [facility] Group Notes (Signed)
[facility] Group Notes:  (Clinical Social Work)  01/07/2013  10:00-11:00AM  Summary of Progress/Problems:   The main focus of today's process group was to   identify the patient's current support system and decide on other supports that can be put in place.  The picture on workbook was used to discuss why additional supports are needed, and a hand-out was distributed with four definitions/levels of support, then used to talk about how patients have given and received all different kinds of support.  An emphasis was placed on using counselor, doctor, therapy groups, 12-step groups, and problem-specific support groups to expand supports.  The patient identified medication as one additional support.  He requested his public defender's telephone number, which CSW supplied.  Type of Therapy:  Process Group with Motivational Interviewing  Participation Level:  Minimal  Participation Quality:  Attentive  Affect:  Appropriate  Cognitive:  Oriented  Insight:  Limited  Engagement in Therapy:  Limited  Modes of Intervention:   Education, Support and Processing, Activity  [employer], LCSW 01/07/2013, 1:02 PM
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
## 2013-01-07 NOTE — ED Provider Notes (Signed)
  Medical screening examination/treatment/procedure(s) were performed by non-physician practitioner and as supervising physician I was immediately available for consultation/collaboration.     Breylen Agyeman, MD 01/07/13 0746 

## 2013-01-07 NOTE — Progress Notes (Signed)
Patient ID: Adam [name], male   DOB: 1985-11-19, 27 y.o.   MRN: [id number] He has been up and to groups interacting with peers and staff. Says that he has slight withdrawal symptoms but has not requested any prn medications. Says that he is feeling better.

## 2013-01-07 NOTE — BHH Suicide Risk Assessment (Signed)
Suicide Risk Assessment  Admission Assessment     Nursing information obtained from:    Demographic factors:    Current Mental Status:    Loss Factors:    Historical Factors:    Risk Reduction Factors:     CLINICAL FACTORS:   Depression:   Comorbid alcohol abuse/dependence Alcohol/Substance Abuse/Dependencies  COGNITIVE FEATURES THAT CONTRIBUTE TO RISK:  Closed-mindedness Polarized thinking Thought constriction (tunnel vision)    SUICIDE RISK:   Moderate:  Frequent suicidal ideation with limited intensity, and duration, some specificity in terms of plans, no associated intent, good self-control, limited dysphoria/symptomatology, some risk factors present, and identifiable protective factors, including available and accessible social support.  PLAN OF CARE: Supportive approach/coping skills/relapse prevention                               Reassess and address the comorbidities                                Encourage to pursue rehab  I certify that inpatient services furnished can reasonably be expected to improve the patient's condition.  Ayliana Casciano A 01/07/2013, 3:47 PM

## 2013-01-07 NOTE — BHH Counselor (Signed)
Adult Psychosocial Assessment Update Interdisciplinary Team  [facility] admissions[facility]discharges:  Admissions Discharges  Date:  12/25/12 Date:  Date:  12/17/12 Date:  Date:  05/31/12 Date:  Date: Date:  Date: Date:   Changes since the last Psychosocial Assessment (including adherence to outpatient mental health and/or substance abuse treatment, situational issues contributing to decompensation and/or relapse). Has court cases coming up in the next 2 months, shoplifting, tampering with vehicle, attempted trafficking of roxycontin/synthetic heroin.  On the same day he got these charges, he got the same charges in [place] and has a lawyer there also.  This all really worries him, and he has a hard time getting to the court dates due to transportation.  He also has called the lawyer in [facility] and not attended his last court date.  Is out on a secured bond right now.  His girlfriend and he are having problems, and he cannot reach her by phone right now.  She wants him to stop drinking and doing drugs, but he does feel she is supportive.  He was supposed to go see her several days ago, but did not go because he drank instead.  Now he does not know if they are "still together."  States he wants to get out of this facility and go to [facility], as long as he would not miss court.  Has not done his follow-up at [facility] as scheduled.     Discharge Plan 1. Will you be returning to the same living situation after discharge?   Yes:  XX No:      If no, what is your plan?    Hopes he will be allowed to come back and live with girlfriend.       2. Would you like a referral for services when you are discharged? Yes: XX    If yes, for what services?  No:       Hopes to go to [facility] or [facility] if the dates do not interfere with his court dates.  Otherwise, needs to be set up again with [facility].  He did not follow through last time.       Summary and Recommendations (to be  completed by the evaluator) This is a 27yo [place] American male hospitalized for detox and depression, for the fourth time in 2014.  He states nothing has changed in his life.  He still has court cases and is anxious about them.  He has missed court dates.  He lives with his girlfriend, but does not know if they are broken up because he has not been in touch with her.  He would like to go to rehab, but it depends on if that would interfere with his court dates.  He did not follow up at [facility] as requested at last discharge.  He would benefit from safety monitoring, medication evaluation, psychoeducation, group therapy, and discharge planning to link with ongoing resources.                        Signature:  Sarina Ser, 01/07/2013 9:15 AM

## 2013-01-07 NOTE — Progress Notes (Signed)
Patient did attend the evening speaker AA meeting.  

## 2013-01-07 NOTE — Progress Notes (Signed)
Patient ID: Adam [name], male   DOB: [birth year], 27 y.o.   MRN: [id number]  D: Patient pleasant and cooperative with staff but with c/o anxiety at times. A: Monitor Q 15 minutes for safety, encourage staff/peer interaction and group participation. Administer medications as ordered by MD. R: Pt denies SI. Pt attended group session.

## 2013-01-08 NOTE — Progress Notes (Signed)
Pt has been sitting in the dayroom all evening in the same spot talking with a male peer.  Pt reports he feels fine and is having minimal withdrawal symptoms.  Pt denies SI/HI/AV.  Pt says he is interested in long term treatment after detox.  He has multiple court cases that are pending related to his drug use and wants help with his addictions.  Pt says he has been going to groups.  Support and encouragement offered.  Pt makes his needs known to staff.  Safety maintained with q15 minute checks.

## 2013-01-08 NOTE — Progress Notes (Signed)
Adult Psychoeducational Group Note  Date:  01/08/2013 Time:  6:23 PM  Group Topic/Focus:  Self Care:   The focus of this group is to help patients understand the importance of self-care in order to improve or restore emotional, physical, spiritual, interpersonal, and financial health.  Participation Level:  Minimal  Participation Quality:  Attentive  Affect:  Flat  Cognitive:  Appropriate  Insight: Lacking  Engagement in Group:  Limited  Modes of Intervention:  Discussion, Education and Exploration  Additional Comments:  Zaxton attended group, and engagement in group was poor. Patient was asked to share the definition of self care. Patient was asked to complete self-care assessment in the workbook. Patient was asked to share his weaknesses and strengths in the areas of physical, psychological, spiritual, relationship, emotional.   [name] 01/08/2013, 6:23 PM

## 2013-01-08 NOTE — Tx Team (Signed)
Interdisciplinary Treatment Plan Update (Adult)  Date: 01/08/2013   Time Reviewed: 11:24 AM  Progress in Treatment:  Attending groups: Yes  Participating in groups:  Yes  Taking medication as prescribed: Yes  Tolerating medication: Yes  Family/Significant othe contact made: Not yet. SPE required for this pt.   Patient understands diagnosis: Yes, AEB seeking treatment for ETOH detox, substance abuse (crack cocaine), SI/passive, and mood stabilization.  Discussing patient identified problems/goals with staff: Yes  Medical problems stabilized or resolved: Yes  Denies suicidal/homicidal ideation: Yes during group/self report.  Patient has not harmed self or Others: Yes  New problem(s) identified: n/a  Discharge Plan or Barriers: Pt reported that he is interested in i/p treatment but is concerned about several pending court dates. Pt asked that CSW fax letters to his lawyers articulating reason for admission and plan for i/p treatment. Pt asking for [facility] and [name] referral. Pt to follow up at [facility] for med management.  Additional comments: 27 Y/O male who states when he left last time he did not do any of what he said he was going to do. States that he went with his girlfriend. He started drinking the same day. Stats he thought he could drink the one beer but then drank some more then got crack. Has been drinking and using crack. The day of the admission he was thinking about killing himself by blowing his heart with crack. Still has the court cases pending. Worried. Mother is sick she was given crack had a stroke several years ago and she is not getting any better. Reason for Continuation of Hospitalization: Librium taper-withdrawals Mood stabilization Estimated length of stay: 2-3 days  For review of initial/current patient goals, please see plan of care.  Attendees:  Patient:    Family:    Physician: Geoffery Lyons MD 01/08/2013 11:23 AM   Nursing: Alease Frame RN  01/08/2013  11:23 AM   Clinical Social Worker Evin Chirco Smart, LCSWA  01/08/2013 11:23 AM   Other: Quintella Reichert, RN  01/08/2013 11:23 AM   Other:  01/08/2013 11:23 AM   Other: Massie Kluver, Community Care Coordinator  01/08/2013 11:23 AM   Other:  01/08/2013 11:23 AM   Scribe for Treatment Team:  Herbert Seta Smart LCSWA 01/08/2013 11:23 AM

## 2013-01-08 NOTE — Progress Notes (Signed)
Patient ID: Adam [name], male   DOB: 1985-11-28, 27 y.o.   MRN: [id number] He has been up and to groups interacting with peers and staff. Self inventory: depression 7 down from 9 yesterday, hopelessness 1 down from 4 . Indicated withdrawal symptoms on this form but when asked he denies having any discomfort.

## 2013-01-08 NOTE — BHH Group Notes (Signed)
BHH LCSW Group Therapy  01/08/2013 3:00 PM  Type of Therapy:  Group Therapy  Participation Level:  Active  Participation Quality:  Appropriate  Affect:  Blunted  Cognitive:  Alert  Insight:  Limited  Engagement in Therapy:  Improving  Modes of Intervention:  Discussion, Education, Exploration, Socialization and Support  Summary of Progress/Problems: Today's Topic: Overcoming Obstacles. Pt identified obstacles faced currently and processed barriers involved in overcoming these obstacles. Pt identified steps necessary for overcoming these obstacles and explored motivation (internal and external) for facing these difficulties head on. Adam [name] was engaged and attentive throughout today's group. He was called out for about 20 minutes to meet with psychiatrist. Adam [name] stated that his biggest obstacle involves several court cases that are pending. "They interfere with my ability to go into treatment and also just make me depressed because I feel like I'll never get all my stuff situated legally." [name] acknowledged that he often turns to drugs/alcohol in order to "get away from my problems for awhile" but stated that this usually leads to even more problems/charges/legal issues. Adam [name] explored his options for aftercare in an effort to be proactive with addressing these obstacles. Adam [name] identified ways to overcome these obstacles while receiving SA i/p treatment. "Can you (CSW) fax my lawyers stating that I'm in detox at the hospital and my plan to go to [facility] for 30 days. This would help with getting court cases continued and it may make them see that I'm trying to get help."    Smart, [employer]a [employer] 01/08/2013, 3:00 PM

## 2013-01-08 NOTE — Progress Notes (Signed)
Pt resting in bed with eyes closed.  No distress observed.  Respirations even/unlabored.  Safety maintained with q15 minute checks. 

## 2013-01-08 NOTE — Progress Notes (Signed)
[facility] MD Progress Note  01/08/2013 3:44 PM Ralphael Southgate  MRN:  [id number] Subjective:  Matias endorses that he is tired of relapsing. States he cant deal with the uncertainty of what is going to happen in court with his pending cases .  He is aware now that his liver enzyme is startng to go up and that his liver is starting to get affected. He states tat he wants to do the right thing. 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   HENT: Negative.   Eyes: Negative.   Respiratory: Negative.   Cardiovascular: Negative.   Gastrointestinal: Negative.   Genitourinary: Negative.   Musculoskeletal: Negative.   Skin: Negative.   Neurological: Negative.   Endo/Heme/Allergies: Negative.   Psychiatric/Behavioral: Positive for depression and substance abuse. The patient is nervous/anxious.     Blood pressure 138/79, pulse 73, temperature 98 F (36.7 C), temperature source Oral, resp. rate 18, height 5\' 10"  (1.778 m), weight 81.647 kg (180 lb), SpO2 98.00%.Body mass index is 25.83 kg/(m^2).  General Appearance: Fairly Groomed  Patent attorney::  Fair  Speech:  Clear and Coherent  Volume:  fluctuates  Mood:  Anxious and Depressed  Affect:  anxious, restless, worried  Thought Process:  Coherent and Goal Directed  Orientation:  Full (Time, Place, and Person)  Thought Content:   worries, concerns, symptoms  Suicidal Thoughts:  No  Homicidal Thoughts:  No  Memory:  Immediate;   Fair Recent;   Fair Remote;   Fair  Judgement:  Fair  Insight:  Shallow  Psychomotor Activity:  Restlessness  Concentration:  Fair  Recall:  Fair  Akathisia:  No  Handed:    AIMS (if indicated):     Assets:  Desire for Improvement  Sleep:  Number of Hours: 5   Current Medications: Current Facility-Administered Medications  Medication Dose Route Frequency Provider Last Rate Last Dose  . acetaminophen (TYLENOL) tablet 650 mg  650 mg Oral Q6H PRN Rachael Fee, MD      . alum & mag hydroxide-simeth (MAALOX/MYLANTA) 200-200-20 MG/5ML suspension 30 mL  30 mL Oral Q4H PRN Rachael Fee, MD      . chlordiazePOXIDE (LIBRIUM) capsule 25 mg  25 mg Oral Q6H PRN Rachael Fee, MD      . chlordiazePOXIDE (LIBRIUM) capsule 25 mg  25 mg Oral TID Rachael Fee, MD   25 mg at 01/08/13 1150   Followed by  . [START ON 01/09/2013] chlordiazePOXIDE (LIBRIUM) capsule 25 mg  25 mg Oral BH-qamhs Rachael Fee, MD       Followed by  . [START ON 01/10/2013] chlordiazePOXIDE (LIBRIUM) capsule 25 mg  25 mg Oral Daily Rachael Fee, MD      . chlordiazePOXIDE (LIBRIUM) capsule 25 mg  25 mg Oral Once Rachael Fee, MD      . hydrOXYzine (ATARAX/VISTARIL) tablet 25 mg  25 mg Oral  Q6H PRN Rachael Fee, MD   25 mg at 01/06/13 2202  . loperamide (IMODIUM) capsule 2-4 mg  2-4 mg Oral PRN Rachael Fee, MD      . magnesium hydroxide (MILK OF MAGNESIA) suspension 30 mL  30 mL Oral Daily PRN Rachael Fee, MD      . multivitamin with minerals tablet 1 tablet  1 tablet Oral Daily Rachael Fee, MD   1 tablet at 01/08/13 0756  . ondansetron (ZOFRAN-ODT) disintegrating tablet 4 mg  4 mg Oral Q6H PRN Rachael Fee, MD      . thiamine (B-1) injection 100 mg  100 mg Intramuscular Once Rachael Fee, MD      . thiamine (VITAMIN B-1) tablet 100 mg  100 mg Oral Daily Rachael Fee, MD   100 mg at 01/08/13 0756    Lab Results:  No results found for this or any previous visit (from the past 48 hour(s)).  Physical Findings: AIMS: Facial and Oral Movements Muscles of Facial Expression: None, normal Lips and Perioral Area: None, normal Jaw: None, normal Tongue: None, normal,Extremity Movements Upper (arms, wrists, hands, fingers): None, normal Lower (legs, knees, ankles, toes): None, normal, Trunk Movements Neck, shoulders, hips: None, normal, Overall Severity Severity of abnormal movements (highest score from questions above): None, normal Incapacitation due to abnormal movements: None, normal Patient's awareness of abnormal movements (rate only patient's report): No Awareness, Dental Status Current problems with teeth and/or dentures?: No Does patient usually wear dentures?: No  CIWA:  CIWA-Ar Total: 2 COWS:     Treatment Plan Summary: Daily contact with patient to assess and evaluate symptoms and progress in treatment Medication management  Plan: Supportive approach/coping skills/relapse prevention           Continue Librium detox protocol           Explore rehab options Medical Decision Making Problem Points:  Review of psycho-social stressors (1) Data Points:  Review of medication regiment & side effects (2)  I certify that inpatient services furnished can reasonably be expected to improve the patient's condition.   Susann Lawhorne A 01/08/2013, 3:44 PM

## 2013-01-08 NOTE — BHH Group Notes (Signed)
[facility] LCSW Aftercare Discharge Planning Group Note   01/08/2013 10:18 AM  Participation Quality:  Appropriate   Mood/Affect:  Appropriate  Depression Rating:  0  Anxiety Rating:  5  Thoughts of Suicide:  No Will you contract for safety?   NA  Current AVH:  No  Plan for Discharge/Comments:  Pt stated that he relapsed immediately after d/c and is now open to i/p treatment if approval can be given by courts. Pt has at least 4 pending court cases. CSW obtained consent to fax information about pt d/c plan/i/p treatment referrals to pt's two lawyers that are working on these cases. Pt would like referral to both [name] and [facility]. Pt to follow up at [facility] for med management.   Transportation Means: unknown at this time.   Supports: girlfriend/some family supports.   Smart, [employer]

## 2013-01-08 NOTE — Progress Notes (Signed)
Pt attended AA group 

## 2013-01-09 DIAGNOSIS — F411 Generalized anxiety disorder: Secondary | ICD-10-CM

## 2013-01-09 DIAGNOSIS — F39 Unspecified mood [affective] disorder: Secondary | ICD-10-CM

## 2013-01-09 MED ORDER — TRAZODONE HCL 50 MG PO TABS
50.0000 mg | ORAL_TABLET | Freq: Every evening | ORAL | Status: DC | PRN
  Administered 2013-01-09: 50 mg via ORAL
  Filled 2013-01-09: qty 1
  Filled 2013-01-09: qty 28

## 2013-01-09 MED ORDER — HYDROXYZINE HCL 25 MG PO TABS
25.0000 mg | ORAL_TABLET | Freq: Four times a day (QID) | ORAL | Status: DC | PRN
  Filled 2013-01-09: qty 20

## 2013-01-09 NOTE — Progress Notes (Signed)
Adult Psychoeducational Group Note  Date:  01/09/2013 Time:  6:46 PM  Group Topic/Focus:  Recovery Goals:   The focus of this group is to identify appropriate goals for recovery and establish a plan to achieve them.  Participation Level:  Active  Participation Quality:  Inattentive and Supportive  Affect:  Appropriate  Cognitive:  Appropriate  Insight: Good  Engagement in Group:  Developing/Improving  Modes of Intervention:  Discussion, Education, Exploration, Limit-setting, Socialization and Support  Additional Comments:  Jonahtan attended group and shared during group. Patient explained what recovery means to patient. Patient was asked to complete workbook on My personal recovery goals, and wrote down two things that are standing between recovery and patient. Patient stated one goal to Clinical research associate as Clinical research associate wrote it down the white board. Patient also, wrote down and stated a goal in how to stay in a stage of recovery. Patient became inattentive at times and would talk to peers in group while others were sharing, not related to subject.  [name] 01/09/2013, 6:46 PM

## 2013-01-09 NOTE — Clinical Social Work Note (Signed)
Per pt request. CSW faxed letter (delineating date of admission and discharge plan) to [place] [employer] and Almira Coaster (lawyer). Pt signed ROI and asked for a second letter to be sent to [place] stating that he was in the hospital on 10/27 (his court date). Pt requested that the letters state that he is at [facility]. Faxes sent/copies and fax confirmations given to pt.

## 2013-01-09 NOTE — BHH Group Notes (Signed)
B[facility] LCSW Group Therapy  01/09/2013 3:07 PM  Type of Therapy:  Group Therapy  Participation Level:  Minimal  Participation Quality:  Drowsy  Affect:  Lethargic  Cognitive:  Lacking  Insight:  Limited  Engagement in Therapy:  Limited  Modes of Intervention:  Education, Exploration and Support  Summary of Progress/Problems: [name] came to talk about his personal journey with substance abuse and addiction. The pt processed ways by which to relate to the speaker. [name] provided handouts and educational information pertaining to groups and services offered by the [facility]. Adam [name] was engaged and attentive throughout today's therapy group, although he presented with depressed mood and lethargic affect. Pt listened as speaker shared his personal experiences with MI and SA. Adam [name] struggled to stay away but remained engaged throughout group, showing progress in the group setting.    Smart, Angala Hilgers 01/09/2013, 3:07 PM

## 2013-01-09 NOTE — Progress Notes (Signed)
[facility] MD Progress Note  01/09/2013 4:39 PM Adam [name]  MRN:  [id number] Subjective:  Adam [name] continues to detox. He is trying to figure out where to go form here. He is trying to get the legal issues taking care of so he can go to rehab. Has had no response from his girlfriend so he is not sure where he stands in the relationship. The uncertainty makes him feel more anxious, worried. Admits to cravings. Tries to stay busy, involved in groups and with peers in order not to think about it.  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   HENT: Negative.   Eyes: Negative.   Respiratory: Negative.   Cardiovascular: Negative.   Gastrointestinal: Negative.   Genitourinary: Negative.   Musculoskeletal: Negative.   Skin: Negative.   Neurological: Negative.   Endo/Heme/Allergies: Negative.   Psychiatric/Behavioral: Positive for substance abuse. The patient is nervous/anxious.     Blood pressure 160/100, pulse 80, temperature 98.2 F (36.8 C), temperature source Oral, resp. rate 20, height 5\' 10"  (1.778 m), weight 81.647 kg (180 lb), SpO2 98.00%.Body mass index is 25.83 kg/(m^2).  General Appearance: Fairly Groomed  Patent attorney::  Fair  Speech:  Clear and Coherent  Volume:  Decreased  Mood:  Anxious and worried  Affect:  Restricted  Thought Process:  Coherent and Goal Directed  Orientation:  Full (Time, Place, and Person)  Thought Content:   worries, concerns  Suicidal Thoughts:  No  Homicidal Thoughts:  No  Memory:  Immediate;   Fair Recent;   Fair Remote;   Fair  Judgement:  Fair  Insight:  Shallow  Psychomotor Activity:  Restlessness  Concentration:  Fair  Recall:  Fair  Akathisia:  No  Handed:    AIMS (if indicated):     Assets:  Desire for Improvement  Sleep:  Number of Hours: 5.75   Current Medications: Current Facility-Administered Medications  Medication Dose Route Frequency Provider Last Rate Last Dose  . acetaminophen (TYLENOL) tablet 650 mg  650 mg Oral Q6H PRN Rachael Fee, MD      . alum & mag hydroxide-simeth (MAALOX/MYLANTA) 200-200-20 MG/5ML suspension 30 mL  30 mL Oral Q4H PRN Rachael Fee, MD      . chlordiazePOXIDE (LIBRIUM) capsule 25 mg  25 mg Oral BH-qamhs Rachael Fee, MD   25 mg at 01/09/13 0816   Followed by  . [START ON 01/10/2013] chlordiazePOXIDE (LIBRIUM) capsule 25 mg  25 mg Oral Daily Rachael Fee, MD      . chlordiazePOXIDE (LIBRIUM) capsule 25 mg  25 mg Oral Once Rachael Fee, MD      . magnesium hydroxide (MILK OF MAGNESIA) suspension 30 mL  30 mL Oral Daily PRN Rachael Fee, MD      . multivitamin with minerals tablet 1 tablet  1 tablet Oral Daily Rachael Fee, MD   1 tablet at 01/09/13 [phone]  . thiamine (B-1) injection 100 mg  100 mg Intramuscular Once [name],  MD      . thiamine (VITAMIN B-1) tablet 100 mg  100 mg Oral Daily Rachael Fee, MD   100 mg at 01/09/13 1610    Lab Results: No results found for this or any previous visit (from the past 48 hour(s)).  Physical Findings: AIMS: Facial and Oral Movements Muscles of Facial Expression: None, normal Lips and Perioral Area: None, normal Jaw: None, normal Tongue: None, normal,Extremity Movements Upper (arms, wrists, hands, fingers): None, normal Lower (legs, knees, ankles, toes): None, normal, Trunk Movements Neck, shoulders, hips: None, normal, Overall Severity Severity of abnormal movements (highest score from  questions above): None, normal Incapacitation due to abnormal movements: None, normal Patient's awareness of abnormal movements (rate only patient's report): No Awareness, Dental Status Current problems with teeth and/or dentures?: No Does patient usually wear dentures?: No  CIWA:  CIWA-Ar Total: 1 COWS:     Treatment Plan Summary: Daily contact with patient to assess and evaluate symptoms and progress in treatment Medication management  Plan: Supportive approach/coping skills/relapse prevention           Pursue detox           CBT/mindfulness           Identify rehab options  Medical Decision Making Problem Points:  Review of psycho-social stressors (1) Data Points:  Review of medication regiment & side effects (2)  I certify that inpatient services furnished can reasonably be expected to improve the patient's condition.   Lamon Rotundo A 01/09/2013, 4:39 PM

## 2013-01-09 NOTE — Progress Notes (Signed)
Pt has been in the dayroom watching TV and talking mainly with a male peer.  Pt states he is doing fine today.  He denies SI/HI/AV.  He reports no withdrawal symptoms at this time.  He says he wants long term treatment.  Pt makes his needs known to staff.  Support and encouragement offered.  Safety maintained with q15 minute checks.

## 2013-01-09 NOTE — [facility] Suicide Risk Assessment (Signed)
[facility] INPATIENT:  Family/Significant Other Suicide Prevention Education  Suicide Prevention Education:  Patient Refusal for Family/Significant Other Suicide Prevention Education: The patient Adam [name] has refused to provide written consent for family/significant other to be provided Family/Significant Other Suicide Prevention Education during admission and/or prior to discharge.  Physician notified.  SPE completed with pt. SPI pamphlet provided and pt encouraged to ask questions, talk about concerns, and share information with support network.    Adam [name], Adam [name] 01/09/2013, 9:32 AM

## 2013-01-09 NOTE — Progress Notes (Signed)
The focus of this group is to educate the patient on the purpose and policies of crisis stabilization and provide a format to answer questions about their admission.  The group details unit policies and expectations of patients while admitted. Patient attended and but had to leave because of his Child psychotherapist.

## 2013-01-09 NOTE — Progress Notes (Signed)
Recreation Therapy Notes  Date: 10.28.2014 Time: 2:30pm Location: [address] Dayroom  Group Topic: Software engineer Activities (AAA)  Behavioral Response: Engaged, Attentive, Appropriate   Affect: Euthymic  Clinical Observations/Feedback: Dog Team: [employer]. Patient interacted appropriately with peer, dog team, LRT and MHT.   Marykay Lex Mahealani Sulak, LRT/CTRS  Jearl Klinefelter 01/09/2013 4:57 PM

## 2013-01-10 DIAGNOSIS — F1010 Alcohol abuse, uncomplicated: Secondary | ICD-10-CM

## 2013-01-10 DIAGNOSIS — F332 Major depressive disorder, recurrent severe without psychotic features: Secondary | ICD-10-CM

## 2013-01-10 DIAGNOSIS — F1910 Other psychoactive substance abuse, uncomplicated: Secondary | ICD-10-CM

## 2013-01-10 MED ORDER — HYDROXYZINE HCL 25 MG PO TABS: 25.0000 mg | ORAL_TABLET | Freq: Four times a day (QID) | ORAL | Status: DC | PRN

## 2013-01-10 MED ORDER — TRAZODONE HCL 50 MG PO TABS: 50.0000 mg | ORAL_TABLET | Freq: Every evening | ORAL | Status: DC | PRN

## 2013-01-10 NOTE — Progress Notes (Signed)
Adult Psychoeducational Group Note  Date:  01/10/2013 Time:  7:00 PM  Group Topic/Focus:  Personal Choices and Values:   The focus of this group is to help patients assess and explore the importance of values in their lives, how their values affect their decisions, how they express their values and what opposes their expression.  Participation Level:  Active  Participation Quality:  Appropriate and Attentive  Affect:  Appropriate  Cognitive:  Appropriate  Insight: Appropriate and Good  Engagement in Group:  Engaged  Modes of Intervention:  Discussion, Education, Socialization and Support  Additional Comments:  [name]  participated in group and shared. Patient completed worksheets in workbook with peers on identifying values and living a value oriented life. Voicing what values were important to patient and stated ways to improve choices that aren't healthy.    [name] 01/10/2013, 7:00 PM

## 2013-01-10 NOTE — [facility] Group Notes (Signed)
[facility] LCSW Group Therapy  01/10/2013 2:49 PM  Type of Therapy:  Group Therapy  Participation Level:  Active  Participation Quality:  Attentive  Affect:  Appropriate  Cognitive:  Alert and Oriented  Insight:  Engaged  Engagement in Therapy:  Engaged  Modes of Intervention:  Discussion, Education, Exploration, Socialization and Support  Summary of Progress/Problems: Emotion Regulation: This group focused on both positive and negative emotion identification and allowed group members to process ways to identify feelings, regulate negative emotions, and find healthy ways to manage internal/external emotions. Group members were asked to reflect on a time when their reaction to an emotion led to a negative outcome and explored how alternative responses using emotion regulation would have benefited them. Group members were also asked to discuss a time when emotion regulation was utilized when a negative emotion was experienced. Adam [name] was engaged and attentive throughout today's therapy group. Adam [name] stated that he could relate to other patients that shared their struggles with shame and guilt after relapsing and coming to [facility]. Adam [name] stated that he had been to [facility] three times in one month. Adam [name] shows progress in the group setting and improving insight AEB his ability to acknowledge his lack of motivation during the two previous admissions. "I need to try something different. I'm going to get i/p treatment at [facility]. I want to beat this and break the cycle of coming to [facility] for treatment."    Smart, Shalana Jardin 01/10/2013, 2:49 PM

## 2013-01-10 NOTE — Progress Notes (Signed)
**Note Adam-Identified via Obfuscation** Patient ID: Adam [name], male   DOB: June 27, 1985, 27 y.o.   MRN: [id number] He has been discharged home and ws given a bus pass. He stated that he was going to a friends house. He denies though of wanting to harm self and all belongings were taken home with  Him. He voiced understanding of discharge instruction and of follow up plan.

## 2013-01-10 NOTE — Progress Notes (Signed)
[facility] Adult Case Management Discharge Plan :  Will you be returning to the same living situation after discharge: No. Staying with friend until [facility] admission.  At discharge, do you have transportation home?:Yes,  bus pass in chart Do you have the ability to pay for your medications:Yes,  mental health  Release of information consent forms completed and in the chart;  Patient's signature needed at discharge.  Patient to Follow up at: Follow-up Information   Follow up with [facility] On 01/16/2013. (Arrive by 8AM for screening. Be sure to bring ID, medications, and clothing. You may be accepted for treatment after screening. )    Contact information:   [address] [place], [place] [address] Phone: [phone] Fax: [phone]      Follow up with [facility] (Walk in between 8am-9am Monday through Friday for hospital followup. )    Contact information:   [address], [place] [address] Phone: [phone] Fax: [phone]      Patient denies SI/HI:   Yes,  during group/self report.    Safety Planning and Suicide Prevention discussed:  Yes,  Pt refused to consent to family contact. SPE completed with pt and he was provided with SPI pamphlet, encouraged to ask questions, talk about concerns, and share information with his support network.   Smart, Leata Dominy 01/10/2013, 10:50 AM

## 2013-01-10 NOTE — [facility] Group Notes (Signed)
[name] LCSW Aftercare Discharge Planning Group Note   01/10/2013 9:30 AM  Participation Quality:  Appropriate   Mood/Affect:  Flat  Depression Rating:  0  Anxiety Rating:  0  Thoughts of Suicide:  No Will you contract for safety?   NA  Current AVH:  No  Plan for Discharge/Comments:  Pt stated that he plans to stay with a friend (he and his girlfriend recently broke up) until his [name] screening and possible admission on Tues 11/4. Pt will follow up at [facility] for med management.   Transportation Means: bus pass given.   Supports: friends/limited family supports.   Adam [name], [employer]

## 2013-01-10 NOTE — Discharge Instructions (Signed)
Chemical Dependency °Chemical dependency is an addiction to drugs or alcohol. It is characterized by the repeated behavior of seeking out and using drugs and alcohol despite harmful consequences to the health and safety of ones self and others.  °RISK FACTORS °There are certain situations or behaviors that increase a person's risk for chemical dependency. These include: °· A family history of chemical dependency. °· A history of mental health issues, including depression and anxiety. °· A home environment where drugs and alcohol are easily available to you. °· Drug or alcohol use at a young age. °SYMPTOMS  °The following symptoms can indicate chemical dependency: °· Inability to limit the use of drugs or alcohol. °· Nausea, sweating, shakiness, and anxiety that occurs when alcohol or drugs are not being used. °· An increase in amount of drugs or alcohol that is necessary to get drunk or high. °People who experience these symptoms can assess their use of drugs and alcohol by asking themselves the following questions: °· Have you been told by friends or family that they are worried about your use of alcohol or drugs? °· Do friends and family ever tell you about things you did while drinking alcohol or using drugs that you do not remember? °· Do you lie about using alcohol or drugs or about the amounts you use? °· Do you have difficulty completing daily tasks unless you use alcohol or drugs? °· Is the level of your work or school performance lower because of your drug or alcohol use? °· Do you get sick from using drugs or alcohol but keep using anyway? °· Do you feel uncomfortable in social situations unless you use alcohol or drugs? °· Do you use drugs or alcohol to help forget problems?  °An answer of yes to any of these questions may indicate chemical dependency. Professional evaluation is suggested. °Document Released: 02/23/2001 Document Revised: 05/24/2011 Document Reviewed: 05/07/2010 °ExitCare® Patient  Information ©2014 [employer]. ° °
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
## 2013-01-10 NOTE — Progress Notes (Signed)
Recreation Therapy Notes  Date: 10.28.2014 Time: 3:00pm Location: 300 [address] Dayroom  Group Topic: Self-Esteem  Goal Area(s) Addresses:  Patient will identify positive ways to increase self-esteem. Patient will verbalize benefit of increased self-esteem.  Behavioral Response:  Appropriate, Redirectable  Intervention: Game  Activity: Self-esteem Spin the Bottle. LRT provided a empty water bottle with a + on one end and a - on the other. In turn patients spun the bottle, if the bottle stopped facing the patient showing a + sign patients were asked to state two strengths about themselves. If the bottle stopped facing the patient showing a - patients were asked to state two things they would like to work on.   Education:  Self-esteem, Discharge Planning.   Education Outcome: Acknowledges understanding  Clinical Observations/Feedback: Patient actively engaged in group activity. Patient was required to state areas of improvement about himself, identifying his patience and his drinking. Patient identified that his lack of patience at times fuels his drinking, which in turn effects his self-esteem. Patient made no additional contributions to group discussion, but appeared to actively listen as he maintained appropriate eye contact with speaker.   Patient was observed to have side conversations with male peer throughout session. Patient would redirect when asked, but needed several prompts.   Marykay Lex Adea Geisel, LRT/CTRS  Hayli Milligan L 01/10/2013 8:44 AM

## 2013-01-12 NOTE — Progress Notes (Signed)
Patient Discharge Instructions:  After Visit Summary (AVS):   Faxed to:  01/12/13 Discharge Summary Note:   Faxed to:  01/12/13 Psychiatric Admission Assessment Note:   Faxed to:  01/12/13 Suicide Risk Assessment - Discharge Assessment:   Faxed to:  01/12/13 Faxed/Sent to the Next Level Care provider:  01/12/13 Faxed to [facility] @ [phone] Faxed to [facility] @ [phone]  Jerelene Redden, 01/12/2013, 1:36 PM

## 2013-02-16 ENCOUNTER — Emergency Department (HOSPITAL_BASED_OUTPATIENT_CLINIC_OR_DEPARTMENT_OTHER)
Admission: EM | Admit: 2013-02-16 | Discharge: 2013-02-16 | Disposition: A | Discharge status: Home / Self Care | Payer: Self-pay | Attending: Emergency Medicine | Admitting: Emergency Medicine

## 2013-02-16 ENCOUNTER — Encounter (HOSPITAL_BASED_OUTPATIENT_CLINIC_OR_DEPARTMENT_OTHER): Payer: Self-pay | Admitting: Emergency Medicine

## 2013-02-16 ENCOUNTER — Emergency Department (HOSPITAL_BASED_OUTPATIENT_CLINIC_OR_DEPARTMENT_OTHER): Payer: Self-pay

## 2013-02-16 DIAGNOSIS — R011 Cardiac murmur, unspecified: Secondary | ICD-10-CM | POA: Insufficient documentation

## 2013-02-16 DIAGNOSIS — M79609 Pain in unspecified limb: Secondary | ICD-10-CM | POA: Insufficient documentation

## 2013-02-16 DIAGNOSIS — Z8719 Personal history of other diseases of the digestive system: Secondary | ICD-10-CM | POA: Insufficient documentation

## 2013-02-16 DIAGNOSIS — F329 Major depressive disorder, single episode, unspecified: Secondary | ICD-10-CM | POA: Insufficient documentation

## 2013-02-16 DIAGNOSIS — G8911 Acute pain due to trauma: Secondary | ICD-10-CM | POA: Insufficient documentation

## 2013-02-16 DIAGNOSIS — Z79899 Other long term (current) drug therapy: Secondary | ICD-10-CM | POA: Insufficient documentation

## 2013-02-16 DIAGNOSIS — F17200 Nicotine dependence, unspecified, uncomplicated: Secondary | ICD-10-CM | POA: Insufficient documentation

## 2013-02-16 DIAGNOSIS — M79604 Pain in right leg: Secondary | ICD-10-CM

## 2013-02-16 DIAGNOSIS — F3289 Other specified depressive episodes: Secondary | ICD-10-CM | POA: Insufficient documentation

## 2013-02-16 HISTORY — DX: Accidental discharge from unspecified firearms or gun, initial encounter: W34.00XA

## 2013-02-16 MED ORDER — IBUPROFEN 600 MG PO TABS: 600.0000 mg | ORAL_TABLET | Freq: Four times a day (QID) | ORAL | Status: DC | PRN

## 2013-02-16 MED ORDER — HYDROCODONE-ACETAMINOPHEN 5-325 MG PO TABS
2.0000 | ORAL_TABLET | Freq: Once | ORAL | Status: AC
  Administered 2013-02-16: 2 via ORAL
  Filled 2013-02-16: qty 2

## 2013-02-16 NOTE — ED Notes (Signed)
MD at bedside. 

## 2013-02-16 NOTE — ED Notes (Signed)
Patient transported to X-ray 
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
## 2013-02-20 ENCOUNTER — Encounter (HOSPITAL_COMMUNITY): Payer: Self-pay | Admitting: *Deleted

## 2013-02-20 ENCOUNTER — Emergency Department (HOSPITAL_COMMUNITY)
Admission: EM | Admit: 2013-02-20 | Discharge: 2013-02-20 | Disposition: A | Discharge status: Other Acute Inpatient Hospital | Payer: Self-pay | Attending: Emergency Medicine | Admitting: Emergency Medicine

## 2013-02-20 ENCOUNTER — Inpatient Hospital Stay (HOSPITAL_COMMUNITY)
Admission: AD | Admit: 2013-02-20 | Discharge: 2013-02-22 | DRG: 897 | Disposition: A | Discharge status: Home / Self Care | Payer: Federal, State, Local not specified - Other | Source: Intra-hospital | Attending: Psychiatry | Admitting: Psychiatry

## 2013-02-20 ENCOUNTER — Encounter (HOSPITAL_COMMUNITY): Payer: Self-pay | Admitting: Emergency Medicine

## 2013-02-20 DIAGNOSIS — R011 Cardiac murmur, unspecified: Secondary | ICD-10-CM | POA: Insufficient documentation

## 2013-02-20 DIAGNOSIS — Z79899 Other long term (current) drug therapy: Secondary | ICD-10-CM | POA: Insufficient documentation

## 2013-02-20 DIAGNOSIS — F1410 Cocaine abuse, uncomplicated: Secondary | ICD-10-CM | POA: Insufficient documentation

## 2013-02-20 DIAGNOSIS — F1020 Alcohol dependence, uncomplicated: Principal | ICD-10-CM | POA: Diagnosis present

## 2013-02-20 DIAGNOSIS — F1420 Cocaine dependence, uncomplicated: Secondary | ICD-10-CM | POA: Diagnosis present

## 2013-02-20 DIAGNOSIS — IMO0002 Reserved for concepts with insufficient information to code with codable children: Secondary | ICD-10-CM | POA: Insufficient documentation

## 2013-02-20 DIAGNOSIS — F329 Major depressive disorder, single episode, unspecified: Secondary | ICD-10-CM | POA: Insufficient documentation

## 2013-02-20 DIAGNOSIS — Z87828 Personal history of other (healed) physical injury and trauma: Secondary | ICD-10-CM | POA: Insufficient documentation

## 2013-02-20 DIAGNOSIS — F1910 Other psychoactive substance abuse, uncomplicated: Secondary | ICD-10-CM

## 2013-02-20 DIAGNOSIS — R45850 Homicidal ideations: Secondary | ICD-10-CM | POA: Insufficient documentation

## 2013-02-20 DIAGNOSIS — F1010 Alcohol abuse, uncomplicated: Secondary | ICD-10-CM | POA: Insufficient documentation

## 2013-02-20 DIAGNOSIS — R45851 Suicidal ideations: Secondary | ICD-10-CM | POA: Insufficient documentation

## 2013-02-20 DIAGNOSIS — F3289 Other specified depressive episodes: Secondary | ICD-10-CM | POA: Insufficient documentation

## 2013-02-20 DIAGNOSIS — Z8719 Personal history of other diseases of the digestive system: Secondary | ICD-10-CM | POA: Insufficient documentation

## 2013-02-20 DIAGNOSIS — F1994 Other psychoactive substance use, unspecified with psychoactive substance-induced mood disorder: Secondary | ICD-10-CM | POA: Diagnosis present

## 2013-02-20 DIAGNOSIS — F17200 Nicotine dependence, unspecified, uncomplicated: Secondary | ICD-10-CM | POA: Insufficient documentation

## 2013-02-20 LAB — CBC WITH DIFFERENTIAL/PLATELET
HCT: 44.2 % (ref 39.0–52.0)
Hemoglobin: 16.1 g/dL (ref 13.0–17.0)
Lymphocytes Relative: 28 % (ref 12–46)
MCH: 30.3 pg (ref 26.0–34.0)
MCHC: 36.4 g/dL — ABNORMAL HIGH (ref 30.0–36.0)
MCV: 83.1 fL (ref 78.0–100.0)
Monocytes Absolute: 0.7 10*3/uL (ref 0.1–1.0)
Monocytes Relative: 7 % (ref 3–12)
Neutro Abs: 6.1 10*3/uL (ref 1.7–7.7)
Platelets: 242 10*3/uL (ref 150–400)
WBC: 9.5 10*3/uL (ref 4.0–10.5)

## 2013-02-20 LAB — COMPREHENSIVE METABOLIC PANEL
ALT: 17 U/L (ref 0–53)
AST: 26 U/L (ref 0–37)
Albumin: 4 g/dL (ref 3.5–5.2)
Calcium: 8.9 mg/dL (ref 8.4–10.5)
GFR calc Af Amer: >90 mL/min (ref >90)
Glucose, Bld: 103 mg/dL — ABNORMAL HIGH (ref 70–99)
Sodium: 141 mEq/L (ref 135–145)
Total Bilirubin: 0.3 mg/dL (ref 0.3–1.2)
Total Protein: 7.8 g/dL (ref 6.0–8.3)

## 2013-02-20 LAB — RAPID URINE DRUG SCREEN, HOSP PERFORMED
Barbiturates: NOT DETECTED
Benzodiazepines: NOT DETECTED
Cocaine: POSITIVE — AB
Tetrahydrocannabinol: NOT DETECTED

## 2013-02-20 LAB — SALICYLATE LEVEL: Salicylate Lvl: <2 mg/dL — ABNORMAL LOW (ref 2.8–20.0)

## 2013-02-20 LAB — ETHANOL: Alcohol, Ethyl (B): 254 mg/dL — ABNORMAL HIGH (ref 0–11)

## 2013-02-20 MED ORDER — ADULT MULTIVITAMIN W/MINERALS CH
1.0000 | ORAL_TABLET | Freq: Every day | ORAL | Status: DC
  Administered 2013-02-20 – 2013-02-22 (×3): 1 via ORAL
  Filled 2013-02-20 (×6): qty 1

## 2013-02-20 MED ORDER — ALUM & MAG HYDROXIDE-SIMETH 200-200-20 MG/5ML PO SUSP: 30.0000 mL | ORAL | Status: DC | PRN

## 2013-02-20 MED ORDER — ONDANSETRON 4 MG PO TBDP: 4.0000 mg | ORAL_TABLET | Freq: Four times a day (QID) | ORAL | Status: DC | PRN

## 2013-02-20 MED ORDER — THIAMINE HCL 100 MG/ML IJ SOLN
100.0000 mg | Freq: Once | INTRAMUSCULAR | Status: AC
  Administered 2013-02-20: 13:00:00 via INTRAMUSCULAR
  Filled 2013-02-20: qty 2

## 2013-02-20 MED ORDER — ZIPRASIDONE MESYLATE 20 MG IM SOLR: 20.0000 mg | Freq: Once | INTRAMUSCULAR | Status: DC

## 2013-02-20 MED ORDER — ACETAMINOPHEN 325 MG PO TABS
650.0000 mg | ORAL_TABLET | Freq: Four times a day (QID) | ORAL | Status: DC | PRN
  Administered 2013-02-20: 650 mg via ORAL
  Filled 2013-02-20: qty 2

## 2013-02-20 MED ORDER — VITAMIN B-1 100 MG PO TABS
100.0000 mg | ORAL_TABLET | Freq: Every day | ORAL | Status: DC
  Administered 2013-02-21 – 2013-02-22 (×2): 100 mg via ORAL
  Filled 2013-02-20 (×4): qty 1

## 2013-02-20 MED ORDER — CHLORDIAZEPOXIDE HCL 25 MG PO CAPS
25.0000 mg | ORAL_CAPSULE | Freq: Three times a day (TID) | ORAL | Status: AC
  Administered 2013-02-21 (×3): 25 mg via ORAL
  Filled 2013-02-20 (×3): qty 1

## 2013-02-20 MED ORDER — CHLORDIAZEPOXIDE HCL 25 MG PO CAPS
25.0000 mg | ORAL_CAPSULE | ORAL | Status: DC
  Administered 2013-02-22: 25 mg via ORAL
  Filled 2013-02-20: qty 1

## 2013-02-20 MED ORDER — NICOTINE 21 MG/24HR TD PT24: 21.0000 mg | MEDICATED_PATCH | Freq: Every day | TRANSDERMAL | Status: DC

## 2013-02-20 MED ORDER — LORAZEPAM 1 MG PO TABS: 1.0000 mg | ORAL_TABLET | Freq: Three times a day (TID) | ORAL | Status: DC | PRN

## 2013-02-20 MED ORDER — HYDROXYZINE HCL 25 MG PO TABS: 25.0000 mg | ORAL_TABLET | Freq: Four times a day (QID) | ORAL | Status: DC | PRN

## 2013-02-20 MED ORDER — CHLORDIAZEPOXIDE HCL 25 MG PO CAPS
25.0000 mg | ORAL_CAPSULE | Freq: Once | ORAL | Status: DC
Start: 2013-02-20 — End: 2013-02-22
  Filled 2013-02-20: qty 1

## 2013-02-20 MED ORDER — IBUPROFEN 400 MG PO TABS: 600.0000 mg | ORAL_TABLET | Freq: Three times a day (TID) | ORAL | Status: DC | PRN

## 2013-02-20 MED ORDER — ONDANSETRON HCL 4 MG PO TABS
4.0000 mg | ORAL_TABLET | Freq: Three times a day (TID) | ORAL | Status: DC | PRN
Start: 2013-02-20 — End: 2013-02-20

## 2013-02-20 MED ORDER — NICOTINE 21 MG/24HR TD PT24
21.0000 mg | MEDICATED_PATCH | Freq: Every day | TRANSDERMAL | Status: DC
  Filled 2013-02-20 (×3): qty 1

## 2013-02-20 MED ORDER — CHLORDIAZEPOXIDE HCL 25 MG PO CAPS
25.0000 mg | ORAL_CAPSULE | Freq: Four times a day (QID) | ORAL | Status: AC
  Administered 2013-02-20 (×3): 25 mg via ORAL
  Filled 2013-02-20 (×2): qty 1

## 2013-02-20 MED ORDER — TRAZODONE HCL 50 MG PO TABS
50.0000 mg | ORAL_TABLET | Freq: Every evening | ORAL | Status: DC | PRN
  Administered 2013-02-20 – 2013-02-21 (×2): 50 mg via ORAL
  Filled 2013-02-20 (×2): qty 1
  Filled 2013-02-20: qty 14

## 2013-02-20 MED ORDER — MAGNESIUM HYDROXIDE 400 MG/5ML PO SUSP: 30.0000 mL | Freq: Every day | ORAL | Status: DC | PRN

## 2013-02-20 MED ORDER — ZIPRASIDONE MESYLATE 20 MG IM SOLR: 20.0000 mg | Freq: Two times a day (BID) | INTRAMUSCULAR | Status: DC | PRN

## 2013-02-20 MED ORDER — ZOLPIDEM TARTRATE 5 MG PO TABS: 5.0000 mg | ORAL_TABLET | Freq: Every evening | ORAL | Status: DC | PRN

## 2013-02-20 MED ORDER — LOPERAMIDE HCL 2 MG PO CAPS: 2.0000 mg | ORAL_CAPSULE | ORAL | Status: DC | PRN

## 2013-02-20 MED ORDER — CHLORDIAZEPOXIDE HCL 25 MG PO CAPS: 25.0000 mg | ORAL_CAPSULE | Freq: Every day | ORAL | Status: DC

## 2013-02-20 MED ORDER — CHLORDIAZEPOXIDE HCL 25 MG PO CAPS: 25.0000 mg | ORAL_CAPSULE | Freq: Four times a day (QID) | ORAL | Status: DC | PRN

## 2013-02-20 NOTE — Progress Notes (Signed)
Adult Psychoeducational Group Note  Date:  02/20/2013 Time:  11:31 AM  Group Topic/Focus:  Recovery Goals:   The focus of this group is to identify appropriate goals for recovery and establish a plan to achieve them.  Participation Level:  Active  Participation Quality:  Appropriate  Affect:  Appropriate  Cognitive:  Appropriate  Insight: Appropriate  Engagement in Group:  Engaged  Modes of Intervention:  Activity  Additional Comments:  Patient was actively engaged in watching a movie :[facility] before Recovery- first steps of AA  Bing Plume D 02/20/2013, 11:31 AM

## 2013-02-20 NOTE — ED Notes (Signed)
Called jon cook to check on pt belongings. Message left.  Called david hinson to check on pt belongings. Message left

## 2013-02-20 NOTE — Progress Notes (Signed)
B.Stevens Magwood, MHT attempted to notify patient of acceptance to [facility] and obtain support paper work, patient sound a sleep and has been to drowsy to an unable to stay awake. Writer will pass off to oncoming disposition tech.

## 2013-02-20 NOTE — ED Notes (Signed)
1 labeled of belongings found in Pod B under blankets by Dustin Flock, RN. This RN advised [facility] to please advise pt his belongings have been located. Security taking to [facility]

## 2013-02-20 NOTE — Progress Notes (Signed)
Adult Psychoeducational Group Note  Date:  02/20/2013 Time:  11:00AM Group Topic/Focus:  Recovery Goals:   The focus of this group is to identify appropriate goals for recovery and establish a plan to achieve them.  Participation Level:  Did Not Attend  Additional Comments: Pt. Didn't attend group.   Adam [name] Jvette 02/20/2013, 11:22 AM

## 2013-02-20 NOTE — ED Notes (Addendum)
Pt at registration, wanting to check in for suicidal thoughts, appears intoxicated, speech rambling & slightly slurred, speaking to everyone around and passing by excessively loud & manic. polite and complimentary of elderly gentleman passing by, defensive and questioning RNs instructions to room, mentions: overdose, taking/ using cocaine, ETOH, methadone, crack, heroin, shooting self, asking for [place] to "stick a needle a needle in his neck and drain all the blood out so he will die", pt talking about a girl doing him wrong. AC, CN, security, [employer] notifed, security called to wand, "sitter will be sent", taken to room, belongings removed placed in 1 bag and labeled outside of room, wallet w/o cash in it, all clothes and belongings soaking wet, room secured & free of lines/cables/trashcans. Pt slow to respond, slow to acknowledge, needs much prompting and directing, semi-cooperative, mentions "being a black man in a white man's world".

## 2013-02-20 NOTE — ED Notes (Signed)
Patient arrives with complaint of SI starting this evening caused by argument with girlfriend. Also has somewhat disorganized thought patterns during triage.
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
## 2013-02-20 NOTE — Progress Notes (Signed)
Patient ID: Adam [name], male   DOB: [birth year], 27 y.o.   MRN: [id number] Patient came in with SI with a plan to overdose but now denies SI/HI and A/V hallucinations; patien reports that he was released from [facility] on Friday 02/16/13 and relapsed that day; patient also got into an argument with his girlfriend; uds was positive for cocaine and etoh level was 254; patient reports no withdrawal symptoms at this time; heart rate is 127 when standing and report was given to [employer]

## 2013-02-20 NOTE — ED Provider Notes (Signed)
Medical screening examination/treatment/procedure(s) were performed by non-physician practitioner and as supervising physician I was immediately available for consultation/collaboration.    Sunnie Nielsen, MD 02/20/13 2258

## 2013-02-20 NOTE — Progress Notes (Signed)
Recreation Therapy Notes  Date: 12.09.2014 Time: 3:00pm Location: [address] Dayroom   Group Topic: Communication, Team Building, Problem Solving  Goal Area(s) Addresses:  Patient will effectively work with peer towards shared goal.  Patient will identify skill used to make activity successful.  Patient will identify how skills used during activity can be used to reach post d/c goals.   Behavioral Response: Did not attend.   Marykay Lex Jamyla Ard, LRT/CTRS  Jearl Klinefelter 02/20/2013 4:57 PM
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
## 2013-02-20 NOTE — Progress Notes (Signed)
Patient ID: Adam [name], male   DOB: April 15, 1985, 27 y.o.   MRN: [id number] He has been up and  to group and to lunch. C/O of generalized pain  And tylenol was given.  He has been calm and cooperative.

## 2013-02-20 NOTE — Progress Notes (Signed)
Recreation Therapy Notes  Animal-Assisted Activity/Therapy (AAA/T) Program Checklist/Progress Notes Patient Eligibility Criteria Checklist & Daily Group note for Rec Tx Intervention  Date: 12.09.2014  Time: 2:30pm Location: [address] Dayroom    AAA/T Program Assumption of Risk Form signed by Patient/ or Parent Legal Guardian yes  Patient is free of allergies or sever asthma  yes  Patient reports no fear of animals yes  Patient reports no history of cruelty to animals yes  Patient understands his/her participation is voluntary yes  Patient washes hands before animal contact yes  Patient washes hands after animal contact yes  Behavioral Response: Did not attend.   Yoskar Murrillo L Aries Townley, LRT/CTRS  Keaden Gunnoe L 02/20/2013 4:20 PM 

## 2013-02-20 NOTE — ED Notes (Signed)
[facility] counselor called to inform of reason for lab delays. [facility] counselor to complete telepsych consult soon.

## 2013-02-20 NOTE — Progress Notes (Signed)
[id number] Writer completed support forms and they have been faxed to [facility]. Attending nurse Kriste Basque has been notified of bed placement and provided the original support forms. -T.Geana Walts, MHT

## 2013-02-20 NOTE — Progress Notes (Signed)
Received hand-off report from MHT. The Writer attempted to inform patient of his acceptance to [facility]. Patient was sleeping. Writer will attempt to have support forms signed at a later time and notified nurse. Clare Gandy, MHT
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
## 2013-02-20 NOTE — Progress Notes (Signed)
Patient ID: Adam [name], male   DOB: 05/25/1985, 27 y.o.   MRN: [id number]  D: Pt was pleasant and cooperative. Asked the writer, "do you remember me?" After brief conversation pt informed the writer that after discharge from [facility] apprx 1 month ago, he went to [facility]. Stated that after his discharge from [facility] on Friday he called a "84 yr woman that he met in here". Stated, "to be 70 she is fine and has money".  Stated that "the lady" has completed [facility] and has "gotten herself together". However, pt "understands that he needs to get himself together".   A:  Support and encouragement was offered. 15 min checks continued for safety.  R: Pt remains safe.

## 2013-02-20 NOTE — Tx Team (Signed)
Initial Interdisciplinary Treatment Plan  PATIENT STRENGTHS: (choose at least two) Ability for insight Capable of independent living General fund of knowledge Physical Health  PATIENT STRESSORS: Financial difficulties Substance abuse   PROBLEM LIST: Problem List/Patient Goals Date to be addressed Date deferred Reason deferred Estimated date of resolution  Substance Abuse 02/20/2013     Depression 02/20/2013                                                DISCHARGE CRITERIA:  Improved stabilization in mood, thinking, and/or behavior Need for constant or close observation no longer present Reduction of life-threatening or endangering symptoms to within safe limits Withdrawal symptoms are absent or subacute and managed without 24-hour nursing intervention  PRELIMINARY DISCHARGE PLAN: Attend 12-step recovery group Outpatient therapy Placement in alternative living arrangements Return to previous living arrangement  PATIENT/FAMIILY INVOLVEMENT: This treatment plan has been presented to and reviewed with the patient, Adam [name].  The patient and family have been given the opportunity to ask questions and make suggestions.  Adam [name] 02/20/2013, 11:21 AM

## 2013-02-20 NOTE — [facility] Group Notes (Signed)
[facility] LCSW Group Therapy  02/20/2013 1:34 PM  Type of Therapy:  Group Therapy  Participation Level:  Active  Participation Quality:  Attentive  Affect:  Depressed and Flat  Cognitive:  Oriented  Insight:  Improving  Engagement in Therapy:  Improving  Modes of Intervention:  Confrontation, Discussion, Education, Exploration, Problem-solving, Socialization and Support  Summary of Progress/Problems: [name] came to talk about his personal journey with substance abuse and addiction. The pt processed ways by which to relate to the speaker. [name] provided handouts and educational information pertaining to groups and services offered by the [facility]. Adam [name] was attentive and engaged throughout today's therapy group. He actively listened as speaker shared his persona story and reviewed various services and resources offered by the [employer]. Slate thanked the speaker for coming at the end of group.    Smart, Ericia Moxley LCSWA 02/20/2013, 1:34 PM

## 2013-02-20 NOTE — [facility] Group Notes (Signed)
Adult Psychoeducational Group Note  Date:  02/20/2013 Time:  9:55 PM  Group Topic/Focus:  AA Meeting  Participation Level:  Minimal  Participation Quality:  Appropriate  Affect:  Appropriate  Cognitive:  Appropriate  Insight: Appropriate  Engagement in Group:  Engaged  Modes of Intervention:  Discussion and Education  Additional Comments:  Abron attended [employer].  Caroll Rancher A 02/20/2013, 9:55 PM
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
## 2013-02-20 NOTE — ED Notes (Signed)
Patient refused to have temperature taken when transferred. Pt. Was very sleepy and groggy and would continue to pull away when I attempted to obtain the temperature. Notified RN. Last temp was taken at 0409hrs and was 97.9 F

## 2013-02-20 NOTE — [facility] Suicide Risk Assessment (Signed)
Suicide Risk Assessment  Admission Assessment     Nursing information obtained from:  Patient Demographic factors:  Male;Adolescent or young adult;Low socioeconomic status;Unemployed Current Mental Status:  Self-harm thoughts Loss Factors:  Financial problems / change in socioeconomic status Historical Factors:  Family history of mental illness or substance abuse Risk Reduction Factors:  Religious beliefs about death;Living with another person, especially a relative  CLINICAL FACTORS:   Depression:   Comorbid alcohol abuse/dependence Alcohol/Substance Abuse/Dependencies  COGNITIVE FEATURES THAT CONTRIBUTE TO RISK:  Closed-mindedness Polarized thinking Thought constriction (tunnel vision)    SUICIDE RISK:   Moderate:  Frequent suicidal ideation with limited intensity, and duration, some specificity in terms of plans, no associated intent, good self-control, limited dysphoria/symptomatology, some risk factors present, and identifiable protective factors, including available and accessible social support.  PLAN OF CARE: Supportive approach/coping skills/relapse prevention                               Detox                                Reassess and address the co morbidities  I certify that inpatient services furnished can reasonably be expected to improve the patient's condition.  Markisha Meding A 02/20/2013, 5:26 PM

## 2013-02-21 DIAGNOSIS — F1410 Cocaine abuse, uncomplicated: Secondary | ICD-10-CM

## 2013-02-21 DIAGNOSIS — F1020 Alcohol dependence, uncomplicated: Principal | ICD-10-CM

## 2013-02-21 DIAGNOSIS — F1994 Other psychoactive substance use, unspecified with psychoactive substance-induced mood disorder: Secondary | ICD-10-CM

## 2013-02-21 NOTE — Tx Team (Signed)
Interdisciplinary Treatment Plan Update (Adult)  Date: 02/21/2013   Time Reviewed: 10:47 AM  Progress in Treatment:  Attending groups: Yes.  Participating in groups:  Yes  Taking medication as prescribed: Yes  Tolerating medication: Yes  Family/Significant othe contact made: No  Patient understands diagnosis: Yes, AEB seeking treatment for SI with plan, ETOH detox, crack cocaine abuse, and mood stabilization.  Discussing patient identified problems/goals with staff: Yes  Medical problems stabilized or resolved: Yes  Denies suicidal/homicidal ideation: Yes during group/self report.  Patient has not harmed self or Others: Yes  New problem(s) identified:  Discharge Plan or Barriers: Pt plans to follow up at [facility] and return home to live with his girlfriend. Pt states that he is no longer interested in going to an inpatient treatment facility, as he just completed 30 days at [facility]. Pt hoping for early d/c tomorrow. CSW informed pt that the MD must meet with him to determine d/c, time, etc.  Additional comments: This is a 27 year old African-American male. Admitted to [facility] from the [facility] ED with complaints of increased depression, suicidal ideation with plans to either shoot himself and or jump in front of a traffic. Patient reports, "I should not have left [name] last Friday. I was at [facility] for substance abuse treatment after I was discharged from this hospital. But I decided to leave [facility] last Friday, thinking that I could handle myself out there. I started drugging and drinking alcohol right away because I had some money in my pocket at the time. I drank and used x 3 days, then got depressed and suicidal. I was planning to jump in front a traffic and or shoot myself". Reason for Continuation of Hospitalization: Librium taper-withdrawls Mood stabilization  Estimated length of stay: 1-2 days  For review of initial/current patient  goals, please see plan of care.  Attendees:  Patient:    Family:    Physician:   Nursing: Janeece Riggers  02/21/2013 10:42 AM   Clinical Social Worker Evelyna Folker Smart, LCSWA  02/21/2013 10:42 AM   Other: Lowanda Foster RN  02/21/2013 10:46 AM   Other: Chandra Batch. PA 02/21/2013 10:46 AM   Other: Darden Dates Nurse CM 02/21/2013 10:46 AM   Other:    Scribe for Treatment Team:  [employer] LCSWA 02/21/2013 10:47 AM

## 2013-02-21 NOTE — Progress Notes (Signed)
Adult Psychoeducational Group Note  Date:  02/21/2013 Time:  1:54 PM  Group Topic/Focus:  Personal Choices and Values:   The focus of this group is to help patients assess and explore the importance of values in their lives, how their values affect their decisions, how they express their values and what opposes their expression.  Participation Level:  Minimal  Participation Quality:  Attentive  Affect:  Appropriate  Cognitive:  Oriented  Insight: Good and Improving  Engagement in Group:  Improving  Modes of Intervention:  Discussion and Support  Additional Comments: Pt's were encouraged to describe a time when they were happy and healthy. Pt described 2 1/2 years ago, "everything was in balance".  Reynolds Bowl 02/21/2013, 1:54 PM

## 2013-02-21 NOTE — Progress Notes (Signed)
Patient ID: Adam [name], male   DOB: 1985-06-16, 27 y.o.   MRN: [id number]   D: Stated he may be discharged tomorrow. States he has superior court date for "pills".  When asked his plans for discharge pt stated he plans to go home to "spend time with his girl" referring to his girlfriend. Then stated he's going to attempt to find a job to get himself "together."  A:  Support and encouragement was offered. 15 min checks continued for safety.  R: Pt remains safe.

## 2013-02-21 NOTE — [facility] Suicide Risk Assessment (Signed)
[facility] INPATIENT:  Family/Significant Other Suicide Prevention Education  Suicide Prevention Education:  Contact Attempts: Adam [name] (pt's girlfriend) [phone] has been identified by the patient as the family member/significant other with whom the patient will be residing, and identified as the person(s) who will aid the patient in the event of a mental health crisis.  With written consent from the patient, two attempts were made to provide suicide prevention education, prior to and/or following the patient's discharge.  We were unsuccessful in providing suicide prevention education.  A suicide education pamphlet was given to the patient to share with family/significant other.  Date and time of first attempt: 11:00AM 02/21/13 Date and time of second attempt: 2:50PM 02/21/13 (unable to leave voicemail-mailbox full)  Smart, [name]  02/21/2013, 2:49 PM

## 2013-02-21 NOTE — [facility] Group Notes (Signed)
[facility] LCSW Aftercare Discharge Planning Group Note   02/21/2013 9:48 AM  Participation Quality:  Appropriate   Mood/Affect:  Anxious  Depression Rating:  0  Anxiety Rating:  0  Thoughts of Suicide:  No Will you contract for safety?   NA  Current AVH:  No  Plan for Discharge/Comments:  Pt states that he and his gf reconciled last night. He reports that he is now ready for d/c and ready to take his sobriety seriously. Pt plans to follow up at [facility] for med management and assessment for therapy services. He states that he does not experience withdrawal symptoms and is hoping for Thursday d/c early in the morning. Pt refusing referral for inpatient treatment and asked CSW to cancel his request from yesterday to refer him to an i/p facility.   Transportation Means: girlfriend or bus   Supports: girlfriend is only identified Field seismologist, Oncologist

## 2013-02-21 NOTE — Progress Notes (Signed)
Adult Psychoeducational Group Note  Date:  02/21/2013 Time:  8:45 PM  Group Topic/Focus:  NA group  Participation Level:  Active  Participation Quality:  Appropriate  Affect:  Appropriate  Cognitive:  Alert  Insight: Appropriate  Engagement in Group:  Engaged  Modes of Intervention:  Discussion  Additional Comments:    Flonnie Hailstone 02/21/2013, 8:45 PM

## 2013-02-21 NOTE — Progress Notes (Signed)
Pt attended the pharmacy group.

## 2013-02-21 NOTE — [facility] Group Notes (Signed)
[facility] LCSW Group Therapy  02/21/2013 2:57 PM  Type of Therapy:  Group Therapy  Participation Level:  Active  Participation Quality:  Attentive  Affect:  Excited  Cognitive:  Alert and Oriented  Insight:  Engaged  Engagement in Therapy:  Engaged  Modes of Intervention:  Confrontation, Discussion, Education, Exploration, Problem-solving, Rapport Building, Socialization and Support  Summary of Progress/Problems: Emotion Regulation: This group focused on both positive and negative emotion identification and allowed group members to process ways to identify feelings, regulate negative emotions, and find healthy ways to manage internal/external emotions. Group members were asked to reflect on a time when their reaction to an emotion led to a negative outcome and explored how alternative responses using emotion regulation would have benefited them. Group members were also asked to discuss a time when emotion regulation was utilized when a negative emotion was experienced. Adam [name] was attentive and engaged throughout today's therapy group. He stated that he has difficulty with "hopelessness." Adam [name] stated that in the past when he feels this negative emotion, he turns to every kind of drug he can find. Adam [name] shows progress in the group setting AEB his ability to actively participate in group discussion. He demonstrates improving insight AEB his ability to identify a negative emotion that he struggles with, ways to regulate this emotion in a positive way, and identify one thing he plans to do each day for self care. "I need to learn my triggers-like boredom and come up with a schedule for myself to avoid boredom." Adam [name] stated he plans to take 30 minutes per day to read Adam [name] books.    Smart, Adam [name] LCSWA 02/21/2013, 2:57 PM

## 2013-02-21 NOTE — Progress Notes (Signed)
[facility] MD Progress Note  02/21/2013 2:30 PM Adam [name]  MRN:  [id number] Subjective:  States that he should know by now that he cant have the one beer. States that once he has the one beer, he cant stop. After 30 days at [facility] admits it just took one friend ask him to come with him and chill. States he was intending to have only one beer with him. The one beer led to another and another and then cocaine and then to the drop in mood Diagnosis:   DSM5: Schizophrenia Disorders:  denies Obsessive-Compulsive Disorders:  denies Trauma-Stressor Disorders:  denies Substance/Addictive Disorders:  Alcohol Related Disorder - Severe (303.90), Cocaine related disorders Depressive Disorders:  none  Axis I: Substance Induced Mood Disorder  ADL's:  Intact  Sleep: Fair  Appetite:  Fair  Suicidal Ideation:  Plan:  denies Intent:  denies Means:  denies Homicidal Ideation:  Plan:  denies Intent:  denies Means:  denies AEB (as evidenced by):  Psychiatric Specialty Exam: Review of Systems  Constitutional: Negative.   HENT: Negative.   Eyes: Negative.   Respiratory: Negative.   Cardiovascular: Negative.   Gastrointestinal: Negative.   Genitourinary: Negative.   Musculoskeletal: Negative.   Skin: Negative.   Neurological: Negative.   Endo/Heme/Allergies: Negative.   Psychiatric/Behavioral: Positive for substance abuse. The patient is nervous/anxious.     Blood pressure 136/87, pulse 80, temperature 97.7 F (36.5 C), temperature source Oral, resp. rate 20, height 5' 9.69" (1.77 m), weight 84.823 kg (187 lb), SpO2 100.00%.Body mass index is 27.07 kg/(m^2).  General Appearance: Fairly Groomed  Patent attorney::  Fair  Speech:  Clear and Coherent  Volume:  Normal  Mood:  Anxious and worried  Affect:  anxious, worried  Thought Process:  Coherent and Goal Directed  Orientation:  Full (Time, Place, and Person)  Thought Content:  worries, concerns  Suicidal Thoughts:  No  Homicidal Thoughts:   No  Memory:  Immediate;   Fair Recent;   Fair Remote;   Fair  Judgement:  Fair  Insight:  Shallow  Psychomotor Activity:  Restlessness  Concentration:  Fair  Recall:  Fair  Akathisia:  No  Handed:    AIMS (if indicated):     Assets:  Desire for Improvement  Sleep:  Number of Hours: 6.75   Current Medications: Current Facility-Administered Medications  Medication Dose Route Frequency Provider Last Rate Last Dose  . acetaminophen (TYLENOL) tablet 650 mg  650 mg Oral Q6H PRN Sanjuana Kava, NP   650 mg at 02/20/13 1252  . alum & mag hydroxide-simeth (MAALOX/MYLANTA) 200-200-20 MG/5ML suspension 30 mL  30 mL Oral Q4H PRN Sanjuana Kava, NP      . chlordiazePOXIDE (LIBRIUM) capsule 25 mg  25 mg Oral Q6H PRN Sanjuana Kava, NP      . chlordiazePOXIDE (LIBRIUM) capsule 25 mg  25 mg Oral Once Sanjuana Kava, NP      . chlordiazePOXIDE (LIBRIUM) capsule 25 mg  25 mg Oral TID Sanjuana Kava, NP   25 mg at 02/21/13 1157   Followed by  . [START ON 02/22/2013] chlordiazePOXIDE (LIBRIUM) capsule 25 mg  25 mg Oral BH-qamhs Sanjuana Kava, NP       Followed by  . [START ON 02/23/2013] chlordiazePOXIDE (LIBRIUM) capsule 25 mg  25 mg Oral Daily Sanjuana Kava, NP      . hydrOXYzine (ATARAX/VISTARIL) tablet 25 mg  25 mg Oral Q6H PRN Sanjuana Kava, NP      .  loperamide (IMODIUM) capsule 2-4 mg  2-4 mg Oral PRN Sanjuana Kava, NP      . magnesium hydroxide (MILK OF MAGNESIA) suspension 30 mL  30 mL Oral Daily PRN Sanjuana Kava, NP      . multivitamin with minerals tablet 1 tablet  1 tablet Oral Daily Sanjuana Kava, NP   1 tablet at 02/21/13 0825  . ondansetron (ZOFRAN-ODT) disintegrating tablet 4 mg  4 mg Oral Q6H PRN Sanjuana Kava, NP      . thiamine (VITAMIN B-1) tablet 100 mg  100 mg Oral Daily Sanjuana Kava, NP   100 mg at 02/21/13 0825  . traZODone (DESYREL) tablet 50 mg  50 mg Oral QHS PRN Sanjuana Kava, NP   50 mg at 02/20/13 2116    Lab Results:  Results for orders placed during the hospital  encounter of 02/20/13 (from the past 48 hour(s))  COMPREHENSIVE METABOLIC PANEL     Status: Abnormal   Collection Time    02/20/13  4:11 AM      Result Value Range   Sodium 141  135 - 145 mEq/L   Potassium 3.7  3.5 - 5.1 mEq/L   Chloride 100  96 - 112 mEq/L   CO2 26  19 - 32 mEq/L   Glucose, Bld 103 (*) 70 - 99 mg/dL   BUN 7  6 - 23 mg/dL   Creatinine, Ser 1.61  0.50 - 1.35 mg/dL   Calcium 8.9  8.4 - 09.6 mg/dL   Total Protein 7.8  6.0 - 8.3 g/dL   Albumin 4.0  3.5 - 5.2 g/dL   AST 26  0 - 37 U/L   ALT 17  0 - 53 U/L   Alkaline Phosphatase 67  39 - 117 U/L   Total Bilirubin 0.3  0.3 - 1.2 mg/dL   GFR calc non Af Amer >90  >90 mL/min   GFR calc Af Amer >90  >90 mL/min   Comment: (NOTE)     The eGFR has been calculated using the CKD EPI equation.     This calculation has not been validated in all clinical situations.     eGFR's persistently <90 mL/min signify possible Chronic Kidney     Disease.     Performed at [facility]  CBC WITH DIFFERENTIAL     Status: Abnormal   Collection Time    02/20/13  4:11 AM      Result Value Range   WBC 9.5  4.0 - 10.5 K/uL   RBC 5.32  4.22 - 5.81 MIL/uL   Hemoglobin 16.1  13.0 - 17.0 g/dL   HCT 04.5  40.9 - 81.1 %   MCV 83.1  78.0 - 100.0 fL   MCH 30.3  26.0 - 34.0 pg   MCHC 36.4 (*) 30.0 - 36.0 g/dL   RDW 91.4  78.2 - 95.6 %   Platelets 242  150 - 400 K/uL   Neutrophils Relative % 64  43 - 77 %   Neutro Abs 6.1  1.7 - 7.7 K/uL   Lymphocytes Relative 28  12 - 46 %   Lymphs Abs 2.6  0.7 - 4.0 K/uL   Monocytes Relative 7  3 - 12 %   Monocytes Absolute 0.7  0.1 - 1.0 K/uL   Eosinophils Relative 1  0 - 5 %   Eosinophils Absolute 0.1  0.0 - 0.7 K/uL   Basophils Relative 0  0 - 1 %  Basophils Absolute 0.0  0.0 - 0.1 K/uL  [name]     Status: Abnormal   Collection Time    02/20/13  4:11 AM      Result Value Range   Alcohol, Ethyl (B) 254 (*) 0 - 11 mg/dL   Comment:            LOWEST DETECTABLE LIMIT FOR     SERUM  ALCOHOL IS 11 mg/dL     FOR MEDICAL PURPOSES ONLY     Performed at [facility]  ACETAMINOPHEN LEVEL     Status: None   Collection Time    02/20/13  4:11 AM      Result Value Range   Acetaminophen (Tylenol), Serum <15.0  10 - 30 ug/mL   Comment:            THERAPEUTIC CONCENTRATIONS VARY     SIGNIFICANTLY. A RANGE OF 10-30     ug/mL MAY BE AN EFFECTIVE     CONCENTRATION FOR MANY PATIENTS.     HOWEVER, SOME ARE BEST TREATED     AT CONCENTRATIONS OUTSIDE THIS     RANGE.     ACETAMINOPHEN CONCENTRATIONS     >150 ug/mL AT 4 HOURS AFTER     INGESTION AND >50 ug/mL AT 12     HOURS AFTER INGESTION ARE     OFTEN ASSOCIATED WITH TOXIC     REACTIONS.  SALICYLATE LEVEL     Status: Abnormal   Collection Time    02/20/13  4:11 AM      Result Value Range   Salicylate Lvl <2.0 (*) 2.8 - 20.0 mg/dL  URINE RAPID DRUG SCREEN (HOSP PERFORMED)     Status: Abnormal   Collection Time    02/20/13  4:42 AM      Result Value Range   Opiates NONE DETECTED  NONE DETECTED   Cocaine POSITIVE (*) NONE DETECTED   Benzodiazepines NONE DETECTED  NONE DETECTED   Amphetamines NONE DETECTED  NONE DETECTED   Tetrahydrocannabinol NONE DETECTED  NONE DETECTED   Barbiturates NONE DETECTED  NONE DETECTED   Comment:            DRUG SCREEN FOR MEDICAL PURPOSES     ONLY.  IF CONFIRMATION IS NEEDED     FOR ANY PURPOSE, NOTIFY LAB     WITHIN 5 DAYS.                LOWEST DETECTABLE LIMITS     FOR URINE DRUG SCREEN     Drug Class       Cutoff (ng/mL)     Amphetamine      1000     Barbiturate      200     Benzodiazepine   200     Tricyclics       300     Opiates          300     Cocaine          300     THC              50    Physical Findings: AIMS: Facial and Oral Movements Muscles of Facial Expression: None, normal Lips and Perioral Area: None, normal Jaw: None, normal Tongue: None, normal,Extremity Movements Upper (arms, wrists, hands, fingers): None, normal Lower (legs, knees,  ankles, toes): None, normal, Trunk Movements Neck, shoulders, hips: None, normal, Overall Severity Severity of abnormal movements (highest score from questions above): None, normal  Incapacitation due to abnormal movements: None, normal Patient's awareness of abnormal movements (rate only patient's report): No Awareness,    CIWA:  CIWA-Ar Total: 1 COWS:     Treatment Plan Summary: Daily contact with patient to assess and evaluate symptoms and progress in treatment Medication management  Plan: Supportive approach/coping skills/relapse prevention           Continue to detox as needed            CBT;mindfulness, stress management  Medical Decision Making Problem Points:  Review of psycho-social stressors (1) Data Points:  Review of medication regiment & side effects (2)  I certify that inpatient services furnished can reasonably be expected to improve the patient's condition.   Conception Doebler A 02/21/2013, 2:30 PM

## 2013-02-22 MED ORDER — TRAZODONE HCL 50 MG PO TABS: 50.0000 mg | ORAL_TABLET | Freq: Every evening | ORAL | Status: DC | PRN

## 2013-02-22 NOTE — Progress Notes (Signed)
[facility] Adult Case Management Discharge Plan :  Will you be returning to the same living situation after discharge: Yes,  home with girlfriend At discharge, do you have transportation home?:Yes,  bus pass given to pt. Do you have the ability to pay for your medications:Yes,  mental health  Release of information consent forms completed and submitted to Medical Records by CSW.  Patient to Follow up at: Follow-up Information   Follow up with [facility]. (Walk in between 8am-9am Monday through Friday for hospital followup/medication management/assessment for therapy services. )    [address] information:   [address], [place] [address] Phone: [phone] Fax: [phone]      Patient denies SI/HI:   Yes, In group and self report.   Safety Planning and Suicide Prevention discussed:  Yes,  Yes,  Contact attempts made with pt's girlfriend. SPE completed with pt and he was provided with SPI pamphlet, encouraged to share with support network, ask questions, and talk about any concerns regarding SPE.  Smart, Connelly Spruell LCSWA 02/22/2013, 10:18 AM

## 2013-02-22 NOTE — [facility] Group Notes (Signed)
Adult Psychoeducational Group Note  Date:  02/22/2013 Time:  0900  Group Topic/Focus:  Orientation:   The focus of this group is to educate the patient on the purpose and policies of crisis stabilization and provide a format to answer questions about their admission.  The group details unit policies and expectations of patients while admitted.  Participation Level:  Active  Participation Quality:  Intrusive and Redirectable  Affect:  Excited  Cognitive:  Appropriate  Insight: Improving  Engagement in Group:  Distracting, Monopolizing and Off Topic  Modes of Intervention:  Confrontation, Discussion and Limit-setting  Additional Comments: Make it to my job interview today.  Jule Ser 02/22/2013, 9:28 AM
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
## 2013-02-22 NOTE — Progress Notes (Signed)
Pt was discharged home today. He denied any S/I H/I or A/V hallucinations.  He was given f/u appointment, rx, sample medications, hotline info booklet, and a bus pass.  He voiced understanding to all instructions provided.  He declined the need for smoking cessation materials.   
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
## 2013-02-24 ENCOUNTER — Emergency Department (HOSPITAL_COMMUNITY): Payer: Self-pay

## 2013-02-24 ENCOUNTER — Inpatient Hospital Stay (HOSPITAL_COMMUNITY)
Admission: EM | Admit: 2013-02-24 | Discharge: 2013-02-26 | DRG: 880 | Disposition: A | Discharge status: Other Acute Inpatient Hospital | Payer: Federal, State, Local not specified - Other | Attending: Internal Medicine | Admitting: Internal Medicine

## 2013-02-24 ENCOUNTER — Encounter (HOSPITAL_COMMUNITY): Payer: Self-pay | Admitting: Emergency Medicine

## 2013-02-24 DIAGNOSIS — F17200 Nicotine dependence, unspecified, uncomplicated: Secondary | ICD-10-CM | POA: Diagnosis present

## 2013-02-24 DIAGNOSIS — K219 Gastro-esophageal reflux disease without esophagitis: Secondary | ICD-10-CM | POA: Diagnosis present

## 2013-02-24 DIAGNOSIS — F1410 Cocaine abuse, uncomplicated: Secondary | ICD-10-CM

## 2013-02-24 DIAGNOSIS — F10129 Alcohol abuse with intoxication, unspecified: Secondary | ICD-10-CM | POA: Diagnosis present

## 2013-02-24 DIAGNOSIS — R69 Illness, unspecified: Secondary | ICD-10-CM

## 2013-02-24 DIAGNOSIS — F10929 Alcohol use, unspecified with intoxication, unspecified: Secondary | ICD-10-CM | POA: Diagnosis present

## 2013-02-24 DIAGNOSIS — E872 Acidosis, unspecified: Secondary | ICD-10-CM | POA: Diagnosis present

## 2013-02-24 DIAGNOSIS — F3189 Other bipolar disorder: Secondary | ICD-10-CM | POA: Diagnosis present

## 2013-02-24 DIAGNOSIS — IMO0002 Reserved for concepts with insufficient information to code with codable children: Secondary | ICD-10-CM

## 2013-02-24 DIAGNOSIS — Z5987 Material hardship due to limited financial resources, not elsewhere classified: Secondary | ICD-10-CM

## 2013-02-24 DIAGNOSIS — F22 Delusional disorders: Secondary | ICD-10-CM | POA: Diagnosis present

## 2013-02-24 DIAGNOSIS — F10229 Alcohol dependence with intoxication, unspecified: Secondary | ICD-10-CM | POA: Diagnosis present

## 2013-02-24 DIAGNOSIS — Z598 Other problems related to housing and economic circumstances: Secondary | ICD-10-CM

## 2013-02-24 DIAGNOSIS — R45851 Suicidal ideations: Principal | ICD-10-CM

## 2013-02-24 DIAGNOSIS — F1020 Alcohol dependence, uncomplicated: Secondary | ICD-10-CM

## 2013-02-24 DIAGNOSIS — Z79899 Other long term (current) drug therapy: Secondary | ICD-10-CM

## 2013-02-24 DIAGNOSIS — F1994 Other psychoactive substance use, unspecified with psychoactive substance-induced mood disorder: Secondary | ICD-10-CM

## 2013-02-24 DIAGNOSIS — M6282 Rhabdomyolysis: Secondary | ICD-10-CM | POA: Diagnosis present

## 2013-02-24 LAB — URINALYSIS, ROUTINE W REFLEX MICROSCOPIC
Bilirubin Urine: NEGATIVE
Hgb urine dipstick: NEGATIVE
Leukocytes, UA: NEGATIVE
Nitrite: NEGATIVE
Protein, ur: NEGATIVE mg/dL
Specific Gravity, Urine: 1.014 (ref 1.005–1.030)
Urobilinogen, UA: 0.2 mg/dL (ref 0.0–1.0)

## 2013-02-24 LAB — CBC
HCT: 40.9 % (ref 39.0–52.0)
Hemoglobin: 14.5 g/dL (ref 13.0–17.0)
MCH: 29.8 pg (ref 26.0–34.0)
MCHC: 35.5 g/dL (ref 30.0–36.0)
MCV: 84 fL (ref 78.0–100.0)
Platelets: 253 10*3/uL (ref 150–400)
RBC: 4.87 MIL/uL (ref 4.22–5.81)
RDW: 13.2 % (ref 11.5–15.5)
WBC: 11.6 10*3/uL — ABNORMAL HIGH (ref 4.0–10.5)

## 2013-02-24 LAB — COMPREHENSIVE METABOLIC PANEL
ALT: 19 U/L (ref 0–53)
AST: 30 U/L (ref 0–37)
Albumin: 3.7 g/dL (ref 3.5–5.2)
Alkaline Phosphatase: 69 U/L (ref 39–117)
BUN: 14 mg/dL (ref 6–23)
CO2: 16 mEq/L — ABNORMAL LOW (ref 19–32)
Calcium: 8.5 mg/dL (ref 8.4–10.5)
Chloride: 95 mEq/L — ABNORMAL LOW (ref 96–112)
Creatinine, Ser: 0.89 mg/dL (ref 0.50–1.35)
GFR calc Af Amer: >90 mL/min (ref >90)
GFR calc non Af Amer: >90 mL/min (ref >90)
Glucose, Bld: 69 mg/dL — ABNORMAL LOW (ref 70–99)
Potassium: 3.9 mEq/L (ref 3.5–5.1)
Sodium: 135 mEq/L (ref 135–145)
Total Bilirubin: 0.5 mg/dL (ref 0.3–1.2)
Total Protein: 7.2 g/dL (ref 6.0–8.3)

## 2013-02-24 LAB — RAPID URINE DRUG SCREEN, HOSP PERFORMED
Amphetamines: NOT DETECTED
Barbiturates: NOT DETECTED
Benzodiazepines: POSITIVE — AB
Cocaine: POSITIVE — AB
Opiates: NOT DETECTED
Tetrahydrocannabinol: NOT DETECTED

## 2013-02-24 LAB — SALICYLATE LEVEL: Salicylate Lvl: <2 mg/dL — ABNORMAL LOW (ref 2.8–20.0)

## 2013-02-24 LAB — ETHANOL: Alcohol, Ethyl (B): 183 mg/dL — ABNORMAL HIGH (ref 0–11)

## 2013-02-24 LAB — POCT I-STAT TROPONIN I: Troponin i, poc: 0.01 ng/mL (ref 0.00–0.08)

## 2013-02-24 LAB — BLOOD GAS, ARTERIAL
Bicarbonate: 22.7 mEq/L (ref 20.0–24.0)
O2 Saturation: 94.9 %
Patient temperature: 98.6
TCO2: 20.1 mmol/L (ref 0–100)
pCO2 arterial: 38.6 mmHg (ref 35.0–45.0)
pH, Arterial: 7.386 (ref 7.350–7.450)

## 2013-02-24 LAB — CK: Total CK: 718 U/L — ABNORMAL HIGH (ref 7–232)

## 2013-02-24 LAB — GLUCOSE, CAPILLARY: Glucose-Capillary: 74 mg/dL (ref 70–99)

## 2013-02-24 LAB — ACETAMINOPHEN LEVEL: Acetaminophen (Tylenol), Serum: <15 ug/mL (ref 10–30)

## 2013-02-24 MED ORDER — DEXTROSE-NACL 5-0.9 % IV SOLN
Freq: Once | INTRAVENOUS | Status: AC
  Administered 2013-02-24: 150 mL/h via INTRAVENOUS

## 2013-02-24 MED ORDER — SODIUM CHLORIDE 0.9 % IV BOLUS (SEPSIS)
1000.0000 mL | Freq: Once | INTRAVENOUS | Status: AC
  Administered 2013-02-24: 1000 mL via INTRAVENOUS
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
## 2013-02-24 NOTE — ED Notes (Signed)
Bed: WA15 Expected date:  Expected time:  Means of arrival:  Comments: EMS-OD 

## 2013-02-24 NOTE — ED Notes (Signed)
Patient transported to X-ray 

## 2013-02-24 NOTE — ED Notes (Signed)
Notified EDP,[place] pt. CG4 i-stat lactic acid 6.09.

## 2013-02-24 NOTE — ED Notes (Addendum)
Per EMS. Pt smoking cocaine, MDMA and etoh. Called out for chest pain. Pt was running out in front of traffic when EMS arrived in attempt to kill self. Pt told EMS that he was trying to take enough cocaine to "make heart explode." Pt agitated.
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
## 2013-02-25 DIAGNOSIS — F1010 Alcohol abuse, uncomplicated: Secondary | ICD-10-CM

## 2013-02-25 DIAGNOSIS — F3160 Bipolar disorder, current episode mixed, unspecified: Secondary | ICD-10-CM

## 2013-02-25 DIAGNOSIS — F1910 Other psychoactive substance abuse, uncomplicated: Secondary | ICD-10-CM

## 2013-02-25 LAB — BASIC METABOLIC PANEL
BUN: 14 mg/dL (ref 6–23)
Calcium: 8.5 mg/dL (ref 8.4–10.5)
GFR calc Af Amer: >90 mL/min (ref >90)
GFR calc non Af Amer: >90 mL/min (ref >90)
Glucose, Bld: 90 mg/dL (ref 70–99)
Sodium: 141 mEq/L (ref 135–145)

## 2013-02-25 LAB — OSMOLALITY: Osmolality: 311 mOsm/kg — ABNORMAL HIGH (ref 275–300)

## 2013-02-25 LAB — TROPONIN I
Troponin I: <0.3 ng/mL (ref <0.30)
Troponin I: <0.3 ng/mL (ref <0.30)

## 2013-02-25 MED ORDER — LORAZEPAM 1 MG PO TABS: 1.0000 mg | ORAL_TABLET | Freq: Four times a day (QID) | ORAL | Status: DC | PRN

## 2013-02-25 MED ORDER — THIAMINE HCL 100 MG/ML IJ SOLN
100.0000 mg | Freq: Every day | INTRAMUSCULAR | Status: DC
  Filled 2013-02-25 (×2): qty 1

## 2013-02-25 MED ORDER — INFLUENZA VAC SPLIT QUAD 0.5 ML IM SUSP
0.5000 mL | INTRAMUSCULAR | Status: DC
  Filled 2013-02-25 (×2): qty 0.5

## 2013-02-25 MED ORDER — HALOPERIDOL LACTATE 5 MG/ML IJ SOLN: 5.0000 mg | Freq: Three times a day (TID) | INTRAMUSCULAR | Status: DC | PRN

## 2013-02-25 MED ORDER — ARIPIPRAZOLE 5 MG PO TABS
5.0000 mg | ORAL_TABLET | Freq: Every day | ORAL | Status: DC
  Administered 2013-02-25 – 2013-02-26 (×2): 5 mg via ORAL
  Filled 2013-02-25 (×2): qty 1

## 2013-02-25 MED ORDER — VITAMIN B-1 100 MG PO TABS
100.0000 mg | ORAL_TABLET | Freq: Every day | ORAL | Status: DC
  Administered 2013-02-25 – 2013-02-26 (×2): 100 mg via ORAL
  Filled 2013-02-25 (×2): qty 1

## 2013-02-25 MED ORDER — LORAZEPAM 2 MG/ML IJ SOLN
0.0000 mg | Freq: Four times a day (QID) | INTRAMUSCULAR | Status: DC
  Administered 2013-02-25 (×2): 2 mg via INTRAVENOUS
  Administered 2013-02-26 (×2): 1 mg via INTRAVENOUS
  Filled 2013-02-25 (×4): qty 1

## 2013-02-25 MED ORDER — DEXTROSE-NACL 5-0.9 % IV SOLN
INTRAVENOUS | Status: DC
  Administered 2013-02-25 – 2013-02-26 (×3): via INTRAVENOUS

## 2013-02-25 MED ORDER — PNEUMOCOCCAL VAC POLYVALENT 25 MCG/0.5ML IJ INJ
0.5000 mL | INJECTION | INTRAMUSCULAR | Status: DC
  Filled 2013-02-25 (×2): qty 0.5

## 2013-02-25 MED ORDER — ONDANSETRON HCL 4 MG/2ML IJ SOLN: 4.0000 mg | Freq: Four times a day (QID) | INTRAMUSCULAR | Status: DC | PRN

## 2013-02-25 MED ORDER — ADULT MULTIVITAMIN W/MINERALS CH
1.0000 | ORAL_TABLET | Freq: Every day | ORAL | Status: DC
  Administered 2013-02-25 – 2013-02-26 (×2): 1 via ORAL
  Filled 2013-02-25 (×2): qty 1

## 2013-02-25 MED ORDER — ONDANSETRON HCL 4 MG PO TABS: 4.0000 mg | ORAL_TABLET | Freq: Four times a day (QID) | ORAL | Status: DC | PRN

## 2013-02-25 MED ORDER — SODIUM CHLORIDE 0.9 % IJ SOLN: 3.0000 mL | Freq: Two times a day (BID) | INTRAMUSCULAR | Status: DC

## 2013-02-25 MED ORDER — FOLIC ACID 1 MG PO TABS
1.0000 mg | ORAL_TABLET | Freq: Every day | ORAL | Status: DC
  Administered 2013-02-25 – 2013-02-26 (×2): 1 mg via ORAL
  Filled 2013-02-25 (×2): qty 1

## 2013-02-25 MED ORDER — LORAZEPAM 2 MG/ML IJ SOLN: 0.0000 mg | Freq: Two times a day (BID) | INTRAMUSCULAR | Status: DC

## 2013-02-25 MED ORDER — LORAZEPAM 2 MG/ML IJ SOLN: 1.0000 mg | Freq: Four times a day (QID) | INTRAMUSCULAR | Status: DC | PRN

## 2013-02-25 NOTE — Progress Notes (Signed)
Delay in getting pt up to floor due to need to find a sitter for bedside SI.  Notified of pt pending at 2315, I took report at 2330, talked with CN and AC on a plan to get bedside sitter able to obtain one and pt allowed to arrive to floor with sitter at 0055.

## 2013-02-25 NOTE — Progress Notes (Signed)
Pt admitted to room at 0055.  Agitated, repeating self and making many demands.  VSS and Telemetry is ST 108 initially.  Able to stand with one assist to go from stretcher to bed.   Demanding to eat. Is able to eat ice NAD.   Pt does say "I will just kill myself" 2 times during initial assessment.  Does not answer question ZO:XWRU of action.  Restless.  Takes much encouragement and 2mg  of ativan for pt to calm down and be able to listen.  Educated on Suicide precautions and sitter.  I called Clarisse Gouge, his girlfriend, per his request.  I have notified Elray Mcgregor NP, who is on call and his diet had been changed to heart healthy.  He is noted to eat a sandwich and drink fluids NAD.  Britta Mccreedy, sitter, is at bedside.  Rules and protocol for suicide sitting reinforced, room is per protocol.  Britta Mccreedy and pt deny any further questions.  Pt has apologized for behavior upon arrival to floor and is giving thanks for service given to him.   Pt noted to be sleeping at 0215.  His belongings are in pt belonging bags and have been locked up in RN station across from room 1431.

## 2013-02-25 NOTE — Progress Notes (Signed)
Utilization Review completed.  

## 2013-02-25 NOTE — Progress Notes (Addendum)
[facility] PROGRESS NOTE  Adam [name] ZOX:[id number] DOB: 01/03/1986 DOA: 02/24/2013 PCP: No PCP Per Patient  Assessment/Plan: . Alcohol intoxication/dependence &Cocaine abuse -continue ativan per detox protocol -psych consulted and Dr Lolly Mustache recommends inpatient psych .Suicidal ideations -pt seen by psych and recommends to continue sitter -started on abilify daily and prn haldol as per psych recs -he will need inpatient psych as above, per Psych pt will need to be involuntarily committed if he tries to leave . Substance induced mood disorder -abilify as above . Metabolic acidosis -resolved . Rhabdo -likely d/t cocaine, improving with hydration -troponins neg.    Code Status: FULL Family Communication: none Disposition Plan: to inpatient psych when bed available   Consultants:  PSYCH  Procedures:  none  Antibiotics:  none  HPI/Subjective: C/o about frequent blood draws, denies suicidal ideations. Denies cp  Objective: Filed Vitals:   02/25/13 0549  BP: 104/49  Pulse: 92  Temp: 98.1 F (36.7 C)  Resp: 16    Intake/Output Summary (Last 24 hours) at 02/25/13 1212 Last data filed at 02/25/13 0935  Gross per 24 hour  Intake   1710 ml  Output   1675 ml  Net     35 ml   Filed Weights   02/25/13 0055  Weight: 89.631 kg (197 lb 9.6 oz)    Exam:  General: alert & oriented x 3 In NAD, irritable Cardiovascular: RRR, nl S1 s2 Respiratory: CTAB Abdomen: soft +BS NT/ND, no masses palpable Extremities: No cyanosis and no edema    Data Reviewed: Basic Metabolic Panel:  Recent Labs Lab 02/20/13 0411 02/24/13 1945 02/25/13 0553  NA 141 135 141  K 3.7 3.9 4.3  CL 100 95* 108  CO2 26 16* 28  GLUCOSE 103* 69* 90  BUN 7 14 14   CREATININE 0.73 0.89 0.96  CALCIUM 8.9 8.5 8.5   Liver Function Tests:  Recent Labs Lab 02/20/13 0411 02/24/13 1945  AST 26 30  ALT 17 19  ALKPHOS 67 69  BILITOT 0.3 0.5  PROT 7.8 7.2  ALBUMIN 4.0 3.7   No  results found for this basename: LIPASE, AMYLASE,  in the last 168 hours No results found for this basename: AMMONIA,  in the last 168 hours CBC:  Recent Labs Lab 02/20/13 0411 02/24/13 1945  WBC 9.5 11.6*  NEUTROABS 6.1  --   HGB 16.1 14.5  HCT 44.2 40.9  MCV 83.1 84.0  PLT 242 253   Cardiac Enzymes:  Recent Labs Lab 02/24/13 1945 02/25/13 0002 02/25/13 0553 02/25/13 1119  CKTOTAL 718*  --   --   --   TROPONINI  --  <0.30 <0.30 <0.30   BNP (last 3 results) No results found for this basename: PROBNP,  in the last 8760 hours CBG:  Recent Labs Lab 02/24/13 1910  GLUCAP 74    No results found for this or any previous visit (from the past 240 hour(s)).   Studies: Ct Head Wo Contrast  02/24/2013   CLINICAL DATA:  General headache.  Cocaine abuse.  EXAM: CT HEAD WITHOUT CONTRAST  TECHNIQUE: Contiguous axial images were obtained from the base of the skull through the vertex without contrast.  COMPARISON:  06/08/2012.  FINDINGS: Normal appearance of the intracranial structures. No evidence for acute hemorrhage, mass lesion, midline shift, hydrocephalus or large infarct. No acute bony abnormality. The visualized sinuses are clear. No change from priors.  IMPRESSION: No acute intracranial abnormality.   Electronically Signed   By: Unice Bailey.D.  On: 02/24/2013 19:44   Dg Chest Portable 1 View  02/24/2013   CLINICAL DATA:  Cocaine and ETOH use.  Chest pain.  EXAM: PORTABLE CHEST - 1 VIEW  COMPARISON:  06/08/2012  FINDINGS: The heart size and mediastinal contours are within normal limits. Both lungs are clear. The visualized skeletal structures are unremarkable.  IMPRESSION: No active disease.   Electronically Signed   By: Davonna Belling M.D.   On: 02/24/2013 20:09    Scheduled Meds: . folic acid  1 mg Oral Daily  . LORazepam  0-4 mg Intravenous Q6H   Followed by  . [START ON 02/27/2013] LORazepam  0-4 mg Intravenous Q12H  . multivitamin with minerals  1 tablet Oral Daily   . sodium chloride  3 mL Intravenous Q12H  . thiamine  100 mg Oral Daily   Or  . thiamine  100 mg Intravenous Daily   Continuous Infusions: . dextrose 5 % and 0.9% NaCl 125 mL/hr at 02/25/13 1159    Active Problems:   Alcohol dependence   Cocaine abuse   Substance induced mood disorder   Suicidal ideation   Metabolic acidosis   Alcohol intoxication   Alcohol intoxication in active alcoholic    Time spent: 35    [facility]  [facility] Pager [phone]. If 7PM-7AM, please contact night-coverage at [email or web address], password [facility] 02/25/2013, 12:12 PM  LOS: 1 day
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
## 2013-02-26 ENCOUNTER — Inpatient Hospital Stay (HOSPITAL_COMMUNITY): Admission: AD | Admit: 2013-02-26 | Payer: Self-pay | Source: Intra-hospital | Admitting: Psychiatry

## 2013-02-26 ENCOUNTER — Inpatient Hospital Stay (HOSPITAL_COMMUNITY)
Admission: AD | Admit: 2013-02-26 | Discharge: 2013-02-27 | DRG: 897 | Disposition: A | Discharge status: Home / Self Care | Payer: Federal, State, Local not specified - Other | Source: Intra-hospital | Attending: Psychiatry | Admitting: Psychiatry

## 2013-02-26 ENCOUNTER — Encounter (HOSPITAL_COMMUNITY): Payer: Self-pay | Admitting: *Deleted

## 2013-02-26 DIAGNOSIS — F3289 Other specified depressive episodes: Secondary | ICD-10-CM | POA: Diagnosis present

## 2013-02-26 DIAGNOSIS — K219 Gastro-esophageal reflux disease without esophagitis: Secondary | ICD-10-CM | POA: Diagnosis present

## 2013-02-26 DIAGNOSIS — F1994 Other psychoactive substance use, unspecified with psychoactive substance-induced mood disorder: Secondary | ICD-10-CM | POA: Diagnosis present

## 2013-02-26 DIAGNOSIS — F1020 Alcohol dependence, uncomplicated: Principal | ICD-10-CM | POA: Diagnosis present

## 2013-02-26 DIAGNOSIS — F1410 Cocaine abuse, uncomplicated: Secondary | ICD-10-CM

## 2013-02-26 DIAGNOSIS — F17200 Nicotine dependence, unspecified, uncomplicated: Secondary | ICD-10-CM | POA: Diagnosis present

## 2013-02-26 DIAGNOSIS — F1420 Cocaine dependence, uncomplicated: Secondary | ICD-10-CM | POA: Diagnosis present

## 2013-02-26 DIAGNOSIS — F329 Major depressive disorder, single episode, unspecified: Secondary | ICD-10-CM | POA: Diagnosis present

## 2013-02-26 DIAGNOSIS — R45851 Suicidal ideations: Secondary | ICD-10-CM

## 2013-02-26 DIAGNOSIS — F411 Generalized anxiety disorder: Secondary | ICD-10-CM | POA: Diagnosis present

## 2013-02-26 HISTORY — DX: Anxiety disorder, unspecified: F41.9

## 2013-02-26 LAB — CK: Total CK: 258 U/L — ABNORMAL HIGH (ref 7–232)

## 2013-02-26 MED ORDER — TRAZODONE HCL 50 MG PO TABS: 50.0000 mg | ORAL_TABLET | Freq: Every evening | ORAL | Status: DC | PRN

## 2013-02-26 MED ORDER — VITAMIN B-1 100 MG PO TABS
100.0000 mg | ORAL_TABLET | Freq: Every day | ORAL | Status: DC
Start: 2013-02-27 — End: 2013-02-27
  Administered 2013-02-27: 08:00:00 100 mg via ORAL
  Filled 2013-02-26 (×2): qty 1

## 2013-02-26 MED ORDER — THIAMINE HCL 100 MG/ML IJ SOLN
100.0000 mg | Freq: Once | INTRAMUSCULAR | Status: DC
  Filled 2013-02-26: qty 2

## 2013-02-26 MED ORDER — THIAMINE HCL 100 MG PO TABS: 100.0000 mg | ORAL_TABLET | Freq: Every day | ORAL | Status: DC

## 2013-02-26 MED ORDER — CHLORDIAZEPOXIDE HCL 25 MG PO CAPS: 25.0000 mg | ORAL_CAPSULE | Freq: Three times a day (TID) | ORAL | Status: DC

## 2013-02-26 MED ORDER — CHLORDIAZEPOXIDE HCL 25 MG PO CAPS: 25.0000 mg | ORAL_CAPSULE | Freq: Every day | ORAL | Status: DC

## 2013-02-26 MED ORDER — IBUPROFEN 800 MG PO TABS
800.0000 mg | ORAL_TABLET | Freq: Once | ORAL | Status: AC
  Administered 2013-02-26: 800 mg via ORAL
  Filled 2013-02-26: qty 1

## 2013-02-26 MED ORDER — HALOPERIDOL LACTATE 5 MG/ML IJ SOLN: 5.0000 mg | Freq: Three times a day (TID) | INTRAMUSCULAR | Status: DC | PRN

## 2013-02-26 MED ORDER — MAGNESIUM HYDROXIDE 400 MG/5ML PO SUSP: 30.0000 mL | Freq: Every day | ORAL | Status: DC | PRN

## 2013-02-26 MED ORDER — ARIPIPRAZOLE 5 MG PO TABS: 5.0000 mg | ORAL_TABLET | Freq: Every day | ORAL | Status: DC

## 2013-02-26 MED ORDER — LORAZEPAM 1 MG PO TABS: 1.0000 mg | ORAL_TABLET | Freq: Four times a day (QID) | ORAL | Status: DC | PRN

## 2013-02-26 MED ORDER — HYDROXYZINE HCL 25 MG PO TABS: 25.0000 mg | ORAL_TABLET | Freq: Four times a day (QID) | ORAL | Status: DC | PRN

## 2013-02-26 MED ORDER — CHLORDIAZEPOXIDE HCL 25 MG PO CAPS
25.0000 mg | ORAL_CAPSULE | Freq: Once | ORAL | Status: DC
  Filled 2013-02-26: qty 1

## 2013-02-26 MED ORDER — LOPERAMIDE HCL 2 MG PO CAPS: 2.0000 mg | ORAL_CAPSULE | ORAL | Status: DC | PRN

## 2013-02-26 MED ORDER — ONDANSETRON 4 MG PO TBDP: 4.0000 mg | ORAL_TABLET | Freq: Four times a day (QID) | ORAL | Status: DC | PRN

## 2013-02-26 MED ORDER — ADULT MULTIVITAMIN W/MINERALS CH: 1.0000 | ORAL_TABLET | Freq: Every day | ORAL | Status: DC

## 2013-02-26 MED ORDER — NICOTINE 21 MG/24HR TD PT24
21.0000 mg | MEDICATED_PATCH | Freq: Every day | TRANSDERMAL | Status: DC
  Filled 2013-02-26 (×2): qty 1

## 2013-02-26 MED ORDER — ACETAMINOPHEN 325 MG PO TABS: 650.0000 mg | ORAL_TABLET | Freq: Four times a day (QID) | ORAL | Status: DC | PRN

## 2013-02-26 MED ORDER — ALUM & MAG HYDROXIDE-SIMETH 200-200-20 MG/5ML PO SUSP: 30.0000 mL | ORAL | Status: DC | PRN

## 2013-02-26 MED ORDER — CHLORDIAZEPOXIDE HCL 25 MG PO CAPS: 25.0000 mg | ORAL_CAPSULE | ORAL | Status: DC

## 2013-02-26 MED ORDER — CHLORDIAZEPOXIDE HCL 25 MG PO CAPS
25.0000 mg | ORAL_CAPSULE | Freq: Four times a day (QID) | ORAL | Status: DC
  Administered 2013-02-26 – 2013-02-27 (×2): 25 mg via ORAL
  Filled 2013-02-26: qty 1

## 2013-02-26 MED ORDER — CHLORDIAZEPOXIDE HCL 25 MG PO CAPS: 25.0000 mg | ORAL_CAPSULE | Freq: Four times a day (QID) | ORAL | Status: DC | PRN

## 2013-02-26 MED ORDER — ADULT MULTIVITAMIN W/MINERALS CH
1.0000 | ORAL_TABLET | Freq: Every day | ORAL | Status: DC
  Administered 2013-02-26 – 2013-02-27 (×2): 1 via ORAL
  Filled 2013-02-26 (×4): qty 1

## 2013-02-26 NOTE — Progress Notes (Signed)
Clinical Social Work  Patient accepted to [facility]. RN has called report. CSW informed patient of plans. Patient upset and feels he does not need to return to [facility]. CSW coordinated transportation via [employer]. CSW is signing off but available if further needs arise.  [place], [place] [phone]

## 2013-02-26 NOTE — Progress Notes (Signed)
Patient has been medically discharged and will be transported to [facility]. Report called to Lupita Leash, RN.  Belongings will be sent with patient and transport team. Darien Ramus, RN
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
## 2013-02-26 NOTE — Progress Notes (Signed)
Pt reports he just arrived on the unit and is upset that he is here.  He said he was at the ED for 3 days and is already detoxed.  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  He denies SI/HI/AV.  He said he is not having any withdrawal symptoms.  He is worried about his mother who is in a facility d/t an overdose.  He feels it is not doing him any good to be here, especially that he is not suicidal.  Clinical research associate offered support and encouragement.  Encouraged pt to discuss his concerns with the MD in the AM.  Pt was just here at [facility] last week.  Pt makes his needs known to staff.  Safety maintained with q15 minute checks.

## 2013-02-26 NOTE — Progress Notes (Signed)
Clinical Social Work Department CLINICAL SOCIAL WORK PSYCHIATRY SERVICE LINE ASSESSMENT 02/26/2013  Patient:  Adam [name]  Account:  [id number]  Admit Date:  02/24/2013  Clinical Social Worker:  Unk Lightning, LCSW  Date/Time:  02/26/2013 10:20 AM Referred by:  Physician  Date referred:  02/26/2013 Reason for Referral  Psychosocial assessment   Presenting Symptoms/Problems (In the person's/family's own words):   Psych consulted due to SI and substance use.   Abuse/Neglect/Trauma History (check all that apply)  Denies history   Abuse/Neglect/Trauma Comments:   Psychiatric History (check all that apply)  Outpatient treatment  Inpatient/hospitilization  Residential treatment   Psychiatric medications:  Abilify 5 mg  Ativan 1 mg   Current Mental Health Hospitalizations/Previous Mental Health History:   Patient reports that he has been diagnosed with bipolar disorder. Patient has been using alcohol and drugs for several years and reports inpatient and residential treatment as well.   Current provider:   Vickii Penna and Date:   [place], [place]   Current Medications:   Scheduled Meds:      . ARIPiprazole  5 mg Oral Daily  . folic acid  1 mg Oral Daily  . influenza vac split quadrivalent PF  0.5 mL Intramuscular Tomorrow-1000  . LORazepam  0-4 mg Intravenous Q6H   Followed by     . [START ON 02/27/2013] LORazepam  0-4 mg Intravenous Q12H  . multivitamin with minerals  1 tablet Oral Daily  . pneumococcal 23 valent vaccine  0.5 mL Intramuscular Tomorrow-1000  . sodium chloride  3 mL Intravenous Q12H  . thiamine  100 mg Oral Daily   Or     . thiamine  100 mg Intravenous Daily        Continuous Infusions:      . dextrose 5 % and 0.9% NaCl 125 mL/hr at 02/26/13 0415          PRN Meds:.haloperidol lactate, LORazepam, LORazepam, ondansetron (ZOFRAN) IV, ondansetron       Previous Impatient Admission/Date/Reason:   Patient recently DC from [facility] and [name]. Patient states he  has spent 2 years at inpatient SA treatment facility as well.   Emotional Health / Current Symptoms    Suicide/Self Harm  Suicide attempt in past (date/description)  Suicidal ideation (ex: "I can't take any more,I wish I could disappear")   Suicide attempt in the past:   Patient reports he has attempted to harm himself in the past but would not elaborate on any further details. Patient reports he did endorse SI on admission but no current SI.   Other harmful behavior:   Patient is chronic substance user.   Psychotic/Dissociative Symptoms  None reported   Other Psychotic/Dissociative Symptoms:   N/A    Attention/Behavioral Symptoms  Restless   Other Attention / Behavioral Symptoms:   Patient aggravated that he is in the hospital and wants to leave. CSW explained patient is under IVC and psych MD recommending inpatient treatment.    Cognitive Impairment  Within Normal Limits   Other Cognitive Impairment:   Patient alert and oriented.    Mood and Adjustment  Labile    Stress, Anxiety, Trauma, Any Recent Loss/Stressor  None reported   Anxiety (frequency):   N/A   Phobia (specify):   N/A   Compulsive behavior (specify):   N/A   Obsessive behavior (specify):   N/A   Other:   N/A   Substance Abuse/Use  Current substance use   SBIRT completed (please refer for detailed history):  N  Self-reported substance use:   Patient refused to complete SBIRT. Patient reports that he uses alcohol and smokes crack but is going to stop. Patient reports he has been to treatment and does not need any further treatment but just wants to DC.   Urinary Drug Screen Completed:  Y Alcohol level:   183    Environmental/Housing/Living Arrangement  Stable housing   Who is in the home:   Alone   Emergency contact:  [place]-girlfriend   Financial  IPRS   Patient's Strengths and Goals (patient's own words):   Patient reports that he has job interviews and is interested  in making his life better. Patient reports he is motivated to remain sober.   Clinical Social Worker's Interpretive Summary:   CSW received referral to complete psychosocial assessment. CSW reviewed chart and met with patient at bedside. CSW introduced myself and explained role.    Patient reports he was drinking and using drugs at home and got upset with girlfriend. Patient states that he called 911 and does admit to making statements about harming himself. Patient reports he no longer feels suicidal and would like to DC today. Patient states he has job interviews and wants to remain sober so that he may work.    Patient reports he has been using drugs and alcohol for several years. Patient refuses to complete SBIRT and denies that any treatment is needed. Patient reports that he plans on getting a job and that will be enough motivation to remain sober.    CSW and patient discussed previous treatment and psych MD recommendations for inpatient treatment at this time. Patient reports that he has been to [facility], [name] and other SA treatment facilities in the past and they are not beneficial. Patient reports he will not go to another facility. CSW explained that MD placed patient under involuntary commitment and he would have to comply with MD recommendations.    CSW called to confirm that Magistrate received paperwork and that patient will be served. CSW explained to RN that MD has signed IVC forms which are valid for up to 24 hours until [employer] can serve patient.    Patient irritable during assessment and upset with psych MD recommendations. Patient minimizes affects of substance use and despite CSW encouragement, patient refusing any SA treatment. Patient aware of plans but is not happy that he is under IVC and does not agree with psych MD recommendations.    CSW will work on psych placement.   Disposition:  Inpatient referral made [facility], [facility], Geri-psych)  [place], [place] [phone]

## 2013-02-26 NOTE — Treatment Plan (Signed)
Pt accepted back to [facility] by Dr. Dub Mikes to room 305-1.

## 2013-02-26 NOTE — Progress Notes (Signed)
Clinical Social Work  Patient accepted to [facility] 305-1 by Dr. Dub Mikes. RN to call report to [phone]. MD aware of DC plans. CSW will DC patient via [employer] once DC orders are placed.  [place], [place] [phone]
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
## 2013-02-26 NOTE — Tx Team (Signed)
Initial Interdisciplinary Treatment Plan  PATIENT STRENGTHS: (choose at least two) Ability for insight Average or above average intelligence Capable of independent living Communication skills General fund of knowledge Motivation for treatment/growth Physical Health Supportive family/friends Work skills  PATIENT STRESSORS: Financial difficulties Medication change or noncompliance Occupational concerns Substance abuse   PROBLEM LIST: Problem List/Patient Goals Date to be addressed Date deferred Reason deferred Estimated date of resolution  Substance abuse 02/26/2013   D/c        Suicidal ideation 02/26/2014   D/c        depression 02/26/2013   D/c                           DISCHARGE CRITERIA:  Ability to meet basic life and health needs Adequate post-discharge living arrangements Improved stabilization in mood, thinking, and/or behavior Medical problems require only outpatient monitoring Motivation to continue treatment in a less acute level of care Need for constant or close observation no longer present Reduction of life-threatening or endangering symptoms to within safe limits Safe-care adequate arrangements made Verbal commitment to aftercare and medication compliance Withdrawal symptoms are absent or subacute and managed without 24-hour nursing intervention  PRELIMINARY DISCHARGE PLAN: Attend aftercare/continuing care group Attend PHP/IOP Attend 12-step recovery group Outpatient therapy Return to previous living arrangement  PATIENT/FAMIILY INVOLVEMENT: This treatment plan has been presented to and reviewed with the patient, Adam [name].  The patient and family have been given the opportunity to ask questions and make suggestions.  [name] 02/26/2013, 5:55 PM

## 2013-02-26 NOTE — Progress Notes (Signed)
Gave [employer] patients belongings and paperwork. Patient very agitated and asking about his wallet. RN examined the two bags of personal belongings and did not locate a wallet.  There was no security slip or documentation of a wallet. I did communicate with [facility] security of the situation and if one was logged in they would call me at my extension.  [facility] escorting patient to [facility], with no further needs.

## 2013-02-27 DIAGNOSIS — F1420 Cocaine dependence, uncomplicated: Secondary | ICD-10-CM

## 2013-02-27 DIAGNOSIS — F1020 Alcohol dependence, uncomplicated: Principal | ICD-10-CM

## 2013-02-27 DIAGNOSIS — F1994 Other psychoactive substance use, unspecified with psychoactive substance-induced mood disorder: Secondary | ICD-10-CM

## 2013-02-27 MED ORDER — TRAZODONE HCL 50 MG PO TABS: 50.0000 mg | ORAL_TABLET | Freq: Every evening | ORAL | Status: DC | PRN

## 2013-02-27 NOTE — [facility] Suicide Risk Assessment (Signed)
[facility] INPATIENT:  Family/Significant Other Suicide Prevention Education  Suicide Prevention Education:  Patient Refusal for Family/Significant Other Suicide Prevention Education: The patient Adam [name] has refused to provide written consent for family/significant other to be provided Family/Significant Other Suicide Prevention Education during admission and/or prior to discharge.  Physician notified.   SPE completed with pt. Pt provided with SPI pamphlet and encouraged to share information with his support network, ask questions, and talk about any concerns.   Smart, HeatherLCSWA  02/27/2013, 10:55 AM
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
## 2013-02-27 NOTE — Progress Notes (Signed)
Discharge Note:  Patient discharged to his home with bus tickets.  Denied SI and HI.  Denied A/V hallucinations.  Denied pain.  Suicide prevention information given and discussed with patient who stated he understood and had no questions.  Patient received all his belongings, coat, shoes, strings, clothing, cards, billfold, license, ear phone, matches, pen.  Patient stated he appreciated all assistance received from [facility] staff, especially Maury Dus and Dr Dub Mikes.  Patient is looking forward to going to his 2 job interviews.

## 2013-02-27 NOTE — Progress Notes (Signed)
Patient ID: Adam [name], male   DOB: 01-Nov-1985, 27 y.o.   MRN: [id number] D:  Patient's self inventory sheet, patient slept well, good appetite, normal energy level, good attention span.  Denied depression, anxiety, hopeless.  Denied withdrawals.  Denied physical problems.  "I need to leave today to go to job interview, go to [employer], and go to check on daddy and Mommy.  Me being here is a big misunderstanding and I need to leave immediately to go to my appointment to take care of my family.  I was in [facility] for detox for 3 days and I have been fine and told doc.  I tried to blow my heart smoking crack; but I didn't, I was drunk, did smoke crack/drink, but wasn't trying to hurt myself or anyone else, need to leave now." A:  Medications administered per MD orders.  Emotional support and encouragement given patient. R:  Denied SI and HI.  Denied A/V hallucinations.  Denied pain.  Will continue to monitor patient for safety with 15 minute checks.  Safety maintained.

## 2013-02-27 NOTE — Progress Notes (Signed)
The focus of this group is to educate the patient on the purpose and policies of crisis stabilization and provide a format to answer questions about their admission.  The group details unit policies and expectations of patients while admitted.  Patient attended 0900 nurse education orientation group this morning.  Patient actively participated, appropriate affect, alert, appropriate insight and engagement.  Today patient will work on 3 goals for discharge.  

## 2013-02-27 NOTE — Progress Notes (Signed)
[facility] Adult Case Management Discharge Plan :  Will you be returning to the same living situation after discharge: No.Going to live with his father. At discharge, do you have transportation home?:Yes,  bus pass provided to pt.  Do you have the ability to pay for your medications:Yes,  mental health  Release of information consent forms completed and submitted to Medical Records by CSW. Patient to Follow up at: Follow-up Information   Follow up with [facility]. (Walk in between 8am-9am Monday through Friday for hospital followup/medication management/assessment for therapy services. )    Contact information:   [address], [place] [address] Phone: [phone] Fax: [phone]      Patient denies SI/HI:   Yes,  during self report.    Safety Planning and Suicide Prevention discussed:  Yes,  SPI completed with pt as he did not consent to family contact. SPI pamphlet provided to pt and he was encouraged to share information with support network, ask questions, and talk about any concerns.   Smart, HeatherLCSWA  02/27/2013, 10:59 AM
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
## 2013-02-27 NOTE — Progress Notes (Signed)
Patient Discharge Instructions:  After Visit Summary (AVS):   Faxed to:  02/27/13 Discharge Summary Note:   Faxed to:  02/27/13 Psychiatric Admission Assessment Note:   Faxed to:  02/27/13 Suicide Risk Assessment - Discharge Assessment:   Faxed to:  02/27/13 Faxed/Sent to the Next Level Care provider:  02/27/13 Faxed to [facility] @ [phone]  Jerelene Redden, 02/27/2013, 4:20 PM

## 2013-03-02 NOTE — Progress Notes (Signed)
Patient Discharge Instructions:  After Visit Summary (AVS):   Faxed to:  03/02/13 Discharge Summary Note:   Faxed to:  03/02/13 Psychiatric Admission Assessment Note:   Faxed to:  03/02/13 Suicide Risk Assessment - Discharge Assessment:   Faxed to:  03/02/13 Faxed/Sent to the Next Level Care provider:  03/02/13 Faxed to [facility] @ [phone]  Jerelene Redden, 03/02/2013, 3:13 PM

## 2013-03-04 ENCOUNTER — Emergency Department (HOSPITAL_COMMUNITY)
Admission: EM | Admit: 2013-03-04 | Discharge: 2013-03-05 | Disposition: A | Discharge status: Home / Self Care | Payer: Self-pay | Attending: Emergency Medicine | Admitting: Emergency Medicine

## 2013-03-04 ENCOUNTER — Encounter (HOSPITAL_COMMUNITY): Payer: Self-pay | Admitting: Emergency Medicine

## 2013-03-04 DIAGNOSIS — F17200 Nicotine dependence, unspecified, uncomplicated: Secondary | ICD-10-CM | POA: Insufficient documentation

## 2013-03-04 DIAGNOSIS — F1020 Alcohol dependence, uncomplicated: Secondary | ICD-10-CM

## 2013-03-04 DIAGNOSIS — F1910 Other psychoactive substance abuse, uncomplicated: Secondary | ICD-10-CM

## 2013-03-04 DIAGNOSIS — F489 Nonpsychotic mental disorder, unspecified: Secondary | ICD-10-CM | POA: Insufficient documentation

## 2013-03-04 DIAGNOSIS — F1994 Other psychoactive substance use, unspecified with psychoactive substance-induced mood disorder: Secondary | ICD-10-CM

## 2013-03-04 DIAGNOSIS — F3289 Other specified depressive episodes: Secondary | ICD-10-CM | POA: Insufficient documentation

## 2013-03-04 DIAGNOSIS — M795 Residual foreign body in soft tissue: Secondary | ICD-10-CM | POA: Insufficient documentation

## 2013-03-04 DIAGNOSIS — F329 Major depressive disorder, single episode, unspecified: Secondary | ICD-10-CM | POA: Insufficient documentation

## 2013-03-04 DIAGNOSIS — Z8719 Personal history of other diseases of the digestive system: Secondary | ICD-10-CM | POA: Insufficient documentation

## 2013-03-04 DIAGNOSIS — F1010 Alcohol abuse, uncomplicated: Secondary | ICD-10-CM | POA: Insufficient documentation

## 2013-03-04 DIAGNOSIS — R011 Cardiac murmur, unspecified: Secondary | ICD-10-CM | POA: Insufficient documentation

## 2013-03-04 DIAGNOSIS — Z79899 Other long term (current) drug therapy: Secondary | ICD-10-CM | POA: Insufficient documentation

## 2013-03-04 DIAGNOSIS — F411 Generalized anxiety disorder: Secondary | ICD-10-CM | POA: Insufficient documentation

## 2013-03-04 DIAGNOSIS — R45851 Suicidal ideations: Secondary | ICD-10-CM

## 2013-03-04 DIAGNOSIS — Z87828 Personal history of other (healed) physical injury and trauma: Secondary | ICD-10-CM | POA: Insufficient documentation

## 2013-03-04 DIAGNOSIS — F911 Conduct disorder, childhood-onset type: Secondary | ICD-10-CM | POA: Insufficient documentation

## 2013-03-04 DIAGNOSIS — F1410 Cocaine abuse, uncomplicated: Secondary | ICD-10-CM

## 2013-03-04 LAB — CBC
HCT: 42.5 % (ref 39.0–52.0)
MCHC: 36.2 g/dL — ABNORMAL HIGH (ref 30.0–36.0)
MCV: 84.2 fL (ref 78.0–100.0)
RBC: 5.05 MIL/uL (ref 4.22–5.81)
RDW: 13.7 % (ref 11.5–15.5)

## 2013-03-04 LAB — COMPREHENSIVE METABOLIC PANEL
Albumin: 3.9 g/dL (ref 3.5–5.2)
BUN: 8 mg/dL (ref 6–23)
CO2: 26 mEq/L (ref 19–32)
Calcium: 9.1 mg/dL (ref 8.4–10.5)
Creatinine, Ser: 0.86 mg/dL (ref 0.50–1.35)
Potassium: 3.9 mEq/L (ref 3.5–5.1)
Total Protein: 7.5 g/dL (ref 6.0–8.3)

## 2013-03-04 LAB — ETHANOL: Alcohol, Ethyl (B): 102 mg/dL — ABNORMAL HIGH (ref 0–11)

## 2013-03-04 LAB — RAPID URINE DRUG SCREEN, HOSP PERFORMED
Benzodiazepines: POSITIVE — AB
Cocaine: POSITIVE — AB
Opiates: NOT DETECTED

## 2013-03-04 LAB — SALICYLATE LEVEL: Salicylate Lvl: <2 mg/dL — ABNORMAL LOW (ref 2.8–20.0)

## 2013-03-04 LAB — ACETAMINOPHEN LEVEL: Acetaminophen (Tylenol), Serum: <15 ug/mL (ref 10–30)

## 2013-03-04 MED ORDER — ONDANSETRON HCL 4 MG PO TABS: 4.0000 mg | ORAL_TABLET | Freq: Three times a day (TID) | ORAL | Status: DC | PRN

## 2013-03-04 MED ORDER — IBUPROFEN 200 MG PO TABS: 600.0000 mg | ORAL_TABLET | Freq: Three times a day (TID) | ORAL | Status: DC | PRN

## 2013-03-04 MED ORDER — THIAMINE HCL 100 MG/ML IJ SOLN
100.0000 mg | Freq: Once | INTRAMUSCULAR | Status: DC
  Filled 2013-03-04: qty 2

## 2013-03-04 MED ORDER — CHLORDIAZEPOXIDE HCL 25 MG PO CAPS: 25.0000 mg | ORAL_CAPSULE | Freq: Four times a day (QID) | ORAL | Status: DC | PRN

## 2013-03-04 MED ORDER — CHLORDIAZEPOXIDE HCL 25 MG PO CAPS
25.0000 mg | ORAL_CAPSULE | Freq: Four times a day (QID) | ORAL | Status: AC
  Administered 2013-03-04 – 2013-03-05 (×4): 25 mg via ORAL
  Filled 2013-03-04 (×4): qty 1

## 2013-03-04 MED ORDER — TRAZODONE HCL 50 MG PO TABS
50.0000 mg | ORAL_TABLET | Freq: Every evening | ORAL | Status: DC | PRN
  Administered 2013-03-04: 50 mg via ORAL
  Filled 2013-03-04: qty 1

## 2013-03-04 MED ORDER — VITAMIN B-1 100 MG PO TABS
100.0000 mg | ORAL_TABLET | Freq: Every day | ORAL | Status: DC
  Administered 2013-03-05: 09:00:00 100 mg via ORAL
  Filled 2013-03-04: qty 1

## 2013-03-04 MED ORDER — ALUM & MAG HYDROXIDE-SIMETH 200-200-20 MG/5ML PO SUSP: 30.0000 mL | ORAL | Status: DC | PRN

## 2013-03-04 MED ORDER — CHLORDIAZEPOXIDE HCL 25 MG PO CAPS: 25.0000 mg | ORAL_CAPSULE | Freq: Three times a day (TID) | ORAL | Status: DC

## 2013-03-04 MED ORDER — CHLORDIAZEPOXIDE HCL 25 MG PO CAPS: 25.0000 mg | ORAL_CAPSULE | Freq: Every day | ORAL | Status: DC

## 2013-03-04 MED ORDER — ADULT MULTIVITAMIN W/MINERALS CH
1.0000 | ORAL_TABLET | Freq: Every day | ORAL | Status: DC
  Administered 2013-03-04 – 2013-03-05 (×2): 1 via ORAL
  Filled 2013-03-04 (×2): qty 1

## 2013-03-04 MED ORDER — HYDROXYZINE HCL 25 MG PO TABS: 25.0000 mg | ORAL_TABLET | Freq: Four times a day (QID) | ORAL | Status: DC | PRN

## 2013-03-04 MED ORDER — CHLORDIAZEPOXIDE HCL 25 MG PO CAPS
25.0000 mg | ORAL_CAPSULE | Freq: Once | ORAL | Status: AC
  Administered 2013-03-04: 25 mg via ORAL
  Filled 2013-03-04: qty 1

## 2013-03-04 MED ORDER — CHLORDIAZEPOXIDE HCL 25 MG PO CAPS: 25.0000 mg | ORAL_CAPSULE | ORAL | Status: DC

## 2013-03-04 MED ORDER — ACETAMINOPHEN 325 MG PO TABS: 650.0000 mg | ORAL_TABLET | ORAL | Status: DC | PRN

## 2013-03-04 MED ORDER — LOPERAMIDE HCL 2 MG PO CAPS: 2.0000 mg | ORAL_CAPSULE | ORAL | Status: DC | PRN

## 2013-03-04 MED ORDER — ONDANSETRON 4 MG PO TBDP: 4.0000 mg | ORAL_TABLET | Freq: Four times a day (QID) | ORAL | Status: DC | PRN

## 2013-03-04 NOTE — ED Notes (Signed)
Asking to have a shower

## 2013-03-04 NOTE — ED Notes (Signed)
Per night shift RN pt was wanded by security and is awaiting to be moved to psych ed. Pt is asleep, easily arousable, polite, he does endorse suicidal ideation, denies plan.

## 2013-03-04 NOTE — ED Notes (Addendum)
Pt has 2 patient belongings bags in locker 41. Bags contain 1 pair of white athletic shoes, pants-blue jeans, boxers underwear, 1 plaid long sleeves shirt, 1 white t-shirt.
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
## 2013-03-04 NOTE — ED Notes (Signed)
Patient is alert and oriented x3.  He is complaining of SI along with ETOH on board. Patient also states that he some +$100 of crack cocaine.  Patient denies any pain
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
## 2013-03-05 NOTE — Progress Notes (Signed)
Clinical Social Work  CSW received a call from TTS regarding patient needing assistance with transportation. CSW met with patient at bedside and patient reports he needs a taxi voucher. CSW inquired about where patient lived and his ability to ambulate. Patient ambulates independently and usually rides the bus. CSW agreeable to provide bus pass for DC. Patient also requests a PART bus pass. CSW explained not PART passes. CSW provided RN with bus passes to give to patient at DC.  Unk Lightning, LCSW  (Coverage for [employer])

## 2013-03-05 NOTE — Progress Notes (Signed)
Patient interviewed, examined by me. Patient has a long history of substance abuse and needs long-term substance abuse care. Patient willing for outpatient care,denies any suicidal ideation, any homicidal ideations, any psychotic symptoms

## 2013-03-05 NOTE — Progress Notes (Signed)
Adam [name], MHT requested by Adam [name], TTS to seek detox placement for patient. Writer reviewed patients assessment discovered he reports SI without plan or intent. Referral to R[facility] will not be appropriate due to SI without plan and has Medicaid which excludes referral to [facility] for detox. Writer will seek other local facilities with detox capability.  [facility] faxed referral for review Adam [name] no adult MCD [name] faxed for review

## 2013-03-05 NOTE — ED Provider Notes (Signed)
Psych team has evaluated pt and feel he appropriate for discharge with outpt resources. EDP was requested to d/c pt. Handouts given.  Laray Anger, DO 03/05/13 1506

## 2013-03-05 NOTE — [facility] Counselor (Signed)
Writer provided pt w/ following resources based in [employer] . CSW will arrange transportation voucher to get pt back to [facility]. Pt denies SI and HI. He denies [facility] and no delusions noted. Pt sts his mother is sick and he needs to get back and see her.  Behavioral [facility] Center  Intensive Outpatient Programs [facility]    [facility] [address]     [address] [place],  [place]     [place], [place] [phone]      [phone] Both a day and evening program   *Accepts MCD  [facility]  [facility] [address]     [address] [place], [place] [address] [phone]      [phone]  [facility]: [facility]    [facility] - Intensive Outpatient [address]     [address] [place], [place] [address]     [place], [place]  [phone] *Accepts MCD     [phone]  Residential Treatment Programs ASAP Residential Treatment    [facility] [address]     [address] [place], [place] [phone]      [phone] or [phone]  [facility]     The [address] (Several in [place]) [address], [place] [address]    [address] [place] [address]     [place], [place] [phone]      [phone]  [facility]   [facility] ([facility]) [address]     [address] [place], [place] [address]     [place], [place] [phone]      [phone] Admissions: 8am-3pm M-F  Self-Help/Support Groups [facility]   Narcotics Anonymous (NA) Variety of support groups    [facility] [phone] (call for more info)    [address]        [place] - 2 meetings at this location   [employer], [place] Assessment Counselor

## 2013-03-05 NOTE — Progress Notes (Signed)
Patient ID: Adam [name], male   DOB: May 11, 1985, 27 y.o.   MRN: [id number] Psychiatric Specialty Exam: Physical Exam  ROS  Blood pressure 136/88, pulse 84, temperature 97.8 F (36.6 C), temperature source Oral, resp. rate 18, SpO2 97.00%.There is no weight on file to calculate BMI.  General Appearance: Casual  Eye Contact::  None  Speech:  Clear and Coherent  Volume:  Normal  Mood:  Angry, Depressed, Dysphoric and Irritable  Affect:  Depressed and Flat  Thought Process:  Coherent and Goal Directed  Orientation:  Full (Time, Place, and Person)  Thought Content:  suicidal  Suicidal Thoughts:  Yes.  without intent/plan  Homicidal Thoughts:  No  Memory:  Immediate;   Good Recent;   Good Remote;   Good  Judgement:  Poor  Insight:  Shallow  Psychomotor Activity:  Normal  Concentration:  Good  Recall:  NA  Akathisia:  NA  Handed:  Right  AIMS (if indicated):     Assets:  Desire for Improvement  Sleep:      Seen this am with Dr Lucianne Muss.  Patient did not want to talk to providers but wanted readmission to [facility].  Patient was recently discharged from our inpatient unit after detox from alcohol and cocaine.  Patient was rude in am and did not want to participate in the interview.  He endorsed suicide at that time but denies HI/AVH.   This afternoon, patient was apologized to this Clinical research associate and stated he was angry at himself for drinking and using cocaine right after detox.  He asked to be discharged with outpatient referral at [facility].  Patient will be discharged, he denies SI/HI/AVH.  He is calm, cooperative and happy to leave.  Patient states he has his mother to live for and his mother is in the hospital.   Dahlia Byes   PMHNP-BC

## 2013-03-06 NOTE — Progress Notes (Signed)
This Clinical research associate received a call from [facility] in reference to placement. Patient was declined due to acuity.

## 2013-03-09 ENCOUNTER — Emergency Department (HOSPITAL_COMMUNITY)
Admission: EM | Admit: 2013-03-09 | Discharge: 2013-03-10 | Disposition: A | Discharge status: Home / Self Care | Payer: Self-pay | Attending: Emergency Medicine | Admitting: Emergency Medicine

## 2013-03-09 ENCOUNTER — Encounter (HOSPITAL_COMMUNITY): Payer: Self-pay | Admitting: Emergency Medicine

## 2013-03-09 DIAGNOSIS — F1994 Other psychoactive substance use, unspecified with psychoactive substance-induced mood disorder: Secondary | ICD-10-CM | POA: Diagnosis present

## 2013-03-09 DIAGNOSIS — F1910 Other psychoactive substance abuse, uncomplicated: Secondary | ICD-10-CM | POA: Insufficient documentation

## 2013-03-09 DIAGNOSIS — R4789 Other speech disturbances: Secondary | ICD-10-CM | POA: Insufficient documentation

## 2013-03-09 DIAGNOSIS — R011 Cardiac murmur, unspecified: Secondary | ICD-10-CM | POA: Insufficient documentation

## 2013-03-09 DIAGNOSIS — K219 Gastro-esophageal reflux disease without esophagitis: Secondary | ICD-10-CM | POA: Insufficient documentation

## 2013-03-09 DIAGNOSIS — F17200 Nicotine dependence, unspecified, uncomplicated: Secondary | ICD-10-CM | POA: Insufficient documentation

## 2013-03-09 DIAGNOSIS — F411 Generalized anxiety disorder: Secondary | ICD-10-CM | POA: Insufficient documentation

## 2013-03-09 DIAGNOSIS — R45851 Suicidal ideations: Secondary | ICD-10-CM | POA: Insufficient documentation

## 2013-03-09 DIAGNOSIS — R404 Transient alteration of awareness: Secondary | ICD-10-CM | POA: Insufficient documentation

## 2013-03-09 DIAGNOSIS — F3289 Other specified depressive episodes: Secondary | ICD-10-CM | POA: Insufficient documentation

## 2013-03-09 DIAGNOSIS — F1010 Alcohol abuse, uncomplicated: Secondary | ICD-10-CM | POA: Insufficient documentation

## 2013-03-09 DIAGNOSIS — F1410 Cocaine abuse, uncomplicated: Secondary | ICD-10-CM

## 2013-03-09 DIAGNOSIS — F329 Major depressive disorder, single episode, unspecified: Secondary | ICD-10-CM | POA: Insufficient documentation

## 2013-03-09 DIAGNOSIS — F10929 Alcohol use, unspecified with intoxication, unspecified: Secondary | ICD-10-CM | POA: Diagnosis present

## 2013-03-09 DIAGNOSIS — F1020 Alcohol dependence, uncomplicated: Secondary | ICD-10-CM | POA: Diagnosis present

## 2013-03-09 LAB — CBC
Hemoglobin: 15.9 g/dL (ref 13.0–17.0)
MCV: 86.1 fL (ref 78.0–100.0)
Platelets: 249 10*3/uL (ref 150–400)
RBC: 5.03 MIL/uL (ref 4.22–5.81)
RDW: 14 % (ref 11.5–15.5)
WBC: 12.3 10*3/uL — ABNORMAL HIGH (ref 4.0–10.5)

## 2013-03-09 LAB — SALICYLATE LEVEL: Salicylate Lvl: <2 mg/dL — ABNORMAL LOW (ref 2.8–20.0)

## 2013-03-09 LAB — RAPID URINE DRUG SCREEN, HOSP PERFORMED
Amphetamines: NOT DETECTED
Benzodiazepines: POSITIVE — AB
Cocaine: POSITIVE — AB

## 2013-03-09 LAB — COMPREHENSIVE METABOLIC PANEL
ALT: 25 U/L (ref 0–53)
AST: 40 U/L — ABNORMAL HIGH (ref 0–37)
Albumin: 3.7 g/dL (ref 3.5–5.2)
Chloride: 96 mEq/L (ref 96–112)
Creatinine, Ser: 1.05 mg/dL (ref 0.50–1.35)
GFR calc non Af Amer: >90 mL/min (ref >90)
Potassium: 4.3 mEq/L (ref 3.5–5.1)
Total Bilirubin: 0.6 mg/dL (ref 0.3–1.2)

## 2013-03-09 LAB — ACETAMINOPHEN LEVEL: Acetaminophen (Tylenol), Serum: <15 ug/mL (ref 10–30)

## 2013-03-09 MED ORDER — ONDANSETRON HCL 4 MG PO TABS: 4.0000 mg | ORAL_TABLET | Freq: Three times a day (TID) | ORAL | Status: DC | PRN

## 2013-03-09 MED ORDER — ALUM & MAG HYDROXIDE-SIMETH 200-200-20 MG/5ML PO SUSP: 30.0000 mL | ORAL | Status: DC | PRN

## 2013-03-09 MED ORDER — IBUPROFEN 400 MG PO TABS: 600.0000 mg | ORAL_TABLET | Freq: Three times a day (TID) | ORAL | Status: DC | PRN

## 2013-03-09 MED ORDER — NICOTINE 21 MG/24HR TD PT24
21.0000 mg | MEDICATED_PATCH | Freq: Every day | TRANSDERMAL | Status: DC
  Filled 2013-03-09: qty 1
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
## 2013-03-09 NOTE — BH Assessment (Signed)
Baxter Hire, CSW, asked [facility] to initiate referral submissions for patient. [facility] will begin finding placement for patient.  -Dossie Arbour, MA  Disposition [facility]

## 2013-03-09 NOTE — ED Notes (Signed)
TTS being completed at bedside.  

## 2013-03-09 NOTE — ED Notes (Signed)
Patient not present in the room as of yet.

## 2013-03-09 NOTE — BH Assessment (Signed)
[facility] Assessment Progress Note [facility] called with a tele assessment request.  This appt made for 1230.  Called [place], [place], for clinical for this pt @ 1223.
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
## 2013-03-09 NOTE — ED Notes (Signed)
Per pt sts he has been using alcohol and cocaine and is having suicidal thoughts.

## 2013-03-09 NOTE — ED Notes (Signed)
Ordered Lunch tray 

## 2013-03-09 NOTE — BH Assessment (Addendum)
Referrals submitted to [place], and [place].  Dossie Arbour, MA  Disposition [facility]

## 2013-03-09 NOTE — ED Notes (Signed)
Dinner arrived and Pt is going to wait to eat.

## 2013-03-10 ENCOUNTER — Encounter (HOSPITAL_COMMUNITY): Payer: Self-pay | Admitting: Psychiatry

## 2013-03-10 DIAGNOSIS — F1410 Cocaine abuse, uncomplicated: Secondary | ICD-10-CM

## 2013-03-10 DIAGNOSIS — F1020 Alcohol dependence, uncomplicated: Secondary | ICD-10-CM

## 2013-03-10 NOTE — BH Assessment (Signed)
[facility] Assessment Progress Note Pt seen for reassessment this day.  Pt stated, "I feel a lot better."  Pt stated he ate and slept well in the ED overnight, "and I have been talking about getting my life back together."  Pt denies SI, HI or psychosis at this time.  Consulted with EDP Rancour @ 1110, who would like pt to be evaluated by an extender to determine if pt can be discharged, since he denies SI currently.  An appt with an extender will be set up for further evaluation and recommendations.

## 2013-03-10 NOTE — BH Assessment (Signed)
[facility] Assessment Progress Note   Pt's tele assessment appt scheduled for 1215 with Nanine Means, NP.  Pt's nurse notified.

## 2013-03-10 NOTE — ED Notes (Signed)
Pt up to bathroom and back to room.  No complaints voiced. Sitter at bedside.

## 2013-03-10 NOTE — BH Assessment (Signed)
[facility] Assessment Progress Note Pt scheduled for reassessment with this clinician for 1100 with pt's nurse, Drinda Butts.

## 2013-03-10 NOTE — ED Notes (Signed)
AFTER READING PT CONVERSATION WITH COUNSELOR. SPOKE WITH PT ABOUT HIS DESIRE TO RETURN TO [name]. PT STATES "I AM GOOD ON THAT SUICIDAL THING. I AINT NO MORE, BUT I GOT TO GO SOMEWHERE ABOUT THIS DRINKING". PT HAS REQUESTED TO GO TO [facility] OR [facility].

## 2013-03-10 NOTE — Progress Notes (Signed)
Weekend CSW met with patient to offer shelter information, substance abuse treatment list, as well as provide a bus pass- patient denies having anyone that can pick him up. Patient thanked CSW for assistance.  Samuella Bruin, MSW, LCSWA Clinical Social Worker [facility] Emergency Dept. [phone]
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
## 2013-03-10 NOTE — ED Provider Notes (Signed)
Patient has been seen by psychiatry and is no longer suicidal. Denies SI or HI and contracts for safety. He is stable for outpatient followup for polysubstance abuse. Vitals stable. No evidence of active alcohol withdrawal. BP 129/80  Pulse 76  Temp(Src) 97.3 F (36.3 C) (Oral)  Resp 18  SpO2 100%   Glynn Octave, MD 03/10/13 1538

## 2013-03-10 NOTE — ED Notes (Signed)
PATIENT UP TO DESK TO MAKE A PHONE CALL. HE IS CALM AND LAUGHING ON PHONE.

## 2013-03-10 NOTE — ED Notes (Signed)
Called telepsych to check on delay in pt reeval by extender. Still waiting for extender to be available for reeval per [facility]

## 2013-03-10 NOTE — BH Assessment (Signed)
[facility] Assessment Progress Note  Update:  Informed pt's nurse of EDP Rancour's order to d/c home and Nanine Means, NP's agreement and recommendation for pt to follow up with [facility] or other SA treatment facility.  Pt denies SI or HI and was able to contract for safety.  A No Harm contract was faxed over for pt to sign and retain in his file.  Pt's nurse, Kriste Basque, notified of pt disposition.

## 2013-03-10 NOTE — Progress Notes (Addendum)
Spoke with Pam from [facility], stated that admission nurse reviewed referral and could accept pt but would accept r/t treatment for SI and not detox.  Would not be able to take until Monday because treatment is only Monday through Friday.  Also followed up with [facility] regarding referral that was faxed and he has been declined per Trey Paula d/t SA and failed D/C from [facility].  Tomi Bamberger, MHT

## 2013-03-11 NOTE — Consult Note (Signed)
Case discussed, agree with plan 

## 2013-03-11 NOTE — Progress Notes (Signed)
Met patient at bedside.Role of Case Manager explained.Patient reports he has no Programmer, applications or a PCP.Education provided to patient on Importance of establishing a PCP. Patient verbalizes understanding.Resource sheet provided for the [facility].Patient reports he will call them next week.Patient reports he will follow up with  [facility] on 12/ 29.Patient provided with resource card for the [employer] [facility]- no further CM Needs identified.

## 2013-03-12 ENCOUNTER — Emergency Department (HOSPITAL_COMMUNITY)
Admission: EM | Admit: 2013-03-12 | Discharge: 2013-03-13 | Disposition: A | Discharge status: Home / Self Care | Payer: Self-pay | Attending: Emergency Medicine | Admitting: Emergency Medicine

## 2013-03-12 ENCOUNTER — Encounter (HOSPITAL_COMMUNITY): Payer: Self-pay | Admitting: Emergency Medicine

## 2013-03-12 ENCOUNTER — Inpatient Hospital Stay (HOSPITAL_COMMUNITY): Admission: AD | Admit: 2013-03-12 | Payer: Self-pay | Source: Intra-hospital | Admitting: Psychiatry

## 2013-03-12 DIAGNOSIS — F1010 Alcohol abuse, uncomplicated: Secondary | ICD-10-CM | POA: Insufficient documentation

## 2013-03-12 DIAGNOSIS — IMO0002 Reserved for concepts with insufficient information to code with codable children: Secondary | ICD-10-CM

## 2013-03-12 DIAGNOSIS — R45851 Suicidal ideations: Secondary | ICD-10-CM | POA: Insufficient documentation

## 2013-03-12 DIAGNOSIS — Z8719 Personal history of other diseases of the digestive system: Secondary | ICD-10-CM | POA: Insufficient documentation

## 2013-03-12 DIAGNOSIS — F17200 Nicotine dependence, unspecified, uncomplicated: Secondary | ICD-10-CM | POA: Insufficient documentation

## 2013-03-12 DIAGNOSIS — F39 Unspecified mood [affective] disorder: Secondary | ICD-10-CM | POA: Insufficient documentation

## 2013-03-12 DIAGNOSIS — F1410 Cocaine abuse, uncomplicated: Secondary | ICD-10-CM | POA: Insufficient documentation

## 2013-03-12 DIAGNOSIS — F1910 Other psychoactive substance abuse, uncomplicated: Secondary | ICD-10-CM

## 2013-03-12 DIAGNOSIS — R011 Cardiac murmur, unspecified: Secondary | ICD-10-CM | POA: Insufficient documentation

## 2013-03-12 DIAGNOSIS — F1020 Alcohol dependence, uncomplicated: Secondary | ICD-10-CM

## 2013-03-12 DIAGNOSIS — Z87828 Personal history of other (healed) physical injury and trauma: Secondary | ICD-10-CM | POA: Insufficient documentation

## 2013-03-12 DIAGNOSIS — F1994 Other psychoactive substance use, unspecified with psychoactive substance-induced mood disorder: Secondary | ICD-10-CM

## 2013-03-12 LAB — RAPID URINE DRUG SCREEN, HOSP PERFORMED
Amphetamines: NOT DETECTED
Barbiturates: NOT DETECTED
Benzodiazepines: NOT DETECTED
Tetrahydrocannabinol: NOT DETECTED

## 2013-03-12 LAB — CBC
HCT: 45.1 % (ref 39.0–52.0)
Hemoglobin: 16.3 g/dL (ref 13.0–17.0)
MCHC: 36.1 g/dL — ABNORMAL HIGH (ref 30.0–36.0)
MCV: 85.4 fL (ref 78.0–100.0)
RBC: 5.28 MIL/uL (ref 4.22–5.81)
RDW: 14.5 % (ref 11.5–15.5)

## 2013-03-12 LAB — COMPREHENSIVE METABOLIC PANEL
ALT: 27 U/L (ref 0–53)
AST: 40 U/L — ABNORMAL HIGH (ref 0–37)
Albumin: 4 g/dL (ref 3.5–5.2)
Alkaline Phosphatase: 76 U/L (ref 39–117)
Calcium: 9 mg/dL (ref 8.4–10.5)
GFR calc Af Amer: >90 mL/min (ref >90)
Glucose, Bld: 65 mg/dL — ABNORMAL LOW (ref 70–99)
Potassium: 4 mEq/L (ref 3.5–5.1)
Sodium: 139 mEq/L (ref 135–145)
Total Protein: 7.8 g/dL (ref 6.0–8.3)

## 2013-03-12 LAB — SALICYLATE LEVEL: Salicylate Lvl: 2.1 mg/dL — ABNORMAL LOW (ref 2.8–20.0)

## 2013-03-12 LAB — ETHANOL: Alcohol, Ethyl (B): 154 mg/dL — ABNORMAL HIGH (ref 0–11)

## 2013-03-12 LAB — ACETAMINOPHEN LEVEL: Acetaminophen (Tylenol), Serum: <15 ug/mL (ref 10–30)

## 2013-03-12 MED ORDER — ACETAMINOPHEN 325 MG PO TABS
650.0000 mg | ORAL_TABLET | Freq: Four times a day (QID) | ORAL | Status: DC | PRN
  Administered 2013-03-12 – 2013-03-13 (×3): 650 mg via ORAL
  Filled 2013-03-12 (×3): qty 2

## 2013-03-12 MED ORDER — AMANTADINE HCL 100 MG PO CAPS
100.0000 mg | ORAL_CAPSULE | Freq: Two times a day (BID) | ORAL | Status: DC
  Administered 2013-03-12 – 2013-03-13 (×3): 100 mg via ORAL
  Filled 2013-03-12 (×4): qty 1

## 2013-03-12 NOTE — [facility] Counselor (Signed)
Per Shaletta at TTS, pt can not come to [facility] d/t too many admissions recently. [facility] in agreement. RN Marylou Flesher NP aware.   Evette Cristal, [place] Assessment Counselor

## 2013-03-12 NOTE — ED Notes (Signed)
Bed: [facility] Expected date: 03/12/13 Expected time: 5:02 AM Means of arrival: Ambulance Comments: suicidal

## 2013-03-12 NOTE — ED Notes (Signed)
Pt mumbling words together, unable to completely understand, states drank 8 40ozs of alcohol, smoked $300-$400 crack and took 5 percocets to hurt himself. States he was at [facility] yesterday for the same but they sent him home

## 2013-03-12 NOTE — BH Assessment (Signed)
MHT faxed referrals to [place], [place], and [facility]. [facility] will continue to look for placement.  -Dossie Arbour, MA  Disposition [facility]

## 2013-03-12 NOTE — ED Provider Notes (Signed)
Medical screening examination/treatment/procedure(s) were performed by non-physician practitioner and as supervising physician I was immediately available for consultation/collaboration.  EKG Interpretation   None         Annagrace Carr N Mikiyah Glasner, DO 03/12/13 0713 

## 2013-03-12 NOTE — [facility] Counselor (Signed)
Writer attempted to assess but pt is still too drowsy. [facility] or [facility] extender will assess later this am.   Evette Cristal, LCSWA Assessment Counselor

## 2013-03-12 NOTE — BH Assessment (Signed)
Spoke to [place] NP regarding patients admission to [facility], pt was at [facility] 3 times in October and 3 times in December and has been noncompliant with treatment. Pt has also been noncompliant with follow up services. After consulting with Julieanne Cotton it was decided that patient did not need to be admitted to [facility], and that placement needed to be found elsewhere. Ethelene Browns disposition M[facility] made aware, and Heron Sabins at [facility] ED made aware.

## 2013-03-12 NOTE — ED Notes (Signed)
2 pt belonging bags placed in locker 27, pt and belongings wanded by security

## 2013-03-12 NOTE — [facility] Counselor (Signed)
[facility] registration dept looked up pt on [place] [employer] and found that pt doesn't have Medicaid. Pt also sts he doesn't have Medicaid. Pt's facesheet had originally shown he had Medicaid.   Evette Cristal, [place] Assessment Counselor

## 2013-03-12 NOTE — [facility] Counselor (Signed)
Recv'd call from TTS in regards to interview for this pt.  He is not medically cleared as of 0617am and will need to be passed to morning TTS for assessment.

## 2013-03-12 NOTE — ED Notes (Signed)
Per EMS pt outside running in and out of traffic, states drinking all day, crack and pills for SA

## 2013-03-12 NOTE — Consult Note (Signed)
Pt was interviewed with NP. Chart reviewed. Agree with above assessment and plan. 

## 2013-03-12 NOTE — [facility] Counselor (Signed)
Per TTS Ava, pt has been accepted to bed 306-2.  Completed support paperwork faxed to [facility] and originals placed in pt's chart. Pt's RN Minerva Areola aware.  Evette Cristal, [place] Assessment Counselor

## 2013-03-12 NOTE — BH Assessment (Signed)
Patient accepted to [facility] by Dahlia Byes, NP. Attending MD Dub Mikes, assigned bed #306-2.
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
## 2013-03-13 ENCOUNTER — Encounter (HOSPITAL_COMMUNITY): Payer: Self-pay | Admitting: Registered Nurse

## 2013-03-13 DIAGNOSIS — F1994 Other psychoactive substance use, unspecified with psychoactive substance-induced mood disorder: Secondary | ICD-10-CM

## 2013-03-13 DIAGNOSIS — F1020 Alcohol dependence, uncomplicated: Secondary | ICD-10-CM

## 2013-03-13 DIAGNOSIS — F1410 Cocaine abuse, uncomplicated: Secondary | ICD-10-CM

## 2013-03-13 DIAGNOSIS — R69 Illness, unspecified: Secondary | ICD-10-CM

## 2013-03-13 DIAGNOSIS — R45851 Suicidal ideations: Secondary | ICD-10-CM
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
## 2013-03-13 NOTE — ED Notes (Signed)
Pt asked to speak with RN, pt states "If I'm not suicidal anymore can my girl come pick me up?", pt states that he does not want to go to [facility] and is no longer suicidal, NP notified.

## 2013-03-13 NOTE — Progress Notes (Signed)
[facility] provided pt with a [employer] Orange Card application, highlighting [facility].  °

## 2013-03-13 NOTE — ED Notes (Signed)
Stable and ambulatory.  Pt husband at bedside

## 2013-03-13 NOTE — Progress Notes (Signed)
Patient observed sleeping, respirations even and unlabored. Patient safety maintained, Q 15 checks continue.  

## 2013-03-13 NOTE — Progress Notes (Signed)
Per Herbert Seta at [facility] the patients referral has been received and is under review by the Doctor.  Herbert Seta reports that she will call back once the doctor has reviewed the patients chart.

## 2013-03-13 NOTE — ED Provider Notes (Signed)
11:44 AM  D/w Catha Gosselin with SW.  Patient has contracted for safety and has been referred for outpatient substance abuse. He has been seen by Assunta Found, NP with psychiatry and case was discussed with Dr. Lucianne Muss. Will discharge. I was not directly involved in this patient's care  Layla Maw Keina Mutch, DO 03/13/13 1145

## 2013-03-20 NOTE — Consult Note (Signed)
Case discussed, and agree with plan 

## 2013-03-22 ENCOUNTER — Emergency Department (HOSPITAL_COMMUNITY): Payer: Self-pay

## 2013-03-22 ENCOUNTER — Emergency Department (HOSPITAL_COMMUNITY)
Admission: EM | Admit: 2013-03-22 | Discharge: 2013-03-23 | Disposition: A | Discharge status: Home / Self Care | Payer: Self-pay | Attending: Emergency Medicine | Admitting: Emergency Medicine

## 2013-03-22 ENCOUNTER — Encounter (HOSPITAL_COMMUNITY): Payer: Self-pay | Admitting: Emergency Medicine

## 2013-03-22 DIAGNOSIS — Z8659 Personal history of other mental and behavioral disorders: Secondary | ICD-10-CM | POA: Insufficient documentation

## 2013-03-22 DIAGNOSIS — R45851 Suicidal ideations: Secondary | ICD-10-CM | POA: Insufficient documentation

## 2013-03-22 DIAGNOSIS — F17200 Nicotine dependence, unspecified, uncomplicated: Secondary | ICD-10-CM | POA: Insufficient documentation

## 2013-03-22 DIAGNOSIS — Z8719 Personal history of other diseases of the digestive system: Secondary | ICD-10-CM | POA: Insufficient documentation

## 2013-03-22 DIAGNOSIS — F1910 Other psychoactive substance abuse, uncomplicated: Secondary | ICD-10-CM

## 2013-03-22 DIAGNOSIS — R011 Cardiac murmur, unspecified: Secondary | ICD-10-CM | POA: Insufficient documentation

## 2013-03-22 DIAGNOSIS — F1020 Alcohol dependence, uncomplicated: Secondary | ICD-10-CM | POA: Insufficient documentation

## 2013-03-22 DIAGNOSIS — F1410 Cocaine abuse, uncomplicated: Secondary | ICD-10-CM | POA: Insufficient documentation

## 2013-03-22 LAB — URINALYSIS, ROUTINE W REFLEX MICROSCOPIC
Bilirubin Urine: NEGATIVE
GLUCOSE, UA: NEGATIVE mg/dL
Ketones, ur: 15 mg/dL — AB
LEUKOCYTES UA: NEGATIVE
Nitrite: NEGATIVE
PH: 5.5 (ref 5.0–8.0)
PROTEIN: NEGATIVE mg/dL
SPECIFIC GRAVITY, URINE: 1.017 (ref 1.005–1.030)
Urobilinogen, UA: 0.2 mg/dL (ref 0.0–1.0)

## 2013-03-22 LAB — ETHANOL: ALCOHOL ETHYL (B): 244 mg/dL — AB (ref 0–11)

## 2013-03-22 LAB — CBC WITH DIFFERENTIAL/PLATELET
BASOS ABS: 0 10*3/uL (ref 0.0–0.1)
BASOS PCT: 0 % (ref 0–1)
EOS ABS: 0 10*3/uL (ref 0.0–0.7)
Eosinophils Relative: 0 % (ref 0–5)
HEMATOCRIT: 43.6 % (ref 39.0–52.0)
Hemoglobin: 15.8 g/dL (ref 13.0–17.0)
Lymphocytes Relative: 22 % (ref 12–46)
Lymphs Abs: 2.5 10*3/uL (ref 0.7–4.0)
MCH: 31.4 pg (ref 26.0–34.0)
MCHC: 36.2 g/dL — AB (ref 30.0–36.0)
MCV: 86.7 fL (ref 78.0–100.0)
Monocytes Absolute: 1.4 10*3/uL — ABNORMAL HIGH (ref 0.1–1.0)
Monocytes Relative: 12 % (ref 3–12)
NEUTROS ABS: 7.7 10*3/uL (ref 1.7–7.7)
NEUTROS PCT: 66 % (ref 43–77)
Platelets: 258 10*3/uL (ref 150–400)
RBC: 5.03 MIL/uL (ref 4.22–5.81)
RDW: 14.2 % (ref 11.5–15.5)
WBC: 11.6 10*3/uL — ABNORMAL HIGH (ref 4.0–10.5)

## 2013-03-22 LAB — COMPREHENSIVE METABOLIC PANEL
ALT: 24 U/L (ref 0–53)
AST: 45 U/L — AB (ref 0–37)
Albumin: 3.9 g/dL (ref 3.5–5.2)
Alkaline Phosphatase: 69 U/L (ref 39–117)
BILIRUBIN TOTAL: 0.2 mg/dL — AB (ref 0.3–1.2)
BUN: 13 mg/dL (ref 6–23)
CHLORIDE: 101 meq/L (ref 96–112)
CO2: 26 mEq/L (ref 19–32)
CREATININE: 0.78 mg/dL (ref 0.50–1.35)
Calcium: 8.8 mg/dL (ref 8.4–10.5)
GFR calc Af Amer: >90 mL/min (ref >90)
GFR calc non Af Amer: >90 mL/min (ref >90)
Glucose, Bld: 79 mg/dL (ref 70–99)
Potassium: 4.1 mEq/L (ref 3.7–5.3)
Sodium: 145 mEq/L (ref 137–147)
TOTAL PROTEIN: 7.9 g/dL (ref 6.0–8.3)

## 2013-03-22 LAB — RAPID URINE DRUG SCREEN, HOSP PERFORMED
Amphetamines: NOT DETECTED
BENZODIAZEPINES: NOT DETECTED
Barbiturates: NOT DETECTED
Cocaine: POSITIVE — AB
OPIATES: NOT DETECTED
Tetrahydrocannabinol: NOT DETECTED

## 2013-03-22 LAB — URINE MICROSCOPIC-ADD ON

## 2013-03-22 MED ORDER — LORAZEPAM 1 MG PO TABS
0.0000 mg | ORAL_TABLET | Freq: Four times a day (QID) | ORAL | Status: DC
  Administered 2013-03-22 (×2): 1 mg via ORAL
  Filled 2013-03-22 (×2): qty 1

## 2013-03-22 MED ORDER — NICOTINE 21 MG/24HR TD PT24
21.0000 mg | MEDICATED_PATCH | Freq: Every day | TRANSDERMAL | Status: DC
  Filled 2013-03-22: qty 1

## 2013-03-22 MED ORDER — ALUM & MAG HYDROXIDE-SIMETH 200-200-20 MG/5ML PO SUSP: 30.0000 mL | ORAL | Status: DC | PRN

## 2013-03-22 MED ORDER — IBUPROFEN 400 MG PO TABS
600.0000 mg | ORAL_TABLET | Freq: Three times a day (TID) | ORAL | Status: DC | PRN
  Administered 2013-03-22: 600 mg via ORAL
  Filled 2013-03-22 (×2): qty 1

## 2013-03-22 MED ORDER — LORAZEPAM 1 MG PO TABS: 1.0000 mg | ORAL_TABLET | Freq: Three times a day (TID) | ORAL | Status: DC | PRN

## 2013-03-22 MED ORDER — ONDANSETRON HCL 4 MG PO TABS: 4.0000 mg | ORAL_TABLET | Freq: Three times a day (TID) | ORAL | Status: DC | PRN

## 2013-03-22 MED ORDER — LORAZEPAM 1 MG PO TABS: 0.0000 mg | ORAL_TABLET | Freq: Two times a day (BID) | ORAL | Status: DC

## 2013-03-22 NOTE — ED Notes (Signed)
Report called to HouseKenisha, Charity fundraiserN. [facility].

## 2013-03-22 NOTE — Progress Notes (Signed)
The following facilities have been contacted regarding inptx:  [place]: per Harriett SineNancy can fax referral, referral has been faxed [facility]: per Lloyd HugerNeil, no beds available [facility]: per Marchelle FolksAmanda can fax referral, referral has been faxed [place]: [facility] [place]: per [place] [facility] [place]: per [employer] beds available and can fax, referral faxed [place]: per Johnny BridgeMartha at capacity [facility]: per Rinaldo CloudPamela they currently have no beds available but can fax bc they will have D/C's tomorrow, referral faxed [place]: per Rinaldo CloudPamela can fax referral, referral has been faxed  Will begin application for [facility] at this time as recommended by admin and [facility].   Tomi BambergerMariya Anysha Frappier, MHT

## 2013-03-22 NOTE — ED Notes (Signed)
Pt out of room and at nurses station requesting pain medication for pain in the hand.  Security called and escorted pt back to room.

## 2013-03-22 NOTE — ED Notes (Signed)
Pt has limited movement of his left hand from a fall sustained this am.  Pt has scant bleeding on the pinky finger and a blood blister on the palm of the hand at the pinky.

## 2013-03-22 NOTE — Treatment Plan (Addendum)
[facility] and admin due to the fact that he has [facility] since October 2014 (past 3 months).  Pt needs a higher level of care to treat his chronic SI.  Please seek a [facility] placement as pt meets criteria for long-term placement,

## 2013-03-22 NOTE — ED Notes (Signed)
[facility], [facility]

## 2013-03-22 NOTE — Progress Notes (Signed)
[facility] to [facility], spoke with Dewayne HatchAnn, and started application process for pt.  Also placed call to [facility] and spoke with Claris CheMargaret to get authorization number.    Auth# is [id number] and is good from 03/22/2013-03/28/2013  Application has been completed and faxed to [facility].   Tomi BambergerMariya Moody Robben, MHT

## 2013-03-22 NOTE — ED Notes (Signed)
Pt c/o of having suicidal ideation and having cocaine and alcohol that he took today.  Pt also complains of Left hand pain from a fall he sustained this morning around 3:30.

## 2013-03-22 NOTE — ED Notes (Signed)
[facility] called for transport. 

## 2013-03-22 NOTE — [facility] Counselor (Addendum)
Writer saw that a TTS consult was ordered at  0530.Marland Kitchen. However, the consult order wasn't called in to TTS office. Writer called EDP Docherty to discuss pt. Writer spoke w/ RN Drinda Buttsnnette who states that pt is "knocked out" due to lack of sleep and drug use. Writer will conduct teleassesment at 10:30 am if pt alert.  Evette Cristalaroline Paige Mardy Lucier, [place] Assessment Counselor
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
## 2013-03-22 NOTE — ED Notes (Signed)
First contact with pt. Pt speaking quickly with flight of ideas noted. Placed in room 21 and informed rules. Pt agreeable. Warm blanket given.
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
## 2013-03-22 NOTE — ED Notes (Signed)
Pt resting quietly at the time. Vital signs stable. Sitter remains at bedside. No signs of distress noted. Pt remains alert and oriented x4.

## 2013-03-23 DIAGNOSIS — F1910 Other psychoactive substance abuse, uncomplicated: Secondary | ICD-10-CM

## 2013-03-23 DIAGNOSIS — F1020 Alcohol dependence, uncomplicated: Secondary | ICD-10-CM

## 2013-03-23 DIAGNOSIS — F1410 Cocaine abuse, uncomplicated: Secondary | ICD-10-CM

## 2013-03-23 LAB — ETHANOL: Alcohol, Ethyl (B): <11 mg/dL (ref 0–11)

## 2013-03-23 NOTE — ED Provider Notes (Signed)
Medical screening examination/treatment/procedure(s) were performed by non-physician practitioner and as supervising physician I was immediately available for consultation/collaboration.  EKG Interpretation   None         Jelena Malicoat, MD 03/23/13 0735 
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
## 2013-03-23 NOTE — ED Provider Notes (Signed)
Patient no longer wants to stay. Has been seen by Psych PA, who discussed with Dr Lucianne MussKumar. [facility]  Adam Grobe R. Rubin PayorPickering, MD 03/23/13 1606

## 2013-03-23 NOTE — BH Assessment (Signed)
Fransisca KaufmannLaura Davis, NP will see patient at 1500, Gena FrayChris RN made aware.

## 2013-03-23 NOTE — ED Notes (Signed)
Tele assess at bedside, pt informed of reassessment and decision after assessment. Pt agrees to wait for further eval.

## 2013-03-23 NOTE — Discharge Instructions (Signed)

## 2013-03-23 NOTE — ED Notes (Addendum)
Pt states he would like to go home. Pt states he is not going to hurt himself after no harm contract. Does not want to wait for placement. Dr.Pickering and B. Wobble informed.

## 2013-03-24 NOTE — Consult Note (Signed)
Case discussed, plan formulated by me 

## 2013-03-26 ENCOUNTER — Encounter (HOSPITAL_COMMUNITY): Payer: Self-pay | Admitting: Emergency Medicine

## 2013-03-26 ENCOUNTER — Emergency Department (HOSPITAL_COMMUNITY)
Admission: EM | Admit: 2013-03-26 | Discharge: 2013-03-26 | Discharge status: Against Medical Advice | Payer: Self-pay | Attending: Emergency Medicine | Admitting: Emergency Medicine

## 2013-03-26 DIAGNOSIS — F17200 Nicotine dependence, unspecified, uncomplicated: Secondary | ICD-10-CM | POA: Insufficient documentation

## 2013-03-26 DIAGNOSIS — Z008 Encounter for other general examination: Secondary | ICD-10-CM | POA: Insufficient documentation

## 2013-03-26 NOTE — ED Notes (Signed)
Called x 3 no answer 

## 2013-03-26 NOTE — ED Notes (Signed)
Unable to find to draw labs

## 2013-03-26 NOTE — ED Notes (Signed)
Called to room.  Patient didn't answer.

## 2013-03-26 NOTE — ED Notes (Signed)
Pt is here for detox from ETOH last use one hour ago, MOLLY, cocaine, and crack.  Pt denies SI/HI.

## 2013-04-11 ENCOUNTER — Inpatient Hospital Stay (HOSPITAL_COMMUNITY)
Admission: AD | Admit: 2013-04-11 | Discharge: 2013-04-13 | DRG: 897 | Disposition: A | Discharge status: Home / Self Care | Payer: No Typology Code available for payment source | Source: Intra-hospital | Attending: Psychiatry | Admitting: Psychiatry

## 2013-04-11 ENCOUNTER — Emergency Department (HOSPITAL_COMMUNITY)
Admission: EM | Admit: 2013-04-11 | Discharge: 2013-04-11 | Disposition: A | Discharge status: Other Acute Inpatient Hospital | Payer: Self-pay | Attending: Emergency Medicine | Admitting: Emergency Medicine

## 2013-04-11 ENCOUNTER — Encounter (HOSPITAL_COMMUNITY): Payer: Self-pay | Admitting: *Deleted

## 2013-04-11 ENCOUNTER — Encounter (HOSPITAL_COMMUNITY): Payer: Self-pay | Admitting: Emergency Medicine

## 2013-04-11 DIAGNOSIS — R011 Cardiac murmur, unspecified: Secondary | ICD-10-CM | POA: Insufficient documentation

## 2013-04-11 DIAGNOSIS — Z8739 Personal history of other diseases of the musculoskeletal system and connective tissue: Secondary | ICD-10-CM | POA: Insufficient documentation

## 2013-04-11 DIAGNOSIS — F1410 Cocaine abuse, uncomplicated: Secondary | ICD-10-CM | POA: Diagnosis present

## 2013-04-11 DIAGNOSIS — F17200 Nicotine dependence, unspecified, uncomplicated: Secondary | ICD-10-CM | POA: Insufficient documentation

## 2013-04-11 DIAGNOSIS — R45851 Suicidal ideations: Secondary | ICD-10-CM

## 2013-04-11 DIAGNOSIS — F1994 Other psychoactive substance use, unspecified with psychoactive substance-induced mood disorder: Secondary | ICD-10-CM | POA: Diagnosis present

## 2013-04-11 DIAGNOSIS — Z8659 Personal history of other mental and behavioral disorders: Secondary | ICD-10-CM | POA: Insufficient documentation

## 2013-04-11 DIAGNOSIS — Z8719 Personal history of other diseases of the digestive system: Secondary | ICD-10-CM | POA: Insufficient documentation

## 2013-04-11 DIAGNOSIS — F10929 Alcohol use, unspecified with intoxication, unspecified: Secondary | ICD-10-CM

## 2013-04-11 DIAGNOSIS — F411 Generalized anxiety disorder: Secondary | ICD-10-CM | POA: Diagnosis present

## 2013-04-11 DIAGNOSIS — F329 Major depressive disorder, single episode, unspecified: Secondary | ICD-10-CM | POA: Diagnosis present

## 2013-04-11 DIAGNOSIS — F1010 Alcohol abuse, uncomplicated: Secondary | ICD-10-CM | POA: Insufficient documentation

## 2013-04-11 DIAGNOSIS — F1020 Alcohol dependence, uncomplicated: Principal | ICD-10-CM | POA: Diagnosis present

## 2013-04-11 LAB — CBC WITH DIFFERENTIAL/PLATELET
Basophils Absolute: 0 10*3/uL (ref 0.0–0.1)
Basophils Relative: 0 % (ref 0–1)
Eosinophils Absolute: 0 10*3/uL (ref 0.0–0.7)
Eosinophils Relative: 0 % (ref 0–5)
HCT: 42.5 % (ref 39.0–52.0)
HEMOGLOBIN: 15.6 g/dL (ref 13.0–17.0)
LYMPHS ABS: 2.5 10*3/uL (ref 0.7–4.0)
Lymphocytes Relative: 25 % (ref 12–46)
MCH: 31 pg (ref 26.0–34.0)
MCHC: 36.7 g/dL — ABNORMAL HIGH (ref 30.0–36.0)
MCV: 84.5 fL (ref 78.0–100.0)
Monocytes Absolute: 0.8 10*3/uL (ref 0.1–1.0)
Monocytes Relative: 7 % (ref 3–12)
NEUTROS PCT: 67 % (ref 43–77)
Neutro Abs: 6.8 10*3/uL (ref 1.7–7.7)
Platelets: 267 10*3/uL (ref 150–400)
RBC: 5.03 MIL/uL (ref 4.22–5.81)
RDW: 14.2 % (ref 11.5–15.5)
WBC: 10.1 10*3/uL (ref 4.0–10.5)

## 2013-04-11 LAB — RAPID URINE DRUG SCREEN, HOSP PERFORMED
Amphetamines: NOT DETECTED
Barbiturates: NOT DETECTED
Benzodiazepines: NOT DETECTED
Cocaine: NOT DETECTED
Opiates: NOT DETECTED
Tetrahydrocannabinol: NOT DETECTED

## 2013-04-11 LAB — ACETAMINOPHEN LEVEL: Acetaminophen (Tylenol), Serum: <15 ug/mL (ref 10–30)

## 2013-04-11 LAB — COMPREHENSIVE METABOLIC PANEL
ALK PHOS: 70 U/L (ref 39–117)
ALT: 17 U/L (ref 0–53)
AST: 19 U/L (ref 0–37)
Albumin: 3.8 g/dL (ref 3.5–5.2)
BUN: 12 mg/dL (ref 6–23)
CHLORIDE: 105 meq/L (ref 96–112)
CO2: 19 mEq/L (ref 19–32)
Calcium: 9 mg/dL (ref 8.4–10.5)
Creatinine, Ser: 0.85 mg/dL (ref 0.50–1.35)
GFR calc non Af Amer: >90 mL/min (ref >90)
Glucose, Bld: 65 mg/dL — ABNORMAL LOW (ref 70–99)
POTASSIUM: 4.2 meq/L (ref 3.7–5.3)
Sodium: 145 mEq/L (ref 137–147)
Total Bilirubin: 0.2 mg/dL — ABNORMAL LOW (ref 0.3–1.2)
Total Protein: 7.8 g/dL (ref 6.0–8.3)

## 2013-04-11 LAB — SALICYLATE LEVEL

## 2013-04-11 LAB — ETHANOL: ALCOHOL ETHYL (B): 164 mg/dL — AB (ref 0–11)

## 2013-04-11 MED ORDER — ACETAMINOPHEN 325 MG PO TABS: 650.0000 mg | ORAL_TABLET | ORAL | Status: DC | PRN

## 2013-04-11 MED ORDER — NICOTINE 7 MG/24HR TD PT24
7.0000 mg | MEDICATED_PATCH | Freq: Once | TRANSDERMAL | Status: DC
  Administered 2013-04-11: 7 mg via TRANSDERMAL
  Filled 2013-04-11: qty 1

## 2013-04-11 MED ORDER — ADULT MULTIVITAMIN W/MINERALS CH
1.0000 | ORAL_TABLET | Freq: Every day | ORAL | Status: DC
  Administered 2013-04-12 – 2013-04-13 (×2): 1 via ORAL
  Filled 2013-04-11 (×5): qty 1

## 2013-04-11 MED ORDER — THIAMINE HCL 100 MG/ML IJ SOLN: 100.0000 mg | Freq: Once | INTRAMUSCULAR | Status: DC

## 2013-04-11 MED ORDER — MAGNESIUM HYDROXIDE 400 MG/5ML PO SUSP: 30.0000 mL | Freq: Every day | ORAL | Status: DC | PRN

## 2013-04-11 MED ORDER — CHLORDIAZEPOXIDE HCL 25 MG PO CAPS: 25.0000 mg | ORAL_CAPSULE | Freq: Three times a day (TID) | ORAL | Status: DC

## 2013-04-11 MED ORDER — ALUM & MAG HYDROXIDE-SIMETH 200-200-20 MG/5ML PO SUSP: 30.0000 mL | ORAL | Status: DC | PRN

## 2013-04-11 MED ORDER — ZOLPIDEM TARTRATE 5 MG PO TABS: 5.0000 mg | ORAL_TABLET | Freq: Every evening | ORAL | Status: DC | PRN

## 2013-04-11 MED ORDER — LOPERAMIDE HCL 2 MG PO CAPS: 2.0000 mg | ORAL_CAPSULE | ORAL | Status: DC | PRN

## 2013-04-11 MED ORDER — TRAZODONE HCL 50 MG PO TABS
50.0000 mg | ORAL_TABLET | Freq: Every evening | ORAL | Status: DC | PRN
  Administered 2013-04-11 – 2013-04-12 (×2): 50 mg via ORAL
  Filled 2013-04-11: qty 1
  Filled 2013-04-11: qty 28
  Filled 2013-04-11: qty 1

## 2013-04-11 MED ORDER — CHLORDIAZEPOXIDE HCL 25 MG PO CAPS: 25.0000 mg | ORAL_CAPSULE | Freq: Every day | ORAL | Status: DC

## 2013-04-11 MED ORDER — VITAMIN B-1 100 MG PO TABS
100.0000 mg | ORAL_TABLET | Freq: Every day | ORAL | Status: DC
  Administered 2013-04-12 – 2013-04-13 (×2): 100 mg via ORAL
  Filled 2013-04-11 (×5): qty 1

## 2013-04-11 MED ORDER — ACETAMINOPHEN 325 MG PO TABS: 650.0000 mg | ORAL_TABLET | Freq: Four times a day (QID) | ORAL | Status: DC | PRN

## 2013-04-11 MED ORDER — CHLORDIAZEPOXIDE HCL 25 MG PO CAPS: 25.0000 mg | ORAL_CAPSULE | ORAL | Status: DC

## 2013-04-11 MED ORDER — CHLORDIAZEPOXIDE HCL 25 MG PO CAPS: 25.0000 mg | ORAL_CAPSULE | Freq: Four times a day (QID) | ORAL | Status: DC | PRN

## 2013-04-11 MED ORDER — ONDANSETRON 4 MG PO TBDP: 4.0000 mg | ORAL_TABLET | Freq: Four times a day (QID) | ORAL | Status: DC | PRN

## 2013-04-11 MED ORDER — HYDROXYZINE HCL 25 MG PO TABS: 25.0000 mg | ORAL_TABLET | Freq: Four times a day (QID) | ORAL | Status: DC | PRN

## 2013-04-11 MED ORDER — CHLORDIAZEPOXIDE HCL 25 MG PO CAPS
25.0000 mg | ORAL_CAPSULE | Freq: Four times a day (QID) | ORAL | Status: AC
  Administered 2013-04-11 – 2013-04-13 (×6): 25 mg via ORAL
  Filled 2013-04-11 (×6): qty 1

## 2013-04-11 MED ORDER — ONDANSETRON HCL 4 MG PO TABS: 4.0000 mg | ORAL_TABLET | Freq: Three times a day (TID) | ORAL | Status: DC | PRN

## 2013-04-11 MED ORDER — LORAZEPAM 1 MG PO TABS
1.0000 mg | ORAL_TABLET | Freq: Three times a day (TID) | ORAL | Status: DC | PRN
  Administered 2013-04-11: 1 mg via ORAL
  Filled 2013-04-11: qty 1

## 2013-04-11 MED ORDER — IBUPROFEN 400 MG PO TABS
600.0000 mg | ORAL_TABLET | Freq: Three times a day (TID) | ORAL | Status: DC | PRN
  Administered 2013-04-11: 600 mg via ORAL
  Filled 2013-04-11 (×2): qty 1

## 2013-04-11 NOTE — ED Notes (Signed)
Pt c/o nasal congestion-- requesting something for congestion

## 2013-04-11 NOTE — BH Assessment (Signed)
[facility]. [facility]

## 2013-04-11 NOTE — ED Notes (Signed)
Dr Blinda LeatherwoodPollina informed that [facility].

## 2013-04-11 NOTE — BH Assessment (Signed)
Patient ran by Serena ColonelAggie Nwoko, NP who is agreeable to accepting patient. However, she would like patient also ran by Dr. Dub MikesLugo before making a final decision about taking this patient

## 2013-04-11 NOTE — BH Assessment (Signed)
[facility] by Serena ColonelAggie Nwoko, NP. The attending physician is Dr. Geoffery LyonsIrving Lugo. The room assignment is 307-2. Writer has attempted to contact patient's nurse to update her on patient's disposition; no answer. Will attempt to call again shortly. Patient is voluntary and will need to be transported via [facility]. Patient's nurse will also need to complete the appropriate support paperwork.   Writer also tried to contact Dr. Blinda LeatherwoodPollina [phone], however; he is currently with a critical patient. Writer also try to call him back.

## 2013-04-11 NOTE — Tx Team (Addendum)
Initial Interdisciplinary Treatment Plan  PATIENT STRENGTHS: (choose at least two) Ability for insight Active sense of humor Average or above average intelligence Capable of independent living Communication skills General fund of knowledge Physical Health Religious Affiliation Special hobby/interest Work skills  PATIENT STRESSORS: Financial difficulties Legal issue Substance abuse   PROBLEM LIST: Problem List/Patient Goals Date to be addressed Date deferred Reason deferred Estimated date of resolution  " I just wanna detox and get into a [name] program" 04/11/13     Depression 04/11/13     Substance abuse 04/11/13     Increased risk for suicide 04/11/13                                    DISCHARGE CRITERIA:  Ability to meet basic life and health needs Adequate post-discharge living arrangements Improved stabilization in mood, thinking, and/or behavior Motivation to continue treatment in a less acute level of care Need for constant or close observation no longer present Reduction of life-threatening or endangering symptoms to within safe limits Safe-care adequate arrangements made Verbal commitment to aftercare and medication compliance Withdrawal symptoms are absent or subacute and managed without 24-hour nursing intervention  PRELIMINARY DISCHARGE PLAN: Attend 12-step recovery group Outpatient therapy Participate in family therapy Placement in alternative living arrangements  PATIENT/FAMIILY INVOLVEMENT: This treatment plan has been presented to and reviewed with the patient, Kathreen Devoidaul Cousins, and/or family member.  The patient and family have been given the opportunity to ask questions and make suggestions.  Fransico MichaelBrooks, Tyrah Broers [facility] 04/11/2013, 8:57 PM

## 2013-04-11 NOTE — ED Notes (Signed)
Pt. Is noted out of the shower. He is informed that we are currently awaiting transportation.

## 2013-04-11 NOTE — Progress Notes (Signed)
Patient ID: Adam [name], male   DOB: 1986/01/07, 28 y.o.   MRN: [id number]  Pt was pleasant and cooperative during the adm process. Pt stated he'd like to be accepted into [name] again. Stated he did 28 days, asked for and received an extension, but left on the first day of the extension. Stated they didn't give him his certificate, and he believes they should have. Pt stated he started back using last week after a trip to [place] his girlfriend. Stated he went to meet her family, but felt "like a fool" because he wasn't drinking along with some of the other guests. Report states pt went to the ED 3 times this month and was discharged. Per report pt reported SI to take meds. Though pt stated he had cocaine yesterday, his UDS was negative. Pt didn't seem to be taking the adm process serious. He continued to change answers, stating "I'm just joking". Pt denied SI, HI, A/v during the adm process.
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
## 2013-04-11 NOTE — ED Notes (Signed)
Pt. belongings given to [employer] driver.

## 2013-04-11 NOTE — ED Notes (Signed)
Placed patient in paper scrubs and bagged all belongings

## 2013-04-11 NOTE — ED Notes (Signed)
Pt. Is currently in the shower. 
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
## 2013-04-11 NOTE — ED Notes (Signed)
Patient arrives with complaint of Suicidal Intent. Explains that he has consumed a large quantity of alcohol over the past 4 days, is feeling very depressed, and also took some pills this evening to try to help end it all. Denies cocaine use this evening. Unable to quantify alcohol, but says he remembers 7x shots of whiskey and 9x 40oz Malt beverages. Explains that he doesn't know exactly what the pills were, says that he took more than 2 of them, and they may have been percocet.

## 2013-04-11 NOTE — Progress Notes (Signed)
As per [place], this staff provided support paperwork [name], witnessed signature and [facility].  Pt is currently preparing for transport.

## 2013-04-11 NOTE — BH Assessment (Signed)
[name] st[facility] that patient is declined due to reported suicidal ideations. Sts he must be without suicidal ideations for at least 24 hrs.

## 2013-04-11 NOTE — ED Notes (Signed)
Pt. Given [place] sand which. Pt. Is being escorted by [employer] driver.

## 2013-04-12 ENCOUNTER — Encounter (HOSPITAL_COMMUNITY): Payer: Self-pay | Admitting: Psychiatry

## 2013-04-12 DIAGNOSIS — F1020 Alcohol dependence, uncomplicated: Principal | ICD-10-CM

## 2013-04-12 DIAGNOSIS — F329 Major depressive disorder, single episode, unspecified: Secondary | ICD-10-CM

## 2013-04-12 DIAGNOSIS — F1994 Other psychoactive substance use, unspecified with psychoactive substance-induced mood disorder: Secondary | ICD-10-CM

## 2013-04-12 DIAGNOSIS — F1410 Cocaine abuse, uncomplicated: Secondary | ICD-10-CM

## 2013-04-12 NOTE — [facility] Suicide Risk Assessment (Signed)
[facility] INPATIENT:  Family/Significant Other Suicide Prevention Education  Suicide Prevention Education:  Education Completed; Andy GaussBridgett Carrington - girlfriend [phone]),  (name of family member/significant other) has been identified by the patient as the family member/significant other with whom the patient will be residing, and identified as the person(s) who will aid the patient in the event of a mental health crisis (suicidal ideations/suicide attempt).  With written consent from the patient, the family member/significant other has been provided the following suicide prevention education, prior to the and/or following the discharge of the patient.  The suicide prevention education provided includes the following:  Suicide risk factors  Suicide prevention and interventions  National Suicide Hotline telephone number  [facility] assessment telephone number  [facility] Emergency Assistance 911  [facility] and/or Residential Mobile Crisis Unit telephone number  Request made of family/significant other to:  Remove weapons (e.g., guns, rifles, knives), all items previously/currently identified as safety concern.    Remove drugs/medications (over-the-counter, prescriptions, illicit drugs), all items previously/currently identified as a safety concern.  The family member/significant other verbalizes understanding of the suicide prevention education information provided.  The family member/significant other agrees to remove the items of safety concern listed above.  Carmina MillerHorton, Isair Inabinet Nicole 04/12/2013, 2:35 PM

## 2013-04-12 NOTE — Progress Notes (Signed)
D: pt is needy and wanting a lot of attention. Pt keeps asking for Gatorade and became irritable when not given it continually. Denies si/hi/avh. Denies pain. Pt is med compliant and coopertive  A: scheduled medications given. Support and encouragement offered. q 15 min safety checks R: pt remains safe on unit. No signs of distress or further complaints at this time

## 2013-04-12 NOTE — Clinical Social Work Note (Signed)
CSW contacted [name] to see if pt could come back there for treatment.  CSW was informed by Trey PaulaJeff that pt isn't eligible to come back until March and [facility] and [facility] with pain med prescriptions.  CSW informed pt of this info.  [facility] but will let CSW know at a later time.   Reyes IvanChelsea Horton, LCSW 04/12/2013  1:26 PM

## 2013-04-12 NOTE — [facility] Counselor (Signed)
Adult Psychosocial Assessment Update Interdisciplinary Team  Previous Behavior Health Hospital admissions/discharges:  Admissions Discharges  Date: 02/26/13 Date: 02/27/13  Date: 02/20/13 Date: 02/22/13  Date: 01/06/13 Date: 01/10/13  Date: 12/25/12 Date: 12/28/12  Date: 12/17/12 Date: 12/20/12  Date: 05/31/12 Date: 06/02/12    Changes since the last Psychosocial Assessment (including adherence to outpatient mental health and/or substance abuse treatment, situational issues contributing to decompensation and/or relapse). Pt recently relapsed on alcohol and cocaine.  Pt reports drinking 8-9 40 oz daily as well as liquor.  Pt completed [name] inpatient program in November.  Pt states that he relapsed after 1 day from discharge from there.  Pt reports current stressors are dealing with his mother in the hospital, his dad being sick and getting into an argument with his girlfriend.  Pt has multiple pending charges for trafficking roxycodone, shoplifting as well as others he can't remember.  Pt has been to [facility] and reports completing the program 3-4 years ago.  [facility]               Discharge Plan 1. Will you be returning to the same living situation after discharge?   Yes: X Pt can return home to girlfriend's home upon d/c.  No:      If no, what is your plan?           2. Would you like a referral for services when you are discharged? Yes:  X   If yes, for what services? [facility].    No:              Summary and Recommendations (to be completed by the evaluator) Patient is a [age] year old African American Male with a diagnosis of Major Depression, Recurrent severe and Alcohol dependence; Cocaine Abuse.  [place].  Patient will benefit from crisis stabilization, medication evaluation, group therapy and  psycho education in addition to case management for discharge planning.                         Signature:  Adam [name], Adam [name], 04/12/2013 8:33 AM

## 2013-04-12 NOTE — [facility] Group Notes (Signed)
[facility] LCSW Group Therapy  04/12/2013  1:15 PM   Type of Therapy:  Group Therapy  Participation Level:  Active  Participation Quality:  Attentive and Supportive  Affect:  Depressed and Flat  Cognitive:  Alert and Oriented  Insight:  Developing/Improving and Engaged  Engagement in Therapy:  Developing/Improving and Engaged  Modes of Intervention:  Activity, Clarification, Confrontation, Discussion, Education, Exploration, Limit-setting, Orientation, Problem-solving, Rapport Building, Reality Testing, Socialization and Support  Summary of Progress/Problems: Patient was attentive and engaged with speaker from [employer].  Patient was attentive to speaker while they shared their story of dealing with mental health and overcoming it.  Patient expressed interest in their programs and services and received information on their agency.  Patient processed ways they can relate to the speaker.     Krisha Beegle Horton, LCSW 04/12/2013 2:26 PM   

## 2013-04-12 NOTE — Progress Notes (Signed)
The focus of this group is to educate the patient on the purpose and policies of crisis stabilization and provide a format to answer questions about their admission.  The group details unit policies and expectations of patients while admitted. ° °Pt did not attend. °

## 2013-04-12 NOTE — [facility] Suicide Risk Assessment (Signed)
Suicide Risk Assessment  Admission Assessment     Nursing information obtained from:    Demographic factors:  Male;Low socioeconomic status;Unemployed Current Mental Status:  NA Loss Factors:  Financial problems / change in socioeconomic status;Legal issues Historical Factors:  Family history of mental illness or substance abuse Risk Reduction Factors:  Sense of responsibility to family;Religious beliefs about death;Living with another person, especially a relative Total Time spent with patient: 45 minutes  CLINICAL FACTORS:   Depression:   Comorbid alcohol abuse/dependence Alcohol/Substance Abuse/Dependencies  Psychiatric Specialty Exam:     Blood pressure 134/72, pulse 79, temperature 98 F (36.7 C), temperature source Oral, resp. rate 17, height 5\' 10"  (1.778 m), weight 85.276 kg (188 lb).Body mass index is 26.98 kg/(m^2).  General Appearance: Disheveled  Eye SolicitorContact::  Fair  Speech:  Clear and Coherent  Volume:  fluctuates  Mood:  Anxious and worried  Affect:  anxious, worried  Thought Process:  Coherent and Goal Directed  Orientation:  Full (Time, Place, and Person)  Thought Content:  symptoms, worries, concerns  Suicidal Thoughts:  No  Homicidal Thoughts:  No  Memory:  Immediate;   Fair Recent;   Fair Remote;   Fair  Judgement:  Fair  Insight:  Shallow  Psychomotor Activity:  Restlessness  Concentration:  Fair  Recall:  FiservFair  Fund of Knowledge:Fair  Language: Fair  Akathisia:  No  Handed:    AIMS (if indicated):     Assets:  Desire for Improvement  Sleep:  Number of Hours: 6.25   Musculoskeletal: Strength & Muscle Tone: within normal limits Gait & Station: normal Patient leans: N/A  COGNITIVE FEATURES THAT CONTRIBUTE TO RISK:  Closed-mindedness Polarized thinking Thought constriction (tunnel vision)    SUICIDE RISK:   Moderate:  Frequent suicidal ideation with limited intensity, and duration, some specificity in terms of plans, no associated intent,  good self-control, limited dysphoria/symptomatology, some risk factors present, and identifiable protective factors, including available and accessible social support.  PLAN OF CARE: Supportive approach/coping skills/relapse prevention                              Identify detox needs                              Reassess and address the co morbidities  I certify that inpatient services furnished can reasonably be expected to improve the patient's condition.  Marilouise Densmore A 04/12/2013, 3:53 PM
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
## 2013-04-12 NOTE — Progress Notes (Signed)
D: Patient denies SI/HI and A/V hallucinations; patient reports sleep is well; reports appetite is good; reports energy level is normal ; reports ability to pay attention is good; rates depression as 1/10; rates hopelessness 1/10;patient [facility] " [facility]"  A: Monitored q 15 minutes; patient encouraged to attend groups; patient educated about medications; patient given medications per physician orders; patient encouraged to express feelings and/or concerns  R: Patient is superficial and animated; patient is cooperative; patient forwards little information;  patient's interaction with staff and peers is appropriate; patient was able to set goal to talk with staff 1:1 when having feelings of SI; patient is taking medications as prescribed and tolerating medications; patient is attending all groups

## 2013-04-12 NOTE — Progress Notes (Signed)
Adult Psychoeducational Group Note  Date:  04/12/2013 Time:  10:11 PM  Group Topic/Focus:  Wrap-Up Group:   The focus of this group is to help patients review their daily goal of treatment and discuss progress on daily workbooks.  Participation Level:  Active  Participation Quality:  Appropriate  Affect:  Appropriate  Cognitive:  Alert  Insight: Appropriate  Engagement in Group:  Engaged  Modes of Intervention:  Discussion  Additional Comments:  Patient engaged in wrap up group. Patient shared plans after discharge.  Elvera BickerSquire, Tammi Boulier 04/12/2013, 10:11 PM

## 2013-04-12 NOTE — Progress Notes (Signed)
Recreation Therapy Notes  Date: 01.29.2015 Time: 2:45pm Location: [address]   Group Topic: Leisure Education  Goal Area(s) Addresses:  Patient will identify positive leisure activities.  Patient will identify one positive benefit of participation in leisure activities.   Behavioral Response: Did not attend.    Marykay Lexenise L Adreanna Fickel, LRT/CTRS  Titiana Severa L 04/12/2013 5:01 PM

## 2013-04-13 MED ORDER — TRAZODONE HCL 50 MG PO TABS: 50.0000 mg | ORAL_TABLET | Freq: Every evening | ORAL | Status: DC | PRN

## 2013-04-13 NOTE — Progress Notes (Signed)
Patient discharged per physician order; patient denies SI/HI and A/V hallucinations; patient denies withdrawal symptoms; patient received samples, prescriptions, and copy of AVS after it was reviewed; patient had no other questions or concerns at this time; patient verbalized that he received all belongings; patient left the unit ambulatory
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
## 2013-04-13 NOTE — Progress Notes (Signed)
Adult Psychoeducational Group Note  Date:  04/13/2013 Time:  6:40 PM  Group Topic/Focus:  Early Warning Signs:   The focus of this group is to help patients identify signs or symptoms they exhibit before slipping into an unhealthy state or crisis.  Participation Level:  Active  Participation Quality:  Appropriate, Sharing and Supportive  Affect:  Appropriate  Cognitive:  Appropriate  Insight: Appropriate  Engagement in Group:  Engaged and Supportive  Modes of Intervention:  Discussion, Education, Socialization and Support  Additional Comments:  Pts discussed early warning signs for relapse based on behavior, attitude, thought, and feeling changes. After identifying these warning signs pts came up with one positive coping skill they could do when they see these unhealthy warnings signs.  Pt came to group and participated with others. Pt left group early to speak with the doctor.   Caswell CorwinOwen, Jaison Petraglia C 04/13/2013, 6:40 PM

## 2013-04-13 NOTE — [facility] Group Notes (Signed)
[facility]   04/13/2013 8:45 AM  Participation Quality:  Alert, Appropriate and Oriented  Mood/Affect:  Calm  Depression Rating:  0  Anxiety Rating:  0  Thoughts of Suicide:  Pt denies SI/HI  Will you contract for safety?   Yes  Current AVH:  Pt denies  Plan for Discharge/Comments:  Pt attended discharge planning group and actively participated in group.  CSW provided pt with today's workbook.  Pt reports feeling ready to d/c today.  Pt [place] now [place] with a cousin who he reports is a positive, sober support.  [facility].  CSW will secure pt's follow up in [place] prior to d/c.  No further needs voiced by pt at this time.    Transportation Means: Pt reports access to transportation - provided pt with a bus pass  Supports: No supports mentioned at this time  Reyes IvanChelsea Horton, LCSW 04/13/2013 9:56 AM
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
## 2013-04-13 NOTE — Tx Team (Signed)
Interdisciplinary Treatment Plan Update (Adult)  Date: 04/13/2013  Time Reviewed:  9:45 AM  Progress in Treatment: Attending groups: Yes Participating in groups:  Yes Taking medication as prescribed:  Yes Tolerating medication:  Yes Family/Significant othe contact made: Yes, with pt's girlfriend Patient understands diagnosis:  Yes Discussing patient identified problems/goals with staff:  Yes Medical problems stabilized or resolved:  Yes Denies suicidal/homicidal ideation: Yes Issues/concerns per patient self-inventory:  Yes Other:  New problem(s) identified: N/A  Discharge Plan or Barriers: Pt will follow up at at [facility] [place], [place] for medication management and therapy.    Reason for Continuation of Hospitalization: Stable to d/c today  Comments: N/A  Estimated length of stay: D/C today  For review of initial/current patient goals, please see plan of care.  Attendees: Patient:     Family:     Physician:  Dr. Dub MikesLugo 04/13/2013 10:45 AM   Nursing:   Leighton ParodyBritney Tyson, RN 04/13/2013 10:45 AM   Clinical Social Worker:  Reyes Ivanhelsea Horton, LCSW 04/13/2013 10:45 AM   Other: Onnie BoerJennifer Clark, RN case manager 04/13/2013 10:45 AM   Other:  Trula SladeHeather Smart, LCSWA 04/13/2013 10:45 AM   Other:  Serena ColonelAggie Nwoko, NP 04/13/2013 10:46 AM   Other:  Manuela SchwartzJennifer Pritchett, RN 04/13/2013 10:46 AM   Other:    Other:    Other:    Other:    Other:    Other:     Scribe for Treatment Team:   Carmina MillerHorton, Eowyn Tabone Nicole, 04/13/2013 , 10:45 AM

## 2013-04-13 NOTE — Progress Notes (Addendum)
[facility] :  Will you be returning to the same living situation after discharge: Yes,  pt [place], do you have transportation home?:Yes,  provided pt with bus passes Do you have the ability to pay for your medications:Yes,  access to meds  Release of information consent forms completed and in the chart;  Patient's signature needed at discharge.  Patient to Follow up at: Follow-up Information   Follow up with [facility] On 04/17/2013. (Appointment scheduled on this date at 12:20 pm for hospital discharge appointment.  They will than schedule you for appts for medication management and therapy.    )    Contact information:   [address] [place] [id number] Phone: [phone] Fax: [phone]      Patient denies SI/HI:   Yes,  denies SI/HI    Safety Planning and Suicide Prevention discussed:  Yes,  discussed with pt and pt's girlfriend.  See suicide prevention education note.   Carmina MillerHorton, Ranette Luckadoo Nicole 04/13/2013, 10:52 AM

## 2013-04-14 ENCOUNTER — Emergency Department (HOSPITAL_COMMUNITY)
Admission: EM | Admit: 2013-04-14 | Discharge: 2013-04-14 | Disposition: A | Discharge status: Home / Self Care | Payer: Self-pay | Attending: Emergency Medicine | Admitting: Emergency Medicine

## 2013-04-14 ENCOUNTER — Encounter (HOSPITAL_COMMUNITY): Payer: Self-pay | Admitting: Emergency Medicine

## 2013-04-14 DIAGNOSIS — F3289 Other specified depressive episodes: Secondary | ICD-10-CM | POA: Insufficient documentation

## 2013-04-14 DIAGNOSIS — F329 Major depressive disorder, single episode, unspecified: Secondary | ICD-10-CM | POA: Insufficient documentation

## 2013-04-14 DIAGNOSIS — F1410 Cocaine abuse, uncomplicated: Secondary | ICD-10-CM | POA: Insufficient documentation

## 2013-04-14 DIAGNOSIS — F17200 Nicotine dependence, unspecified, uncomplicated: Secondary | ICD-10-CM | POA: Insufficient documentation

## 2013-04-14 DIAGNOSIS — F411 Generalized anxiety disorder: Secondary | ICD-10-CM | POA: Insufficient documentation

## 2013-04-14 DIAGNOSIS — F1910 Other psychoactive substance abuse, uncomplicated: Secondary | ICD-10-CM

## 2013-04-14 DIAGNOSIS — F1994 Other psychoactive substance use, unspecified with psychoactive substance-induced mood disorder: Secondary | ICD-10-CM | POA: Insufficient documentation

## 2013-04-14 LAB — COMPREHENSIVE METABOLIC PANEL
ALK PHOS: 64 U/L (ref 39–117)
ALT: 16 U/L (ref 0–53)
AST: 16 U/L (ref 0–37)
Albumin: 3.8 g/dL (ref 3.5–5.2)
BUN: 5 mg/dL — ABNORMAL LOW (ref 6–23)
CALCIUM: 8.8 mg/dL (ref 8.4–10.5)
CO2: 24 mEq/L (ref 19–32)
Chloride: 100 mEq/L (ref 96–112)
Creatinine, Ser: 0.82 mg/dL (ref 0.50–1.35)
GFR calc Af Amer: >90 mL/min (ref >90)
GFR calc non Af Amer: >90 mL/min (ref >90)
GLUCOSE: 81 mg/dL (ref 70–99)
POTASSIUM: 3.6 meq/L — AB (ref 3.7–5.3)
Sodium: 142 mEq/L (ref 137–147)
Total Bilirubin: 0.4 mg/dL (ref 0.3–1.2)
Total Protein: 7.7 g/dL (ref 6.0–8.3)

## 2013-04-14 LAB — CBC
HCT: 40.9 % (ref 39.0–52.0)
HEMOGLOBIN: 15 g/dL (ref 13.0–17.0)
MCH: 31.2 pg (ref 26.0–34.0)
MCHC: 36.7 g/dL — ABNORMAL HIGH (ref 30.0–36.0)
MCV: 85 fL (ref 78.0–100.0)
Platelets: 250 10*3/uL (ref 150–400)
RBC: 4.81 MIL/uL (ref 4.22–5.81)
RDW: 14.3 % (ref 11.5–15.5)
WBC: 8.8 10*3/uL (ref 4.0–10.5)

## 2013-04-14 LAB — RAPID URINE DRUG SCREEN, HOSP PERFORMED
Amphetamines: NOT DETECTED
Barbiturates: NOT DETECTED
Benzodiazepines: POSITIVE — AB
COCAINE: POSITIVE — AB
OPIATES: NOT DETECTED
Tetrahydrocannabinol: NOT DETECTED

## 2013-04-14 LAB — ETHANOL: Alcohol, Ethyl (B): 11 mg/dL (ref 0–11)

## 2013-04-14 LAB — ACETAMINOPHEN LEVEL: Acetaminophen (Tylenol), Serum: <15 ug/mL (ref 10–30)

## 2013-04-14 LAB — SALICYLATE LEVEL: Salicylate Lvl: <2 mg/dL — ABNORMAL LOW (ref 2.8–20.0)

## 2013-04-14 NOTE — Discharge Instructions (Signed)
Do not use cocaine, alcohol, or other drugs - see resource guide for community resources. Also follow up with [facility] in the next couple days.   Return to ER if worse, new symptoms, other concern.   Mood Disorders Mood disorders are conditions that affect the way a person feels emotionally. The main mood disorders include:  Depression.  Bipolar disorder.  Dysthymia. Dysthymia is a mild, lasting (chronic) depression. Symptoms of dysthymia are similar to depression, but not as severe.  Cyclothymia. Cyclothymia includes mood swings, but the highs and lows are not as severe as they are in bipolar disorder. Symptoms of cyclothymia are similar to those of bipolar disorder, but less extreme. CAUSES  Mood disorders are probably caused by a combination of factors. People with mood disorders seem to have physical and chemical changes in their brains. Mood disorders run in families, so there may be genetic causes. Severe trauma or stressful life events may also increase the risk of mood disorders.  SYMPTOMS  Symptoms of mood disorders depend on the specific type of condition. Depression symptoms include:  Feeling sad, worthless, or hopeless.  Negative thoughts.  Inability to enjoy one's usual activities.  Low energy.  Sleeping too much or too little.  Appetite changes.  Crying.  Concentration problems.  Thoughts of harming oneself. Bipolar disorder symptoms include:  Periods of depression (see above symptoms).  Mood swings, from sadness and depression, to abnormal elation and excitement.  Periods of mania:  Racing thoughts.  Fast speech.  Poor judgment, and careless, dangerous choices.  Decreased need for sleep.  Risky behavior.  Difficulty concentrating.  Irritability.  Increased energy.  Increased sex drive. DIAGNOSIS  There are no blood tests or X-rays that can confirm a mood disorder. However, your caregiver may choose to run some tests to make sure  that there is not another physical cause for your symptoms. A mood disorder is usually diagnosed after an in-depth interview with a caregiver. TREATMENT  Mood disorders can be treated with one or more of the following:  Medicine. This may include antidepressants, mood-stabilizers, or anti-psychotics.  Psychotherapy (talk therapy).  Cognitive behavioral therapy. You are taught to recognize negative thoughts and behavior patterns, and replace them with healthy thoughts and behaviors.  Electroconvulsive therapy. For very severe cases of deep depression, a series of treatments in which an electrical current is applied to the brain.  Vagus nerve stimulation. A pulse of electricity is applied to a portion of the brain.  Transcranial magnetic stimulation. Powerful magnets are placed on the head that produce electrical currents.  Hospitalization. In severe situations, or when someone is having serious thoughts of harming him or herself, hospitalization may be necessary in order to keep the person safe. This is also done to quickly start and monitor treatment. HOME CARE INSTRUCTIONS   Take your medicine exactly as directed.  Attend all of your therapy sessions.  Try to eat regular, healthy meals.  Exercise daily. Exercise may improve mood symptoms.  Get good sleep.  Do not drink alcohol or use pot or other drugs. These can worsen mood symptoms and cause anxiety and psychosis.  Tell your caregiver if you develop any side effects, such as feeling sick to your stomach (nauseous), dry mouth, dizziness, constipation, drowsiness, tremor, weight gain, or sexual symptoms. He or she may suggest things you can do to improve symptoms.  Learn ways to cope with the stress of having a chronic illness. This includes yoga, meditation, tai chi, or participating in a support  group.  Drink enough water to keep your urine clear or pale yellow. Eat a high-fiber diet. These habits may help you avoid constipation  from your medicine. SEEK IMMEDIATE MEDICAL CARE IF:  Your mood worsens.  You have thoughts of hurting yourself or others.  You cannot care for yourself.  You develop the sensation of hearing or seeing something that is not actually present (auditory or visual hallucinations).  You develop abnormal thoughts. Document Released: 12/27/2008 Document Revised: 05/24/2011 Document Reviewed: 12/27/2008 [facility] Patient Information 2014 [place], [place].    Substance Abuse Your exam indicates that you have a problem with substance abuse. Substance abuse is the misuse of alcohol or drugs that causes problems in family life, friendships, and work relationships. Substance abuse is the most important cause of premature illness, disability, and death in our society. It is also the greatest threat to a person's mental and spiritual well being. Substance abuse can start out in an innocent way, such as social drinking or taking a little extra medication prescribed by your doctor. No one starts out with the intention of becoming an alcoholic or an addict. Substance abuse victims cannot control their use of alcohol or drugs. They may become intoxicated daily or go on weekend binges. Often there is a strong desire to quit, but attempts to stop using often fail. Encounters with law enforcement or conflicts with family members, friends, and work associates are signs of a potential problem. Recovery is always possible, although the craving for some drugs makes it difficult to quit without assistance. Many treatment programs are available to help people stop abusing alcohol or drugs. The first step in treatment is to admit you have a problem. This is a major hurdle because denial is a powerful force with substance abuse. Alcoholics Anonymous, Narcotics Anonymous, Cocaine Anonymous, and other recovery groups and programs can be very useful in helping people to quit. If you do not feel okay about your drug or alcohol use  and if it is causing you trouble, we want to encourage you to talk about it with your doctor or with someone from a recovery group who can help you. You could also call the [employer] on Drug Abuse at 1-800-662-HELP. It is up to you to take the first step. AL-ANON and ALA-TEEN are support groups for friends and family members of an alcohol or drug dependent person. The people who love and care for the alcoholic or addicted person often need help, too. For information about these organizations, check your phone directory or call a local alcohol or drug treatment center. Document Released: 04/08/2004 Document Revised: 05/24/2011 Document Reviewed: 03/02/2008 [facility] Patient Information 2014 [place].    Emergency Department Resource Guide 1) Find a Doctor and Pay Out of Pocket Although you won't have to find out who is covered by your insurance plan, it is a good idea to ask around and get recommendations. You will then need to call the office and see if the doctor you have chosen will accept you as a new patient and what types of options they offer for patients who are self-pay. Some doctors offer discounts or will set up payment plans for their patients who do not have insurance, but you will need to ask so you aren't surprised when you get to your appointment.  2) Contact Your Local Health Department Not all health departments have doctors that can see patients for sick visits, but many do, so it is worth a call to see if yours  does. If you don't know where your local health department is, you can check in your phone book. The CDC also has a tool to help you locate your state's health department, and many state websites also have listings of all of their local health departments.  3) Find a [facility] If your illness is not likely to be very severe or complicated, you may want to try a walk in clinic. These are popping up all over the country in pharmacies, drugstores, and  shopping centers. They're usually staffed by nurse practitioners or physician assistants that have been trained to treat common illnesses and complaints. They're usually fairly quick and inexpensive. However, if you have serious medical issues or chronic medical problems, these are probably not your best option.  No Primary Care Doctor: - Call Health Connect at  [phone] - they can help you locate a primary care doctor that  accepts your insurance, provides certain services, etc. - Physician Referral Service- [phone]  Chronic Pain Problems: Organization         Address  Phone   Notes  [facility]  [phone] Patients need to be referred by their primary care doctor.   Medication Assistance: Organization         Address  Phone   Notes  [facility] Medication [facility] [address] [place], [place] [address] [phone] --Must be a resident of [facility] -- Must have NO insurance coverage whatsoever (no Medicaid/ Medicare, etc.) -- The pt. MUST have a primary care doctor that directs their care regularly and follows them in the community   MedAssist  [phone]   [employer]  [phone]    Agencies that provide inexpensive medical care: Organization         Address  Phone   Notes  [place]  [phone]   [facility]    [phone]   [facility], [place] [address] [phone]   [place] [address], [place] [phone]   Planned Parenthood    [phone]   [facility]    [phone]   [place] and [place] [address], [place] Phone:  [phone], Fax:  [phone] Hours of Operation:  9 am - 6 pm, M-F.  Also accepts Medicaid/Medicare and self-pay.  [facility] for [place], [address], [place] Phone: [phone],  Fax: [phone]. Hours of Operation:  8:30 am - 5:30 pm, M-F.  Also accepts Medicaid and self-pay.  [facility] [place] [address], [place] Phone: [phone]   [place], [place], [place] [phone], Ext. [phone] Mondays & Thursdays: 7-9 AM.  First 15 patients are seen on a first come, first serve basis.    [place] Providers:  Organization         Address  Phone   Notes  [facility] [address], [place] [phone] Also accepts self-pay patients.  [place], [place]  [phone]   [place], [address], [place] [phone]   [facility] [address] [place] [phone]   Lucianne Lei [address], [place]   [phone]  Only accepts [place] patients after they have their name applied to their card.   Self-Pay (no insurance) in [facility]:  Organization         Address  Phone   Notes  Sickle Cell Patients, [facility] [place] [phone]   [facility] [place] [phone]   [facility] [place]  [place], [place], [place] [phone]   [facility]/Dr. Osei-Bonsu  [address] [place] or [address], [place] [phone] Phone number for both [place] and [place] locations is the same.  Urgent Medical and [facility] [address], [place] [phone]   [facility] [address], [place] or [address] [phone]   [facility] [address] [place] [phone], phone; [phone], fax Sees patients 1st and 3rd Saturday of every month.  Must not qualify for public or private  insurance (i.e. Medicaid, Medicare, [place] Health Choice, Veterans' Benefits)  Household income should be no more than 200% of the poverty level The clinic cannot treat you if you are pregnant or think you are pregnant  Sexually transmitted diseases are not treated at the clinic.    Dental Care: Organization         Address  Phone  Notes  [facility] Department of [facility] [place] [phone] Accepts children up to age 65 who are enrolled in [place] or [place]; pregnant women with a Medicaid card; and children who have applied for Medicaid or [place] Health Choice, but were declined, whose parents can pay a reduced fee at time of service.  [facility] Department of [facility]  [address], [place] [phone] Accepts children up to age 63 who are enrolled in [place] or [place]; pregnant women with a Medicaid card; and children who have applied for Medicaid or [place] Health Choice, but were declined, whose parents can pay a reduced fee at time of service.  [facility] Adult [facility] Access PROGRAM  [place] [phone] Patients are seen by appointment only. Walk-ins are not accepted. [place] will see patients 60 years of age and older. Monday - Tuesday (8am-5pm) Most Wednesdays (8:30-5pm) $30 per visit, cash only  [facility] Adult Dental Access PROGRAM  [address], [facility] [phone] Patients are seen by appointment only. Walk-ins are not accepted. [place] will see patients 67 years of age and older. One Wednesday Evening (Monthly: Volunteer Based).  $30 per visit, cash only  [place]  [phone] for adults; Children under age 95, call Graduate Pediatric Dentistry at [phone]. Children aged 9-14, please call [phone] to request a pediatric application.  Dental services are provided in all areas of dental care  including fillings, crowns and bridges, complete and partial dentures, implants, gum treatment, root canals, and extractions. Preventive care is also provided. Treatment is provided to both adults and children. Patients are selected via a lottery and there is often a waiting list.   [facility] [address], [place]  [phone] [email or web address], [place], [place] [phone], Ext. [phone] Second and Fourth Thursday of each month, opens at 6:30 AM; Clinic  ends at 9 AM.  Patients are seen on a first-come first-served basis, and a limited number are seen during each clinic.   [facility]  [address] [place], [place] [phone]   Eligibility Requirements You must have lived in [place], [place], or [place] counties for at least the last three months.   You cannot be eligible for state or federal sponsored [employer], including [employer], [place], or [employer].   You generally cannot be eligible for healthcare insurance through your employer.    How to apply: Eligibility screenings are held every Tuesday and Wednesday afternoon from 1:00 pm until 4:00 pm. You do not need an appointment for the interview!  [facility] [address], [place], [employer]   [place]  [place] Department  [place]  [phone]    Behavioral Health Resources in the Community: Intensive Outpatient Programs Organization         Address  Phone  Notes  [place]. [address], [place], [place] [phone]   [facility] Outpatient [address] [place], [place]   ADS: Alcohol & Drug Sv[facility] [address] [place], [place]   [place] [address],  [place], [place] or [phone]     Substance Abuse Resources Organization         Address  Phone  Notes  Alcohol and Drug Services  [phone]   [place]  [phone]   [facility] [place]   Chinita Pester  [phone]   Residential & Outpatient Substance Abuse Program  [phone]   Psychological Services Organization         Address  Phone  Notes  [facility]  [place]  [phone]   [place] [address], [place] or [phone]    Mobile Crisis Teams Organization         Address  Phone  Notes  [facility], Mobile Crisis Care Unit  [phone]   [facility]  [address] [place], [place]   [place] Levels [address], [place] [phone]    Self-Help/Support Groups Organization         Address  Phone             Notes  [place]. of [place] - variety of support groups  [place] Call for more information  Narcotics Anonymous (NA), [facility] [address], [employer] [place]  2 meetings at this location   Special educational needs teacher         Address  Phone  Notes  ASAP Residential Treatment [place],    [place]  [phone]   [facility]  [address], [place] [phone], [place], [place]   [place], [place] [phone] Admissions: 8am-3pm M-F  Incentives Substance [place] [address],    [place], [place] [phone]   [facility] [address] Jadene Pierini [place], [place]   [facility] [address],  [place], [place]   [facility] Programs - Intensive Outpatient [address] Kristeen [address], [place], [place]   [facility] ([place] [address] [place], [place] or [phone]  Residential Treatment  Services (RTS) [address], [place], [place] Accepts Medicaid  [facility] [place] [address],  [place] [phone] Substance Abuse/Addiction Treatment   [facility] Organization         Address  Phone  Notes  [facility]  [phone]   Domenic Schwab, PhD [address] [place]lis [place], [place]   [phone] or [phone]   [place], [place] [phone]   [place] [address], [place], [place] [phone] Insurance/Medicaid/sponsorship through [facility] and Families [address], [address] [place]                                    [place], [place] [phone] [place] [address], [place] [phone]    Dr. Adele Schilder  [phone]   [facility] [place] Dept. 1) [address] [place] 2) [place] 3)  [place] [address], [place] [phone]  [phone]   [place] [phone] or [phone] (After Hours)
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
## 2013-04-14 NOTE — ED Notes (Signed)
Pt reports here for detox and suicidal thoughts. Pt reports last use of drugs was this morning. Pt reports that he just got out of behavioral health and relapsed.

## 2013-04-18 ENCOUNTER — Emergency Department (HOSPITAL_COMMUNITY): Payer: Self-pay

## 2013-04-18 ENCOUNTER — Encounter (HOSPITAL_COMMUNITY): Payer: Self-pay | Admitting: Emergency Medicine

## 2013-04-18 ENCOUNTER — Emergency Department (HOSPITAL_COMMUNITY)
Admission: EM | Admit: 2013-04-18 | Discharge: 2013-04-18 | Disposition: A | Discharge status: Home / Self Care | Payer: Self-pay | Attending: Emergency Medicine | Admitting: Emergency Medicine

## 2013-04-18 DIAGNOSIS — F10929 Alcohol use, unspecified with intoxication, unspecified: Secondary | ICD-10-CM

## 2013-04-18 DIAGNOSIS — Z87828 Personal history of other (healed) physical injury and trauma: Secondary | ICD-10-CM | POA: Insufficient documentation

## 2013-04-18 DIAGNOSIS — F1310 Sedative, hypnotic or anxiolytic abuse, uncomplicated: Secondary | ICD-10-CM | POA: Insufficient documentation

## 2013-04-18 DIAGNOSIS — F17200 Nicotine dependence, unspecified, uncomplicated: Secondary | ICD-10-CM | POA: Insufficient documentation

## 2013-04-18 DIAGNOSIS — F1010 Alcohol abuse, uncomplicated: Secondary | ICD-10-CM | POA: Insufficient documentation

## 2013-04-18 DIAGNOSIS — R05 Cough: Secondary | ICD-10-CM | POA: Insufficient documentation

## 2013-04-18 DIAGNOSIS — R059 Cough, unspecified: Secondary | ICD-10-CM | POA: Insufficient documentation

## 2013-04-18 DIAGNOSIS — M795 Residual foreign body in soft tissue: Secondary | ICD-10-CM | POA: Insufficient documentation

## 2013-04-18 DIAGNOSIS — F1910 Other psychoactive substance abuse, uncomplicated: Secondary | ICD-10-CM

## 2013-04-18 DIAGNOSIS — F411 Generalized anxiety disorder: Secondary | ICD-10-CM | POA: Insufficient documentation

## 2013-04-18 DIAGNOSIS — F1994 Other psychoactive substance use, unspecified with psychoactive substance-induced mood disorder: Secondary | ICD-10-CM | POA: Insufficient documentation

## 2013-04-18 DIAGNOSIS — F1410 Cocaine abuse, uncomplicated: Secondary | ICD-10-CM | POA: Insufficient documentation

## 2013-04-18 LAB — CBC
HCT: 41.6 % (ref 39.0–52.0)
Hemoglobin: 14.7 g/dL (ref 13.0–17.0)
MCH: 30.3 pg (ref 26.0–34.0)
MCHC: 35.3 g/dL (ref 30.0–36.0)
MCV: 85.8 fL (ref 78.0–100.0)
PLATELETS: 231 10*3/uL (ref 150–400)
RBC: 4.85 MIL/uL (ref 4.22–5.81)
RDW: 14.7 % (ref 11.5–15.5)
WBC: 7.4 10*3/uL (ref 4.0–10.5)

## 2013-04-18 LAB — RAPID URINE DRUG SCREEN, HOSP PERFORMED
AMPHETAMINES: NOT DETECTED
BENZODIAZEPINES: POSITIVE — AB
Barbiturates: NOT DETECTED
Cocaine: POSITIVE — AB
OPIATES: NOT DETECTED
Tetrahydrocannabinol: NOT DETECTED

## 2013-04-18 LAB — COMPREHENSIVE METABOLIC PANEL
ALBUMIN: 3.8 g/dL (ref 3.5–5.2)
ALT: 16 U/L (ref 0–53)
AST: 20 U/L (ref 0–37)
Alkaline Phosphatase: 66 U/L (ref 39–117)
BUN: 6 mg/dL (ref 6–23)
CHLORIDE: 100 meq/L (ref 96–112)
CO2: 23 mEq/L (ref 19–32)
Calcium: 8.4 mg/dL (ref 8.4–10.5)
Creatinine, Ser: 0.74 mg/dL (ref 0.50–1.35)
GFR calc Af Amer: >90 mL/min (ref >90)
GFR calc non Af Amer: >90 mL/min (ref >90)
Glucose, Bld: 81 mg/dL (ref 70–99)
Potassium: 3.9 mEq/L (ref 3.7–5.3)
Sodium: 139 mEq/L (ref 137–147)
TOTAL PROTEIN: 7.3 g/dL (ref 6.0–8.3)
Total Bilirubin: 0.2 mg/dL — ABNORMAL LOW (ref 0.3–1.2)

## 2013-04-18 LAB — SALICYLATE LEVEL

## 2013-04-18 LAB — ACETAMINOPHEN LEVEL: Acetaminophen (Tylenol), Serum: <15 ug/mL (ref 10–30)

## 2013-04-18 LAB — TROPONIN I: Troponin I: <0.3 ng/mL (ref <0.30)

## 2013-04-18 LAB — ETHANOL: Alcohol, Ethyl (B): 159 mg/dL — ABNORMAL HIGH (ref 0–11)

## 2013-04-18 NOTE — ED Notes (Addendum)
Pt to ER via EMS for complaints of suicidal thoughts and overdosing on 10-12 Hydrocodone tablets and ETOH and crack, when asked how much crack pt used pt states "a shit ton"; pt arousable to voice but has slurred and garbled speech; pt states that he just got out of the hospital for detox a couple of days ago and was doing good but states that he has too much going on to remain sober; pt states that if he goes back out on the street he will attempt suicide again by overdose.

## 2013-04-18 NOTE — ED Notes (Signed)
Pt tolerating po fluids and food.

## 2013-04-18 NOTE — Progress Notes (Signed)
Patient Discharge Instructions:  After Visit Summary (AVS):   Faxed to:  04/18/13 Discharge Summary Note:   Faxed to:  04/18/13 Psychiatric Admission Assessment Note:   Faxed to:  04/18/13 Suicide Risk Assessment - Discharge Assessment:   Faxed to:  04/18/13 Faxed/Sent to the Next Level Care provider:  04/18/13 Faxed to [facility] @ [phone]  Jerelene ReddenSheena E [place], 04/18/2013, 3:49 PM
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
## 2013-04-18 NOTE — ED Notes (Signed)
Pt has 2 bags of belongings.  Pt was seen and searched by security.

## 2013-04-18 NOTE — Discharge Instructions (Signed)
Do not use cocaine, alcohol, and other drugs.  See resource guide provided to you that lists several community resources where you can get help for your substance abuse problem. For mood disorder, including feelings of anxiety and depression, follow up with [facility]/mental health in the next 1-2 days. Return to ER if worse, new symptoms, other concern.     Alcohol Problems Most adults who drink alcohol drink in moderation (not a lot) are at low risk for developing problems related to their drinking. However, all drinkers, including low-risk drinkers, should know about the health risks connected with drinking alcohol. RECOMMENDATIONS FOR LOW-RISK DRINKING  Drink in moderation. Moderate drinking is defined as follows:   Men - no more than 2 drinks per day.  Nonpregnant women - no more than 1 drink per day.  Over age 81 - no more than 1 drink per day. A standard drink is 12 grams of pure alcohol, which is equal to a 12 ounce bottle of beer or wine cooler, a 5 ounce glass of wine, or 1.5 ounces of distilled spirits (such as whiskey, brandy, vodka, or rum).  ABSTAIN FROM (DO NOT DRINK) ALCOHOL:  When pregnant or considering pregnancy.  When taking a medication that interacts with alcohol.  If you are alcohol dependent.  A medical condition that prohibits drinking alcohol (such as ulcer, liver disease, or heart disease). DISCUSS WITH YOUR CAREGIVER:  If you are at risk for coronary heart disease, discuss the potential benefits and risks of alcohol use: Light to moderate drinking is associated with lower rates of coronary heart disease in certain populations (for example, men over age 90 and postmenopausal women). Infrequent or nondrinkers are advised not to begin light to moderate drinking to reduce the risk of coronary heart disease so as to avoid creating an alcohol-related problem. Similar protective effects can likely be gained through proper diet and exercise.  Women and the elderly have  smaller amounts of body water than men. As a result women and the elderly achieve a higher blood alcohol concentration after drinking the same amount of alcohol.  Exposing a fetus to alcohol can cause a broad range of birth defects referred to as Fetal Alcohol Syndrome (FAS) or Alcohol-Related Birth Defects (ARBD). Although FAS/ARBD is connected with excessive alcohol consumption during pregnancy, studies also have reported neurobehavioral problems in infants born to mothers reporting drinking an average of 1 drink per day during pregnancy.  Heavier drinking (the consumption of more than 4 drinks per occasion by men and more than 3 drinks per occasion by women) impairs learning (cognitive) and psychomotor functions and increases the risk of alcohol-related problems, including accidents and injuries. CAGE QUESTIONS:   Have you ever felt that you should Cut down on your drinking?  Have people Annoyed you by criticizing your drinking?  Have you ever felt bad or Guilty about your drinking?  Have you ever had a drink first thing in the morning to steady your nerves or get rid of a hangover (Eye opener)? If you answered positively to any of these questions: You may be at risk for alcohol-related problems if alcohol consumption is:   Men: Greater than 14 drinks per week or more than 4 drinks per occasion.  Women: Greater than 7 drinks per week or more than 3 drinks per occasion. Do you or your family have a medical history of alcohol-related problems, such as:  Blackouts.  Sexual dysfunction.  Depression.  Trauma.  Liver dysfunction.  Sleep disorders.  Hypertension.  Chronic  abdominal pain.  Has your drinking ever caused you problems, such as problems with your family, problems with your work (or school) performance, or accidents/injuries?  Do you have a compulsion to drink or a preoccupation with drinking?  Do you have poor control or are you unable to stop drinking once you have  started?  Do you have to drink to avoid withdrawal symptoms?  Do you have problems with withdrawal such as tremors, nausea, sweats, or mood disturbances?  Does it take more alcohol than in the past to get you high?  Do you feel a strong urge to drink?  Do you change your plans so that you can have a drink?  Do you ever drink in the morning to relieve the shakes or a hangover? If you have answered a number of the previous questions positively, it may be time for you to talk to your caregivers, family, and friends and see if they think you have a problem. Alcoholism is a chemical dependency that keeps getting worse and will eventually destroy your health and relationships. Many alcoholics end up dead, impoverished, or in prison. This is often the end result of all chemical dependency.  Do not be discouraged if you are not ready to take action immediately.  Decisions to change behavior often involve up and down desires to change and feeling like you cannot decide.  Try to think more seriously about your drinking behavior.  Think of the reasons to quit. WHERE TO GO FOR ADDITIONAL INFORMATION   The [place] on Alcohol Abuse and Alcoholism (NIAAA) http://www.bradshaw.com/  [employer] on Alcoholism and Drug Dependence (NCADD) www.ncadd.[place] (ASAM) http://carpenter.net/  Document Released: 03/01/2005 Document Revised: 05/24/2011 Document Reviewed: 10/18/2007 [facility] Patient Information 2014 [place].     Alcohol Intoxication Alcohol intoxication occurs when the amount of alcohol that a person has consumed impairs his or her ability to mentally and physically function. Alcohol directly impairs the normal chemical activity of the brain. Drinking large amounts of alcohol can lead to changes in mental function and behavior, and it can cause many physical effects that can be harmful.  Alcohol intoxication can range in severity from mild to  very severe. Various factors can affect the level of intoxication that occurs, such as the person's age, gender, weight, frequency of alcohol consumption, and the presence of other medical conditions (such as diabetes, seizures, or heart conditions). Dangerous levels of alcohol intoxication may occur when people drink large amounts of alcohol in a short period (binge drinking). Alcohol can also be especially dangerous when combined with certain prescription medicines or "recreational" drugs. SIGNS AND SYMPTOMS Some common signs and symptoms of mild alcohol intoxication include:  Loss of coordination.  Changes in mood and behavior.  Impaired judgment.  Slurred speech. As alcohol intoxication progresses to more severe levels, other signs and symptoms will appear. These may include:  Vomiting.  Confusion and impaired memory.  Slowed breathing.  Seizures.  Loss of consciousness. DIAGNOSIS  Your health care provider will take a medical history and perform a physical exam. You will be asked about the amount and type of alcohol you have consumed. Blood tests will be done to measure the concentration of alcohol in your blood. In many places, your blood alcohol level must be lower than 80 mg/dL (0.08%) to legally drive. However, many dangerous effects of alcohol can occur at much lower levels.  TREATMENT  People with alcohol intoxication often do not require treatment. Most of  the effects of alcohol intoxication are temporary, and they go away as the alcohol naturally leaves the body. Your health care provider will monitor your condition until you are stable enough to go home. Fluids are sometimes given through an IV access tube to help prevent dehydration.  HOME CARE INSTRUCTIONS  Do not drive after drinking alcohol.  Stay hydrated. Drink enough water and fluids to keep your urine clear or pale yellow. Avoid caffeine.   Only take over-the-counter or prescription medicines as directed by your  health care provider.  SEEK MEDICAL CARE IF:   You have persistent vomiting.   You do not feel better after a few days.  You have frequent alcohol intoxication. Your health care provider can help determine if you should see a substance use treatment counselor. SEEK IMMEDIATE MEDICAL CARE IF:   You become shaky or tremble when you try to stop drinking.   You shake uncontrollably (seizure).   You throw up (vomit) blood. This may be bright red or may look like black coffee grounds.   You have blood in your stool. This may be bright red or may appear as a black, tarry, bad smelling stool.   You become lightheaded or faint.  MAKE SURE YOU:   Understand these instructions.  Will watch your condition.  Will get help right away if you are not doing well or get worse. Document Released: 12/09/2004 Document Revised: 11/01/2012 Document Reviewed: 08/04/2012 [facility] Patient Information 2014 [place].  Substance Abuse Your exam indicates that you have a problem with substance abuse. Substance abuse is the misuse of alcohol or drugs that causes problems in family life, friendships, and work relationships. Substance abuse is the most important cause of premature illness, disability, and death in our society. It is also the greatest threat to a person's mental and spiritual well being. Substance abuse can start out in an innocent way, such as social drinking or taking a little extra medication prescribed by your doctor. No one starts out with the intention of becoming an alcoholic or an addict. Substance abuse victims cannot control their use of alcohol or drugs. They may become intoxicated daily or go on weekend binges. Often there is a strong desire to quit, but attempts to stop using often fail. Encounters with law enforcement or conflicts with family members, friends, and work associates are signs of a potential problem. Recovery is always possible, although the craving for some  drugs makes it difficult to quit without assistance. Many treatment programs are available to help people stop abusing alcohol or drugs. The first step in treatment is to admit you have a problem. This is a major hurdle because denial is a powerful force with substance abuse. Alcoholics Anonymous, Narcotics Anonymous, Cocaine Anonymous, and other recovery groups and programs can be very useful in helping people to quit. If you do not feel okay about your drug or alcohol use and if it is causing you trouble, we want to encourage you to talk about it with your doctor or with someone from a recovery group who can help you. You could also call the [employer] on Drug Abuse at 1-800-662-HELP. It is up to you to take the first step. AL-ANON and ALA-TEEN are support groups for friends and family members of an alcohol or drug dependent person. The people who love and care for the alcoholic or addicted person often need help, too. For information about these organizations, check your phone directory or call a local alcohol or drug  treatment center. Document Released: 04/08/2004 Document Revised: 05/24/2011 Document Reviewed: 03/02/2008 [facility] Patient Information 2014 [place].   Mood Disorders Mood disorders are conditions that affect the way a person feels emotionally. The main mood disorders include:  Depression.  Bipolar disorder.  Dysthymia. Dysthymia is a mild, lasting (chronic) depression. Symptoms of dysthymia are similar to depression, but not as severe.  Cyclothymia. Cyclothymia includes mood swings, but the highs and lows are not as severe as they are in bipolar disorder. Symptoms of cyclothymia are similar to those of bipolar disorder, but less extreme. CAUSES  Mood disorders are probably caused by a combination of factors. People with mood disorders seem to have physical and chemical changes in their brains. Mood disorders run in families, so there may be genetic causes. Severe  trauma or stressful life events may also increase the risk of mood disorders.  SYMPTOMS  Symptoms of mood disorders depend on the specific type of condition. Depression symptoms include:  Feeling sad, worthless, or hopeless.  Negative thoughts.  Inability to enjoy one's usual activities.  Low energy.  Sleeping too much or too little.  Appetite changes.  Crying.  Concentration problems.  Thoughts of harming oneself. Bipolar disorder symptoms include:  Periods of depression (see above symptoms).  Mood swings, from sadness and depression, to abnormal elation and excitement.  Periods of mania:  Racing thoughts.  Fast speech.  Poor judgment, and careless, dangerous choices.  Decreased need for sleep.  Risky behavior.  Difficulty concentrating.  Irritability.  Increased energy.  Increased sex drive. DIAGNOSIS  There are no blood tests or X-rays that can confirm a mood disorder. However, your caregiver may choose to run some tests to make sure that there is not another physical cause for your symptoms. A mood disorder is usually diagnosed after an in-depth interview with a caregiver. TREATMENT  Mood disorders can be treated with one or more of the following:  Medicine. This may include antidepressants, mood-stabilizers, or anti-psychotics.  Psychotherapy (talk therapy).  Cognitive behavioral therapy. You are taught to recognize negative thoughts and behavior patterns, and replace them with healthy thoughts and behaviors.  Electroconvulsive therapy. For very severe cases of deep depression, a series of treatments in which an electrical current is applied to the brain.  Vagus nerve stimulation. A pulse of electricity is applied to a portion of the brain.  Transcranial magnetic stimulation. Powerful magnets are placed on the head that produce electrical currents.  Hospitalization. In severe situations, or when someone is having serious thoughts of harming him or  herself, hospitalization may be necessary in order to keep the person safe. This is also done to quickly start and monitor treatment. HOME CARE INSTRUCTIONS   Take your medicine exactly as directed.  Attend all of your therapy sessions.  Try to eat regular, healthy meals.  Exercise daily. Exercise may improve mood symptoms.  Get good sleep.  Do not drink alcohol or use pot or other drugs. These can worsen mood symptoms and cause anxiety and psychosis.  Tell your caregiver if you develop any side effects, such as feeling sick to your stomach (nauseous), dry mouth, dizziness, constipation, drowsiness, tremor, weight gain, or sexual symptoms. He or she may suggest things you can do to improve symptoms.  Learn ways to cope with the stress of having a chronic illness. This includes yoga, meditation, tai chi, or participating in a support group.  Drink enough water to keep your urine clear or pale yellow. Eat a high-fiber diet. These habits  may help you avoid constipation from your medicine. SEEK IMMEDIATE MEDICAL CARE IF:  Your mood worsens.  You have thoughts of hurting yourself or others.  You cannot care for yourself.  You develop the sensation of hearing or seeing something that is not actually present (auditory or visual hallucinations).  You develop abnormal thoughts. Document Released: 12/27/2008 Document Revised: 05/24/2011 Document Reviewed: 12/27/2008 [facility] Patient Information 2014 [place], [place].     Emergency Department Resource Guide 1) Find a Doctor and Pay Out of Pocket Although you won't have to find out who is covered by your insurance plan, it is a good idea to ask around and get recommendations. You will then need to call the office and see if the doctor you have chosen will accept you as a new patient and what types of options they offer for patients who are self-pay. Some doctors offer discounts or will set up payment plans for their patients who do not have  insurance, but you will need to ask so you aren't surprised when you get to your appointment.  2) Contact Your Local Health Department Not all health departments have doctors that can see patients for sick visits, but many do, so it is worth a call to see if yours does. If you don't know where your local health department is, you can check in your phone book. The CDC also has a tool to help you locate your state's health department, and many state websites also have listings of all of their local health departments.  3) Find a [facility] If your illness is not likely to be very severe or complicated, you may want to try a walk in clinic. These are popping up all over the country in pharmacies, drugstores, and shopping centers. They're usually staffed by nurse practitioners or physician assistants that have been trained to treat common illnesses and complaints. They're usually fairly quick and inexpensive. However, if you have serious medical issues or chronic medical problems, these are probably not your best option.  No Primary Care Doctor: - Call Health Connect at  [phone] - they can help you locate a primary care doctor that  accepts your insurance, provides certain services, etc. - Physician Referral Service- [phone]  Chronic Pain Problems: Organization         Address  Phone   Notes  [facility]  [phone] Patients need to be referred by their primary care doctor.   Medication Assistance: Organization         Address  Phone   Notes  [facility] Medication [facility] [address] [place], [place] [address] [phone] --Must be a resident of [facility] -- Must have NO insurance coverage whatsoever (no Medicaid/ Medicare, etc.) -- The pt. MUST have a primary care doctor that directs their care regularly and follows them in the community   [facility]ist  [phone]   [employer]  [phone]    Agencies that provide  inexpensive medical care: Organization         Address  Phone   Notes  [place]  [phone]   [facility]    [phone]   [facility] [place], [place] [address] [phone]   [place] [address] [place] [phone]   Planned Parenthood    [phone]   [facility]    [phone]  Community [facility] and [place]  [address], [place] Phone:  [phone], Fax:  [phone] Hours of Operation:  9 am - 6 pm, M-F.  Also accepts Medicaid/Medicare and self-pay.  [facility] for [place], [address], [place] Phone: [phone], Fax: [phone]. Hours of Operation:  8:30 am - 5:30 pm, M-F.  Also accepts Medicaid and self-pay.  [facility] [place] [name] [place] Phone: [phone]   [place], [place], [place] [phone], Ext. [phone] Mondays & Thursdays: 7-9 AM.  First 15 patients are seen on a first come, first serve basis.    [place] Providers:  Organization         Address  Phone   Notes  [facility] [address], [place] [phone] Also accepts self-pay patients.  [facility] [address], [place]  [phone]   [place], [address], [place] [phone]   [facility] [address] [place] [phone]   Lucianne Lei [address], [place]   [phone] Only accepts [place] [facility] [place] patients after they have their name applied to their card.   Self-Pay (no insurance) in [facility]:  Organization         Address  Phone   Notes  Sickle Cell Patients, [facility] [place] [phone]   [facility]  Care [place] [phone]   [facility] [place]  [place], [place], [place] [phone]   [facility]/Dr. Osei-Bonsu  [address] [place] or [address], [place] [phone] Phone number for both [place] and [place] locations is the same.  Urgent [facility] and [facility] [address], [place] [phone]   [facility] [address], [place] or [address] [phone]   [facility] [address], [place] [phone], phone; [phone], fax Sees patients 1st and 3rd Saturday of every month.  Must not qualify for public or private insurance (i.e. Medicaid, Medicare, [place] Health Choice, Veterans' Benefits)  Household income should be no more than 200% of the poverty level The clinic cannot treat you if you are pregnant or think you are pregnant  Sexually transmitted diseases are not treated at the clinic.    Dental Care: Organization         Address  Phone  Notes  [facility] Department of [facility] [place] [phone] Accepts children up to age 6 who are enrolled in [place] or [place]; pregnant women with a Medicaid card; and children who have applied for Medicaid or [place] Health Choice, but were declined, whose parents can pay a reduced fee at time of service.  [facility] Department of [facility]  [address], [place] [phone] Accepts children up to age 11 who are enrolled in [place] or [place]; pregnant women with a Medicaid card; and children who have applied for Medicaid or [place] Health Choice, but were declined, whose parents can pay a reduced fee at time of service.  [place] [facility]  [phone]  [place], [place] [phone] Patients are seen by appointment only. Walk-ins are  not accepted. [place] will see patients 20 years of age and older. Monday - Tuesday (8am-5pm) Most Wednesdays (8:30-5pm) $30 per visit, cash only  [facility] Adult Dental Access PROGRAM  [address], [facility] [phone] Patients are seen by appointment only. Walk-ins are not accepted. [place] will see patients 67 years of age and older. One Wednesday Evening (Monthly: Volunteer Based).  $30 per visit, cash only  [place]  [phone] for adults; Children under age 67, call Graduate Pediatric Dentistry at [phone]. Children aged 24-14, please call [phone] to request a pediatric application.  Dental services are provided in all areas of dental care including fillings, crowns and bridges, complete and partial dentures, implants, gum treatment, root canals, and extractions. Preventive care is also provided. Treatment is provided to both adults and children. Patients are selected via a lottery and there is often a waiting list.   [facility] [address] [place]  [phone] [email or web address]   [facility] [address] [place], [place] [phone], Ext. [phone] Second and Fourth Thursday of each month, opens at 6:30 AM; Clinic ends at 9 AM.  Patients are seen on a first-come first-served basis, and a limited number are seen during each clinic.   [facility]  [address] Hillard Danker [place], [place] [phone]   Eligibility Requirements You must have lived in [place], [place], or [place] counties for at least the last three months.   You cannot be eligible for state or federal sponsored [employer], including [employer], [place], or [employer].   You generally cannot be eligible for healthcare insurance through your employer.    How to apply: Eligibility screenings are held every Tuesday and Wednesday afternoon from 1:00 pm until 4:00 pm. You do not need an appointment for  the interview!  [facility] [address], [place], [place]   [place]  [place] Department  [place]  [phone]    Behavioral Health Resources in the Community: Intensive Outpatient Programs Organization         Address  Phone  Notes  [place]. [address] [place], [place] [phone]   [facility] [address], [place], [place]   ADS: Alcohol & Drug [facility] [address] [place], [place]   [place] [address]  [place], [place] or [phone]   Substance Abuse Resources Organization         Address  Phone  Notes  Alcohol and Drug Services  [phone]   [place]  [phone]   [facility] [place]   Chinita Pester  [phone]   Residential & Outpatient Substance Abuse Program  [phone]   Psychological Services Organization         Address  Phone  Notes  [facility]  [place]  [phone]   [facility] [address], [place] or [phone]    Mobile Crisis Teams Organization         Address  Phone  Notes  [facility], Mobile Crisis Care Unit  [phone]   [facility]  [address] [place], [place]   Ivin Booty  [place] [phone]    Self-Help/Support Groups Organization         Address  Phone             Notes  [facility] [place] - variety of support groups  [place] Call for more information  Narcotics Anonymous (NA), [facility] [address], [employer] [place]  2 meetings at this location   Special educational needs teacher         Address  Phone  Notes  [facility]  [place],    [place]  [phone]   [facility]  [address], [place] [id number], [place], [place]   [place], [place] [phone] Admissions: 8am-3pm M-F  Incentives Substance [place] [address],    [place], [place] [phone]   [facility] [address] [place], [place], [place]   [facility] [address],  [place], [place]   [facility] - Intensive [address], [place], [place]   [facility]  [place], [place] [phone] or [phone]   [facility] ([facility]) [address] [place], [place] Accepts Medicaid  [facility] [address],  [place] [phone] Substance Abuse/Addiction Treatment   [facility] Organization         Address  Phone  Notes  [facility]  [phone]   Domenic Schwab, PhD [address] [place], [place]   [phone] or [phone]   [place], [place] [phone]   [name] [address], [place], [place] [phone] Insurance/Medicaid/sponsorship through [facility] and Families [address] [place]                                    [place], [place] [phone] [place] [address], [place] [phone]    Dr. Adele Schilder  [phone]   [facility] Dept. 1) [address], [place] 2) [place] 3)  [place] [address], [place] [phone]  [phone]   [place] [phone] or [phone] (After Hours)

## 2013-04-18 NOTE — ED Notes (Signed)
Bed: [id number] Expected date:  Expected time:  Means of arrival:  Comments: EMS/medical clearance

## 2013-04-22 ENCOUNTER — Encounter (HOSPITAL_COMMUNITY): Payer: Self-pay | Admitting: Emergency Medicine

## 2013-04-22 ENCOUNTER — Emergency Department (HOSPITAL_COMMUNITY)
Admission: EM | Admit: 2013-04-22 | Discharge: 2013-04-24 | Disposition: A | Discharge status: Home / Self Care | Payer: Self-pay | Attending: Emergency Medicine | Admitting: Emergency Medicine

## 2013-04-22 DIAGNOSIS — T394X2A Poisoning by antirheumatics, not elsewhere classified, intentional self-harm, initial encounter: Secondary | ICD-10-CM | POA: Insufficient documentation

## 2013-04-22 DIAGNOSIS — F1410 Cocaine abuse, uncomplicated: Secondary | ICD-10-CM | POA: Insufficient documentation

## 2013-04-22 DIAGNOSIS — T1491XA Suicide attempt, initial encounter: Secondary | ICD-10-CM | POA: Diagnosis present

## 2013-04-22 DIAGNOSIS — F10929 Alcohol use, unspecified with intoxication, unspecified: Secondary | ICD-10-CM

## 2013-04-22 DIAGNOSIS — F411 Generalized anxiety disorder: Secondary | ICD-10-CM | POA: Insufficient documentation

## 2013-04-22 DIAGNOSIS — F329 Major depressive disorder, single episode, unspecified: Secondary | ICD-10-CM | POA: Insufficient documentation

## 2013-04-22 DIAGNOSIS — T398X2A Poisoning by other nonopioid analgesics and antipyretics, not elsewhere classified, intentional self-harm, initial encounter: Secondary | ICD-10-CM

## 2013-04-22 DIAGNOSIS — F1994 Other psychoactive substance use, unspecified with psychoactive substance-induced mood disorder: Secondary | ICD-10-CM

## 2013-04-22 DIAGNOSIS — T398X1A Poisoning by other nonopioid analgesics and antipyretics, not elsewhere classified, accidental (unintentional), initial encounter: Secondary | ICD-10-CM | POA: Insufficient documentation

## 2013-04-22 DIAGNOSIS — F17200 Nicotine dependence, unspecified, uncomplicated: Secondary | ICD-10-CM | POA: Insufficient documentation

## 2013-04-22 DIAGNOSIS — F1910 Other psychoactive substance abuse, uncomplicated: Secondary | ICD-10-CM

## 2013-04-22 DIAGNOSIS — F1010 Alcohol abuse, uncomplicated: Secondary | ICD-10-CM | POA: Insufficient documentation

## 2013-04-22 DIAGNOSIS — F3289 Other specified depressive episodes: Secondary | ICD-10-CM | POA: Insufficient documentation

## 2013-04-22 DIAGNOSIS — R45851 Suicidal ideations: Secondary | ICD-10-CM

## 2013-04-22 DIAGNOSIS — R079 Chest pain, unspecified: Secondary | ICD-10-CM | POA: Insufficient documentation

## 2013-04-22 LAB — COMPREHENSIVE METABOLIC PANEL
ALT: 18 U/L (ref 0–53)
AST: 27 U/L (ref 0–37)
Albumin: 3.8 g/dL (ref 3.5–5.2)
Alkaline Phosphatase: 63 U/L (ref 39–117)
BILIRUBIN TOTAL: 0.4 mg/dL (ref 0.3–1.2)
BUN: 9 mg/dL (ref 6–23)
CALCIUM: 8.6 mg/dL (ref 8.4–10.5)
CO2: 22 meq/L (ref 19–32)
CREATININE: 0.9 mg/dL (ref 0.50–1.35)
Chloride: 100 mEq/L (ref 96–112)
GLUCOSE: 69 mg/dL — AB (ref 70–99)
Potassium: 3.9 mEq/L (ref 3.7–5.3)
Sodium: 142 mEq/L (ref 137–147)
Total Protein: 7.5 g/dL (ref 6.0–8.3)

## 2013-04-22 LAB — CBC WITH DIFFERENTIAL/PLATELET
BASOS PCT: 0 % (ref 0–1)
Basophils Absolute: 0 10*3/uL (ref 0.0–0.1)
Eosinophils Absolute: 0 10*3/uL (ref 0.0–0.7)
Eosinophils Relative: 0 % (ref 0–5)
HCT: 41.2 % (ref 39.0–52.0)
HEMOGLOBIN: 14.7 g/dL (ref 13.0–17.0)
Lymphocytes Relative: 28 % (ref 12–46)
Lymphs Abs: 2.7 10*3/uL (ref 0.7–4.0)
MCH: 30.6 pg (ref 26.0–34.0)
MCHC: 35.7 g/dL (ref 30.0–36.0)
MCV: 85.7 fL (ref 78.0–100.0)
MONOS PCT: 8 % (ref 3–12)
Monocytes Absolute: 0.8 10*3/uL (ref 0.1–1.0)
NEUTROS ABS: 6.2 10*3/uL (ref 1.7–7.7)
Neutrophils Relative %: 64 % (ref 43–77)
PLATELETS: 225 10*3/uL (ref 150–400)
RBC: 4.81 MIL/uL (ref 4.22–5.81)
RDW: 14.9 % (ref 11.5–15.5)
WBC: 9.7 10*3/uL (ref 4.0–10.5)

## 2013-04-22 LAB — URINALYSIS, ROUTINE W REFLEX MICROSCOPIC
Bilirubin Urine: NEGATIVE
Glucose, UA: NEGATIVE mg/dL
HGB URINE DIPSTICK: NEGATIVE
Ketones, ur: 15 mg/dL — AB
LEUKOCYTES UA: NEGATIVE
NITRITE: NEGATIVE
PROTEIN: NEGATIVE mg/dL
SPECIFIC GRAVITY, URINE: 1.016 (ref 1.005–1.030)
Urobilinogen, UA: 0.2 mg/dL (ref 0.0–1.0)
pH: 5.5 (ref 5.0–8.0)

## 2013-04-22 LAB — RAPID URINE DRUG SCREEN, HOSP PERFORMED
AMPHETAMINES: NOT DETECTED
BARBITURATES: NOT DETECTED
BENZODIAZEPINES: NOT DETECTED
COCAINE: POSITIVE — AB
OPIATES: NOT DETECTED
Tetrahydrocannabinol: NOT DETECTED

## 2013-04-22 LAB — POCT I-STAT TROPONIN I: Troponin i, poc: 0 ng/mL (ref 0.00–0.08)

## 2013-04-22 LAB — SALICYLATE LEVEL

## 2013-04-22 LAB — ETHANOL: Alcohol, Ethyl (B): 179 mg/dL — ABNORMAL HIGH (ref 0–11)

## 2013-04-22 LAB — ACETAMINOPHEN LEVEL: Acetaminophen (Tylenol), Serum: <15 ug/mL (ref 10–30)

## 2013-04-22 LAB — GLUCOSE, CAPILLARY: GLUCOSE-CAPILLARY: 66 mg/dL — AB (ref 70–99)

## 2013-04-22 MED ORDER — THIAMINE HCL 100 MG/ML IJ SOLN: 100.0000 mg | Freq: Every day | INTRAMUSCULAR | Status: DC

## 2013-04-22 MED ORDER — ZOLPIDEM TARTRATE 5 MG PO TABS
5.0000 mg | ORAL_TABLET | Freq: Every evening | ORAL | Status: DC | PRN
  Administered 2013-04-23: 5 mg via ORAL
  Filled 2013-04-22: qty 1

## 2013-04-22 MED ORDER — IBUPROFEN 200 MG PO TABS: 600.0000 mg | ORAL_TABLET | Freq: Three times a day (TID) | ORAL | Status: DC | PRN

## 2013-04-22 MED ORDER — LORAZEPAM 1 MG PO TABS
1.0000 mg | ORAL_TABLET | Freq: Three times a day (TID) | ORAL | Status: DC | PRN
  Administered 2013-04-22: 1 mg via ORAL
  Filled 2013-04-22: qty 1

## 2013-04-22 MED ORDER — SODIUM CHLORIDE 0.9 % IV BOLUS (SEPSIS)
1000.0000 mL | Freq: Once | INTRAVENOUS | Status: AC
  Administered 2013-04-22: 1000 mL via INTRAVENOUS

## 2013-04-22 MED ORDER — ADULT MULTIVITAMIN W/MINERALS CH
1.0000 | ORAL_TABLET | Freq: Every day | ORAL | Status: DC
  Administered 2013-04-22 – 2013-04-24 (×3): 1 via ORAL
  Filled 2013-04-22 (×3): qty 1

## 2013-04-22 MED ORDER — ACETAMINOPHEN 325 MG PO TABS: 650.0000 mg | ORAL_TABLET | ORAL | Status: DC | PRN

## 2013-04-22 MED ORDER — LORAZEPAM 1 MG PO TABS: 0.0000 mg | ORAL_TABLET | Freq: Two times a day (BID) | ORAL | Status: DC

## 2013-04-22 MED ORDER — FOLIC ACID 1 MG PO TABS
1.0000 mg | ORAL_TABLET | Freq: Every day | ORAL | Status: DC
  Administered 2013-04-22 – 2013-04-24 (×3): 1 mg via ORAL
  Filled 2013-04-22 (×3): qty 1

## 2013-04-22 MED ORDER — VITAMIN B-1 100 MG PO TABS
100.0000 mg | ORAL_TABLET | Freq: Every day | ORAL | Status: DC
  Administered 2013-04-22 – 2013-04-24 (×3): 100 mg via ORAL
  Filled 2013-04-22 (×3): qty 1

## 2013-04-22 MED ORDER — NICOTINE 21 MG/24HR TD PT24
21.0000 mg | MEDICATED_PATCH | Freq: Every day | TRANSDERMAL | Status: DC
  Filled 2013-04-22 (×2): qty 1

## 2013-04-22 MED ORDER — ONDANSETRON HCL 4 MG PO TABS: 4.0000 mg | ORAL_TABLET | Freq: Three times a day (TID) | ORAL | Status: DC | PRN

## 2013-04-22 MED ORDER — DIPHENHYDRAMINE HCL 25 MG PO CAPS
25.0000 mg | ORAL_CAPSULE | Freq: Four times a day (QID) | ORAL | Status: DC | PRN
  Administered 2013-04-22: 25 mg via ORAL
  Filled 2013-04-22: qty 1

## 2013-04-22 MED ORDER — ALUM & MAG HYDROXIDE-SIMETH 200-200-20 MG/5ML PO SUSP: 30.0000 mL | ORAL | Status: DC | PRN

## 2013-04-22 MED ORDER — CLOTRIMAZOLE 1 % EX CREA
1.0000 "application " | TOPICAL_CREAM | Freq: Two times a day (BID) | CUTANEOUS | Status: DC
  Administered 2013-04-22 – 2013-04-24 (×4): 1 via TOPICAL
  Filled 2013-04-22: qty 15

## 2013-04-22 MED ORDER — LORAZEPAM 1 MG PO TABS: 0.0000 mg | ORAL_TABLET | Freq: Four times a day (QID) | ORAL | Status: AC

## 2013-04-22 NOTE — Progress Notes (Signed)
Case Manager and Social worker met patient at bedside.Patient reports today's ED visit is secondary to increased feelings of depression.This CM educated patient he has   Has had 14 ED visits in 6 months.CM educated patient from previous ED visits he received verbal education / written resources for the [place]   however   Patient has not attempted to follow up on either resource.Patient reports he cant drive, patient encouraged to utilize local transport.Patient reports his funds are limited this CM  Asked about the cost of his recreational drug use patient reports friends supply his alcohol and drugs.Patient requesting information on [facility] resources.Patient provided  With   MEDICAID information from the Social worker.No further needs identified.

## 2013-04-22 NOTE — Progress Notes (Signed)
Pt's referral has been faxed to the following facilities with available beds:  [facility]  [name] Disposition [facility]

## 2013-04-22 NOTE — ED Notes (Signed)
Pt to receive diet tray. 
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
## 2013-04-22 NOTE — ED Notes (Signed)
Received telephone order from Dr. Romeo AppleHarrison md to d/c saline lock

## 2013-04-22 NOTE — ED Notes (Signed)
Charge RN, [facility], security and [employer] notified of patient in department.

## 2013-04-22 NOTE — ED Notes (Signed)
Pt valuables currently in personal belonging bag at nurses station Pod B

## 2013-04-22 NOTE — ED Notes (Signed)
Walking around [place] jumping in front of cars. Took 30 vicodin, $400 crack, and etoh. Vs: 120/63, hr, 90, cbg 83, rr 18.  18 ga lt. Hand. 324 mg asa and x 1 ntg sl.0.4mg .  C/o lt. Cp 6/10. No change with ntg.

## 2013-04-22 NOTE — Progress Notes (Signed)
Weekend CSW provided patient with packet of information on community resources including a [employer] packet, information on [facility] treatment program, and local shelter list. Patient thanked CSW for assistance.  Samuella BruinKristin Vonzell Lindblad, MSW, LCSWA Clinical Social Worker [facility] Emergency Dept. [phone]

## 2013-04-22 NOTE — Progress Notes (Signed)
Weekend CSW met with patient, alongside ED CM, to discuss patient's current admission and treatment needs. Patient states that he has been feeling depressed and suicidal which led him to come to the hospital. CSW discussed with patient that he has been to [facility] several times, yet it appears that it has not been beneficial in helping him to stabilize his symptoms in the community as evidenced by 14 ED visits within past 6 months.. Patient reports that [facility] stays have been beneficial but cannot verbalize how it has been beneficial- he denies learning coping skills or receiving medication management. CSW asked patient what he would need to be able to help with his depression and SI that he would be able to manage in the community without frequent visits to the ED. Patient reports that he wants to go to a residential treatment program such as [name] or [facility]. Patient has been to [facility] in the past. CSW explained to patient that without health insurance or a source of income, it can be difficult to access medical, mental health, and substance abuse treatment. CSW offered to provide patient with Medicaid application, patient agreed but did not appear interested or motivated with following up with application process. Patient reports that he was staying with his girlfriend but that they recently broke up and he no longer has a place to stay. He has stayed at [facility] but reports that he can no longer stay there but it was unclear why. Patient reports that one of his barriers to utilizing services is lack of transportation, CSW spoke with patient about using the bus system. Patient reports that he doesn't have money for the bus and it is inconvenient. Patient reports that he has been using drugs and drinking almost daily. When asked how he is affording this, he states that people just give him drugs and alcohol. CSW and CM provided support to patient and emphasized that medical team is trying to connect him with resources  that can be beneficial, but that it is his responsibility to follow up with these resources and make changes in his life. Patient again reports that he wants to go to [facility] or [facility]. CSW/CM informed patient that they are working with [facility] and following for appropriate discharge planning.  Tilden Fossa, MSW, [place] Clinical Social Worker [facility] Emergency Dept. [phone]
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
## 2013-04-22 NOTE — ED Notes (Signed)
Patient felt anxious needed something for sleep

## 2013-04-22 NOTE — ED Notes (Signed)
PATIENT REPORTS HE HAS BEEN DRINKING 9-10 40 OUNCE BEERS DAILY. HE ALSO REPORTS SMOKING CRACK 3-4 DAYS A WEEK. HE DENIES OTHER DRUG USE.

## 2013-04-23 LAB — URINE CULTURE
Colony Count: NO GROWTH
Culture: NO GROWTH

## 2013-04-23 MED ORDER — WHITE PETROLATUM GEL
Status: DC | PRN
  Administered 2013-04-24: 0.2 via TOPICAL
  Filled 2013-04-23: qty 5

## 2013-04-23 NOTE — Progress Notes (Signed)
Declined at [facility].

## 2013-04-23 NOTE — Progress Notes (Signed)
No new CM needs.Awaits placement.Patient provided with CM and Social work resources over the weekend.

## 2013-04-23 NOTE — ED Notes (Signed)
Assumed care of pt from K. Cobb RN 

## 2013-04-23 NOTE — Progress Notes (Signed)
Tech [facility], facility had bed Availability.

## 2013-04-23 NOTE — Progress Notes (Signed)
[facility] said pt. Had court cases that needed to be taken care of before entering facility.

## 2013-04-23 NOTE — Progress Notes (Addendum)
[facility] CM [facility] concerning [facility] plan. Spoke with Corrie DandyMary at [facility] she states, No disposition tech is [facility]  At this time. Called [facility] and [facility] for disposition update. As per Elijah Birkom, Pt has max out benefits and placement in other facilities is now being sought after.

## 2013-04-24 NOTE — Progress Notes (Signed)
CSW consult to pt due to mental health and substance abuse usage. Pt reports that he would like to go into long term  Substance abuse treatment. CSW placed call to [facility] [phone], a 2 year treatment program and arranged a phone interview which has been scheduled for 04/27/13 at 10:30 am. [facility] staff shared with CSW that pt could potentially admit to program either Friday or Saturday of this week. Pt has to be medically and psychologically cleared. Pt has been made aware  and is on board with plan. CSW also asked pt if his has a plan B for follow up if this plan does not work. Pt reports [facility]. CSW shared with pt that this a great idea and added [facility] to his list for either an ACTT team or Administrator, artsCommunity Support Team (CST) to assist in stabilization in the community. CSW explained to pt what the services entail which the primary purpose is stabilization in regards to mental health, psychosocial issues and to reduce ED visits. Pt voiced understanding and would like to proceed with plan.    [address]Dor[address] Latrica [name], ConnecticutLCSWA [phone]

## 2013-04-24 NOTE — ED Provider Notes (Signed)
Discussed pt with SW, CM, TTS - they indicate pt declined [facility].  Pt appears to have chronic substance abuse disorder, chronic substance abuse mood disorder, and has indicated numerous times in past that he has taken a handful of pills due to feeling depressed - pt reports ingesting vicodin and other acetaminophen containing medications in past however, no review records, not a single elevated/detectable acetaminophen level on any of those stays.   [facility] indicates pt appears not to have benefited and/or changed lifestyle choices after [facility], and as such, [facility] for same of little benefit to patient.   BH/SW/CM team recommend patient pursue long term program via [facility] - they indicate pt can do intake interview w them this Friday from home.   On recheck of pt, pt exhibits a normal mood and affect. He does not appear acutely depressed, and voices no thoughts of harm to self or others. He states he is familiar with the [facility] program, and that he plans to do intake interview this Friday as recommended to him.  He is optimistic/hopeful about pursuing that option, and feels ready for d/c.  His friend/support is in ED and will assist w that effort.  Pt appears currently stable for d/c.     Suzi RootsKevin E Kinslei Labine, MD 04/24/13 [phone]
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
## 2013-04-24 NOTE — Discharge Planning (Signed)
[facility] Adam [name], Community Eaton CorporationLiaison  Spoke to patient about [facility] [employer], [employer] [facility] the [employer] information given.

## 2013-04-24 NOTE — Progress Notes (Signed)
Await TS for Disposition assistance for discharge plan.No CM needs at this time.

## 2013-04-24 NOTE — Discharge Instructions (Signed)
Follow up with the [facility] program intake interview this Friday at 57 am.  You may also follow up with [facility] in the coming week.  Avoid alcohol and cocaine use.  See resource guide provided for additional community resources.  Return to ER if worse, new symptoms, other concern.      Alcohol Problems Most adults who drink alcohol drink in moderation (not a lot) are at low risk for developing problems related to their drinking. However, all drinkers, including low-risk drinkers, should know about the health risks connected with drinking alcohol. RECOMMENDATIONS FOR LOW-RISK DRINKING  Drink in moderation. Moderate drinking is defined as follows:   Men - no more than 2 drinks per day.  Nonpregnant women - no more than 1 drink per day.  Over age 13 - no more than 1 drink per day. A standard drink is 12 grams of pure alcohol, which is equal to a 12 ounce bottle of beer or wine cooler, a 5 ounce glass of wine, or 1.5 ounces of distilled spirits (such as whiskey, brandy, vodka, or rum).  ABSTAIN FROM (DO NOT DRINK) ALCOHOL:  When pregnant or considering pregnancy.  When taking a medication that interacts with alcohol.  If you are alcohol dependent.  A medical condition that prohibits drinking alcohol (such as ulcer, liver disease, or heart disease). DISCUSS WITH YOUR CAREGIVER:  If you are at risk for coronary heart disease, discuss the potential benefits and risks of alcohol use: Light to moderate drinking is associated with lower rates of coronary heart disease in certain populations (for example, men over age 84 and postmenopausal women). Infrequent or nondrinkers are advised not to begin light to moderate drinking to reduce the risk of coronary heart disease so as to avoid creating an alcohol-related problem. Similar protective effects can likely be gained through proper diet and exercise.  Women and the elderly have smaller amounts of body water than men. As a result women and the  elderly achieve a higher blood alcohol concentration after drinking the same amount of alcohol.  Exposing a fetus to alcohol can cause a broad range of birth defects referred to as Fetal Alcohol Syndrome (FAS) or Alcohol-Related Birth Defects (ARBD). Although FAS/ARBD is connected with excessive alcohol consumption during pregnancy, studies also have reported neurobehavioral problems in infants born to mothers reporting drinking an average of 1 drink per day during pregnancy.  Heavier drinking (the consumption of more than 4 drinks per occasion by men and more than 3 drinks per occasion by women) impairs learning (cognitive) and psychomotor functions and increases the risk of alcohol-related problems, including accidents and injuries. CAGE QUESTIONS:   Have you ever felt that you should Cut down on your drinking?  Have people Annoyed you by criticizing your drinking?  Have you ever felt bad or Guilty about your drinking?  Have you ever had a drink first thing in the morning to steady your nerves or get rid of a hangover (Eye opener)? If you answered positively to any of these questions: You may be at risk for alcohol-related problems if alcohol consumption is:   Men: Greater than 14 drinks per week or more than 4 drinks per occasion.  Women: Greater than 7 drinks per week or more than 3 drinks per occasion. Do you or your family have a medical history of alcohol-related problems, such as:  Blackouts.  Sexual dysfunction.  Depression.  Trauma.  Liver dysfunction.  Sleep disorders.  Hypertension.  Chronic abdominal pain.  Has your drinking ever  caused you problems, such as problems with your family, problems with your work (or school) performance, or accidents/injuries?  Do you have a compulsion to drink or a preoccupation with drinking?  Do you have poor control or are you unable to stop drinking once you have started?  Do you have to drink to avoid withdrawal  symptoms?  Do you have problems with withdrawal such as tremors, nausea, sweats, or mood disturbances?  Does it take more alcohol than in the past to get you high?  Do you feel a strong urge to drink?  Do you change your plans so that you can have a drink?  Do you ever drink in the morning to relieve the shakes or a hangover? If you have answered a number of the previous questions positively, it may be time for you to talk to your caregivers, family, and friends and see if they think you have a problem. Alcoholism is a chemical dependency that keeps getting worse and will eventually destroy your health and relationships. Many alcoholics end up dead, impoverished, or in prison. This is often the end result of all chemical dependency.  Do not be discouraged if you are not ready to take action immediately.  Decisions to change behavior often involve up and down desires to change and feeling like you cannot decide.  Try to think more seriously about your drinking behavior.  Think of the reasons to quit. WHERE TO GO FOR ADDITIONAL INFORMATION   The [place] on Alcohol Abuse and Alcoholism (NIAAA) http://www.bradshaw.com/  [employer] on Alcoholism and Drug Dependence (NCADD) www.ncadd.[place] (ASAM) http://carpenter.net/  Document Released: 03/01/2005 Document Revised: 05/24/2011 Document Reviewed: 10/18/2007 [facility] Patient Information 2014 [place].  Alcohol Problems Most adults who drink alcohol drink in moderation (not a lot) are at low risk for developing problems related to their drinking. However, all drinkers, including low-risk drinkers, should know about the health risks connected with drinking alcohol. RECOMMENDATIONS FOR LOW-RISK DRINKING  Drink in moderation. Moderate drinking is defined as follows:   Men - no more than 2 drinks per day.  Nonpregnant women - no more than 1 drink per day.  Over age 91 - no more than 1 drink  per day. A standard drink is 12 grams of pure alcohol, which is equal to a 12 ounce bottle of beer or wine cooler, a 5 ounce glass of wine, or 1.5 ounces of distilled spirits (such as whiskey, brandy, vodka, or rum).  ABSTAIN FROM (DO NOT DRINK) ALCOHOL:  When pregnant or considering pregnancy.  When taking a medication that interacts with alcohol.  If you are alcohol dependent.  A medical condition that prohibits drinking alcohol (such as ulcer, liver disease, or heart disease). DISCUSS WITH YOUR CAREGIVER:  If you are at risk for coronary heart disease, discuss the potential benefits and risks of alcohol use: Light to moderate drinking is associated with lower rates of coronary heart disease in certain populations (for example, men over age 50 and postmenopausal women). Infrequent or nondrinkers are advised not to begin light to moderate drinking to reduce the risk of coronary heart disease so as to avoid creating an alcohol-related problem. Similar protective effects can likely be gained through proper diet and exercise.  Women and the elderly have smaller amounts of body water than men. As a result women and the elderly achieve a higher blood alcohol concentration after drinking the same amount of alcohol.  Exposing a fetus to alcohol can  cause a broad range of birth defects referred to as Fetal Alcohol Syndrome (FAS) or Alcohol-Related Birth Defects (ARBD). Although FAS/ARBD is connected with excessive alcohol consumption during pregnancy, studies also have reported neurobehavioral problems in infants born to mothers reporting drinking an average of 1 drink per day during pregnancy.  Heavier drinking (the consumption of more than 4 drinks per occasion by men and more than 3 drinks per occasion by women) impairs learning (cognitive) and psychomotor functions and increases the risk of alcohol-related problems, including accidents and injuries. CAGE QUESTIONS:   Have you ever felt that you  should Cut down on your drinking?  Have people Annoyed you by criticizing your drinking?  Have you ever felt bad or Guilty about your drinking?  Have you ever had a drink first thing in the morning to steady your nerves or get rid of a hangover (Eye opener)? If you answered positively to any of these questions: You may be at risk for alcohol-related problems if alcohol consumption is:   Men: Greater than 14 drinks per week or more than 4 drinks per occasion.  Women: Greater than 7 drinks per week or more than 3 drinks per occasion. Do you or your family have a medical history of alcohol-related problems, such as:  Blackouts.  Sexual dysfunction.  Depression.  Trauma.  Liver dysfunction.  Sleep disorders.  Hypertension.  Chronic abdominal pain.  Has your drinking ever caused you problems, such as problems with your family, problems with your work (or school) performance, or accidents/injuries?  Do you have a compulsion to drink or a preoccupation with drinking?  Do you have poor control or are you unable to stop drinking once you have started?  Do you have to drink to avoid withdrawal symptoms?  Do you have problems with withdrawal such as tremors, nausea, sweats, or mood disturbances?  Does it take more alcohol than in the past to get you high?  Do you feel a strong urge to drink?  Do you change your plans so that you can have a drink?  Do you ever drink in the morning to relieve the shakes or a hangover? If you have answered a number of the previous questions positively, it may be time for you to talk to your caregivers, family, and friends and see if they think you have a problem. Alcoholism is a chemical dependency that keeps getting worse and will eventually destroy your health and relationships. Many alcoholics end up dead, impoverished, or in prison. This is often the end result of all chemical dependency.  Do not be discouraged if you are not ready to take  action immediately.  Decisions to change behavior often involve up and down desires to change and feeling like you cannot decide.  Try to think more seriously about your drinking behavior.  Think of the reasons to quit. WHERE TO GO FOR ADDITIONAL INFORMATION   The [place] on Alcohol Abuse and Alcoholism (NIAAA) http://www.bradshaw.com/  [employer] on Alcoholism and Drug Dependence (NCADD) www.ncadd.[place] (ASAM) http://carpenter.net/  Document Released: 03/01/2005 Document Revised: 05/24/2011 Document Reviewed: 10/18/2007 [facility] Patient Information 2014 [place].    Cocaine Abuse and Chemical Dependency WHEN IS DRUG USE A PROBLEM? Anytime drug use is interfering with normal living activities it has become abuse. This includes problems with family and friends. Psychological dependence has developed when your mind tells you that the drug is needed. This is usually followed by physical dependence which has developed when  continuing increases of drug are required to get the same feeling or "high". This is known as addiction or chemical dependency. A person's risk is much higher if there is a history of chemical dependency in the family. SIGNS OF CHEMICAL DEPENDENCY:  Been told by friends or family that drugs have become a problem.  Fighting when using drugs.  Having blackouts (not remembering what you do while using).  Feel sick from using drugs but continue using.  Lie about use or amounts of drugs (chemicals) used.  Need chemicals to get you going.  Suffer in work Systems analyst or school because of drug use.  Get sick from use of drugs but continue to use anyway.  Need drugs to relate to people or feel comfortable in social situations.  Use drugs to forget problems. Yes answered to any of the above signs of chemical dependency indicates there are problems. The longer the use of drugs continues, the greater the problems will  become. If there is a family history of drug or alcohol use it is best not to experiment with these drugs. Experimentation leads to tolerance and needing to use more of the drug to get the same feeling. This is followed by addiction where drugs become the most important part of life. It becomes more important to take drugs than participate in the other usual activities of life including relating to friends and family. Addiction is followed by dependency where drugs are now needed not just to get high but to feel normal. Addiction cannot be cured but it can be stopped. This often requires outside help and the care of professionals. Treatment centers are listed in the yellow pages under: Cocaine, Narcotics, and Alcoholics Anonymous. Most hospitals and clinics can refer you to a specialized care center. WHAT IS COCAINE? Cocaine is a strong nervous system stimulant which speeds up the body and gives the user the feeling that they have increased energy, loss of appetite and feelings of great pleasure. This "high" which begins within several minutes and lasts for less than an hour is followed by a "crash". The crash and depressed feelings that come with it cause a craving for the drug to regain the high. HOW IS COCANINE USED? Cocaine is snorted, injected, and smoked as free- base or crack. Because smoking the drug produces a greater high it is also associated with a greater low. It is therefore more rapidly addicting. WHAT ARE THE EFFECTS OF COCAINE? It is an anesthetic (pain killer) and a stimulant (it causes a high which gives a false feeling of well being). It increases heart and breathing rates with increases in body temperature and blood pressure. It removes appetite. It causes seizures (convulsions) along with nausea (feeling sick to your stomach), vomiting and stomach pain. This dangerous combination can lead to death. Trying to keep the high feeling leads to greater and greater drug use and this leads to  addiction. Addiction can only be helped by stopping use of all chemicals. This is hard but may save your life. If the addiction is continued, the only possible outcome is loss of self respect and self esteem, violence, death, and eventually prison if the addict is fortunate enough to be caught and able to receive help prior to this last life ending event. OTHER HEALTH RISKS OF COCAINE AND ALL DRUG USE ARE:  The increased possibility of getting AIDS or hepatitis (liver inflammation).  Having a baby born which is addicted to cocaine and must go through painful withdrawal including shaking,  jerking, and crying in pain. Many of the babies die. Other babies go through life with lifelong disabilities and learning problems. HOW TO STAY DRUG FREE ONCE YOU HAVE QUIT USING:  Develop healthy activities and form friends who do not use drugs.  Stay away from the drug scene.  Tell the pusher or former friend you have other better things to do.  Have ready excuses available about why you cannot use. For more help or information contact your local physician, clinic, hospital or dial 1-800-cocaine [phone]). Document Released: 02/27/2000 Document Revised: 05/24/2011 Document Reviewed: 10/18/2007 [facility] Patient Information 2014 [place].    Mood Disorders Mood disorders are conditions that affect the way a person feels emotionally. The main mood disorders include:  Depression.  Bipolar disorder.  Dysthymia. Dysthymia is a mild, lasting (chronic) depression. Symptoms of dysthymia are similar to depression, but not as severe.  Cyclothymia. Cyclothymia includes mood swings, but the highs and lows are not as severe as they are in bipolar disorder. Symptoms of cyclothymia are similar to those of bipolar disorder, but less extreme. CAUSES  Mood disorders are probably caused by a combination of factors. People with mood disorders seem to have physical and chemical changes in their brains.  Mood disorders run in families, so there may be genetic causes. Severe trauma or stressful life events may also increase the risk of mood disorders.  SYMPTOMS  Symptoms of mood disorders depend on the specific type of condition. Depression symptoms include:  Feeling sad, worthless, or hopeless.  Negative thoughts.  Inability to enjoy one's usual activities.  Low energy.  Sleeping too much or too little.  Appetite changes.  Crying.  Concentration problems.  Thoughts of harming oneself. Bipolar disorder symptoms include:  Periods of depression (see above symptoms).  Mood swings, from sadness and depression, to abnormal elation and excitement.  Periods of mania:  Racing thoughts.  Fast speech.  Poor judgment, and careless, dangerous choices.  Decreased need for sleep.  Risky behavior.  Difficulty concentrating.  Irritability.  Increased energy.  Increased sex drive. DIAGNOSIS  There are no blood tests or X-rays that can confirm a mood disorder. However, your caregiver may choose to run some tests to make sure that there is not another physical cause for your symptoms. A mood disorder is usually diagnosed after an in-depth interview with a caregiver. TREATMENT  Mood disorders can be treated with one or more of the following:  Medicine. This may include antidepressants, mood-stabilizers, or anti-psychotics.  Psychotherapy (talk therapy).  Cognitive behavioral therapy. You are taught to recognize negative thoughts and behavior patterns, and replace them with healthy thoughts and behaviors.  Electroconvulsive therapy. For very severe cases of deep depression, a series of treatments in which an electrical current is applied to the brain.  Vagus nerve stimulation. A pulse of electricity is applied to a portion of the brain.  Transcranial magnetic stimulation. Powerful magnets are placed on the head that produce electrical currents.  Hospitalization. In severe  situations, or when someone is having serious thoughts of harming him or herself, hospitalization may be necessary in order to keep the person safe. This is also done to quickly start and monitor treatment. HOME CARE INSTRUCTIONS   Take your medicine exactly as directed.  Attend all of your therapy sessions.  Try to eat regular, healthy meals.  Exercise daily. Exercise may improve mood symptoms.  Get good sleep.  Do not drink alcohol or use pot or other drugs. These can worsen mood symptoms and  cause anxiety and psychosis.  Tell your caregiver if you develop any side effects, such as feeling sick to your stomach (nauseous), dry mouth, dizziness, constipation, drowsiness, tremor, weight gain, or sexual symptoms. He or she may suggest things you can do to improve symptoms.  Learn ways to cope with the stress of having a chronic illness. This includes yoga, meditation, tai chi, or participating in a support group.  Drink enough water to keep your urine clear or pale yellow. Eat a high-fiber diet. These habits may help you avoid constipation from your medicine. SEEK IMMEDIATE MEDICAL CARE IF:  Your mood worsens.  You have thoughts of hurting yourself or others.  You cannot care for yourself.  You develop the sensation of hearing or seeing something that is not actually present (auditory or visual hallucinations).  You develop abnormal thoughts. Document Released: 12/27/2008 Document Revised: 05/24/2011 Document Reviewed: 12/27/2008 [facility] Patient Information 2014 [place], [place].     Emergency Department Resource Guide 1) Find a Doctor and Pay Out of Pocket Although you won't have to find out who is covered by your insurance plan, it is a good idea to ask around and get recommendations. You will then need to call the office and see if the doctor you have chosen will accept you as a new patient and what types of options they offer for patients who are self-pay. Some doctors offer  discounts or will set up payment plans for their patients who do not have insurance, but you will need to ask so you aren't surprised when you get to your appointment.  2) Contact Your Local Health Department Not all health departments have doctors that can see patients for sick visits, but many do, so it is worth a call to see if yours does. If you don't know where your local health department is, you can check in your phone book. The CDC also has a tool to help you locate your state's health department, and many state websites also have listings of all of their local health departments.  3) Find a [facility] If your illness is not likely to be very severe or complicated, you may want to try a walk in clinic. These are popping up all over the country in pharmacies, drugstores, and shopping centers. They're usually staffed by nurse practitioners or physician assistants that have been trained to treat common illnesses and complaints. They're usually fairly quick and inexpensive. However, if you have serious medical issues or chronic medical problems, these are probably not your best option.  No Primary Care Doctor: - Call Health Connect at  [phone] - they can help you locate a primary care doctor that  accepts your insurance, provides certain services, etc. - Physician Referral Service- [phone]  Chronic Pain Problems: Organization         Address  Phone   Notes  [facility]  [phone] Patients need to be referred by their primary care doctor.   Medication Assistance: Organization         Address  Phone   Notes  [facility] Medication [facility] [address] [address] [place], [place] [address] [phone] --Must be a resident of [facility] -- Must have NO insurance coverage whatsoever (no Medicaid/ Medicare, etc.) -- The pt. MUST have a primary care doctor that directs their care regularly and follows them in the community   [facility]   [phone]   [employer]  [phone]    Agencies  that provide inexpensive medical care: Organization         Address  Phone   Notes  [place]  [phone]   [facility]    [phone]   [facility] [place], [place] [address] [phone]   [place] [address], [place] [phone]   [facility]anned Parenthood    [phone]   [facility]    [phone]   [place] and [place] [address], [place] Phone:  [phone], Fax:  [phone] Hours of Operation:  9 am - 6 pm, M-F.  Also accepts Medicaid/Medicare and self-pay.  [facility] for [place] [address], [address], [place] Phone: [phone], Fax: [phone]. Hours of Operation:  8:30 am - 5:30 pm, M-F.  Also accepts Medicaid and self-pay.  [facility] [address] [place] Phone: [phone]   [place], [place], [place] [phone], Ext. [phone] Mondays & Thursdays: 7-9 AM.  First 15 patients are seen on a first come, first serve basis.    [place] Providers:  Organization         Address  Phone   Notes  [facility] [address], [place] [phone] Also accepts self-pay patients.  [facility] [address], [place]  [phone]   [place], [address], [place] [phone]   [facility] [address] [place] [phone]   Lucianne Lei [address], [place]   [phone] Only accepts [place] [facility] [place] patients after they have their name applied to their card.   Self-Pay (no insurance) in [facility]:  Organization         Address  Phone   Notes  Sickle Cell Patients, [facility] [place] [phone]   [facility] [place] [phone]   [facility] [place]  [place], [place], [place] [phone]   [facility]/Dr. Osei-Bonsu  [address], [place] or [address], [place] [phone] Phone number for both [place] and [place] locations is the same.  Urgent Medical and [facility] [address] [place] [phone]   [facility] [address] [place] or [address] [phone]   [facility] [address], [place] [phone], phone; [phone], fax Sees patients 1st and 3rd Saturday of every month.  Must not qualify for public or private insurance (i.e. Medicaid, Medicare, [place] Health Choice, Veterans' Benefits)  Household income should be no more than 200% of the poverty level The clinic cannot treat you if you are pregnant or think you are pregnant  Sexually transmitted diseases are not treated at the clinic.    Dental Care: Organization         Address  Phone  Notes  [facility] [facility] Department of [facility] [phone] Accepts children up to age 43 who are enrolled in [place] or [place]; pregnant women with a Medicaid card; and children who have applied for Medicaid or [place]  Choice, but were declined, whose parents can pay a reduced fee at time of service.  [facility] Department of [facility]  [address], [place] [phone] Accepts children up to age 32 who are enrolled in [place] or [place]; pregnant women with a Medicaid card; and children who have applied for Medicaid or [place] [employer] Choice, but were declined, whose parents can pay a reduced fee at time of service.  [place] Adult [facility] Access PROGRAM  [place]  [phone] Patients are seen by appointment only. Walk-ins are not accepted. [place] will see patients 1 years of age and older. Monday - Tuesday (8am-5pm) Most Wednesdays (8:30-5pm) $30 per visit, cash only  [facility] Adult Dental Access PROGRAM  [address], [facility] [phone] Patients are seen by appointment only. Walk-ins are not accepted. [place] will see patients 55 years of age and older. One Wednesday Evening (Monthly: Volunteer Based).  $30 per visit, cash only  [place]  [phone] for adults; Children under age 71, call Graduate Pediatric Dentistry at [phone]. Children aged 50-14, please call [phone] to request a pediatric application.  Dental services are provided in all areas of dental care including fillings, crowns and bridges, complete and partial dentures, implants, gum treatment, root canals, and extractions. Preventive care is also provided. Treatment is provided to both adults and children. Patients are selected via a lottery and there is often a waiting list.   [facility] [address], [place]  [phone] [email or web address]   [facility] [address] [place], [place] [phone], Ext. [phone] Second and Fourth Thursday of each month, opens at 6:30 AM; Clinic ends at 9 AM.  Patients are seen on a first-come first-served basis, and a limited number are seen during each clinic.   [facility]  [address] Hillard Danker [place], [place] [phone]   Eligibility Requirements You must have lived in [place], [place], or [place] counties for at least the last three months.   You cannot be eligible for state or federal sponsored [employer], including [employer], [place], or [employer].   You generally cannot be eligible for healthcare insurance through your employer.    How to apply: Eligibility screenings are held every Tuesday and Wednesday  afternoon from 1:00 pm until 4:00 pm. You do not need an appointment for the interview!  [facility] [address] [place], [place]   [place]  [place] Department  [place]  [phone]    Behavioral Health Resources in the Community: Intensive Outpatient Programs Organization         Address  Phone  Notes  [place]. [address], [place], [place] [phone]   [facility] Outpatient [address] [place], [place]   ADS: Alcohol [facility] Drug Sv[facility] [address], [place], [place]   [place] [address],  [place], [place] or [phone]   Substance Abuse Resources Organization         Address  Phone  Notes  Alcohol and Drug Services  [phone]   [facility]  [phone]   [facility] [place]  [phone]   Chinita Pester  [phone]   Residential & Outpatient Substance Abuse Program  [phone]  Psychological Services Organization         Address  Phone  Notes  [facility] [place]  [place]  [phone]   [place] [address] [place] or [phone]    Mobile Crisis Teams Organization         Address  Phone  Notes  [facility], Mobile Crisis Care Unit  [phone]   [facility]  [address] [place], [place]   [place] Levels [address] [place] [phone]    Self-Help/Support Groups Organization         Address  Phone             Notes  [place] - variety of support groups  [place] Call for more information  Narcotics Anonymous (NA), [facility] [address], [employer] [place]  2 meetings at this location   Materials engineer         Address  Phone  Notes  [facility] [place],    [place]  [phone]   [facility]  [address] [place] [address], [place], [place]   [place], [place] [phone] Admissions: 8am-3pm M-F  Incentives Substance [place] [address]    [place], [place] [phone]   [facility] [address] [place], [place], [place]   [facility] [address]  [place], [place]   [facility] - Intensive Outpatient [address] Kristeen [address], [place], [place]   [facility] ([place] [address]  [place], [place] [phone] or [phone]   [facility] (RTS) [address] [place], [place] Accepts Medicaid  [facility] [address],  [place] [phone] Substance Abuse/Addiction Treatment   [facility] Organization         Address  Phone  Notes  [facility]  [phone]   Domenic Schwab, PhD [address] Arlis Porta [place], [place]   [phone] or [phone]   [place], [place] [phone]   [name] [address] [place], [place] [phone] Insurance/Medicaid/sponsorship through [facility] and Families [address], [address] [place]                                    [place], [place] [phone] [place] [address], [place] [phone]    Dr. Adele Schilder  [phone]   [facility] Dept. 1) [address] [place] 2) [place] 3)  [place] [address], [place] [phone]  [phone]   [place] [phone] or [phone] (After Hours)

## 2013-04-25 ENCOUNTER — Emergency Department (HOSPITAL_COMMUNITY)
Admission: EM | Admit: 2013-04-25 | Discharge: 2013-04-26 | Disposition: A | Discharge status: Home / Self Care | Payer: Self-pay | Attending: Emergency Medicine | Admitting: Emergency Medicine

## 2013-04-25 ENCOUNTER — Encounter (HOSPITAL_COMMUNITY): Payer: Self-pay | Admitting: Emergency Medicine

## 2013-04-25 DIAGNOSIS — F1010 Alcohol abuse, uncomplicated: Secondary | ICD-10-CM | POA: Insufficient documentation

## 2013-04-25 DIAGNOSIS — T1491XA Suicide attempt, initial encounter: Secondary | ICD-10-CM

## 2013-04-25 DIAGNOSIS — E872 Acidosis, unspecified: Secondary | ICD-10-CM

## 2013-04-25 DIAGNOSIS — F1410 Cocaine abuse, uncomplicated: Secondary | ICD-10-CM | POA: Insufficient documentation

## 2013-04-25 DIAGNOSIS — T394X2A Poisoning by antirheumatics, not elsewhere classified, intentional self-harm, initial encounter: Secondary | ICD-10-CM | POA: Insufficient documentation

## 2013-04-25 DIAGNOSIS — T398X2A Poisoning by other nonopioid analgesics and antipyretics, not elsewhere classified, intentional self-harm, initial encounter: Secondary | ICD-10-CM

## 2013-04-25 DIAGNOSIS — R45851 Suicidal ideations: Secondary | ICD-10-CM | POA: Insufficient documentation

## 2013-04-25 DIAGNOSIS — F10129 Alcohol abuse with intoxication, unspecified: Secondary | ICD-10-CM

## 2013-04-25 DIAGNOSIS — F919 Conduct disorder, unspecified: Secondary | ICD-10-CM | POA: Insufficient documentation

## 2013-04-25 DIAGNOSIS — F1020 Alcohol dependence, uncomplicated: Secondary | ICD-10-CM

## 2013-04-25 DIAGNOSIS — F3289 Other specified depressive episodes: Secondary | ICD-10-CM | POA: Insufficient documentation

## 2013-04-25 DIAGNOSIS — F10929 Alcohol use, unspecified with intoxication, unspecified: Secondary | ICD-10-CM

## 2013-04-25 DIAGNOSIS — F329 Major depressive disorder, single episode, unspecified: Secondary | ICD-10-CM

## 2013-04-25 DIAGNOSIS — T398X1A Poisoning by other nonopioid analgesics and antipyretics, not elsewhere classified, accidental (unintentional), initial encounter: Secondary | ICD-10-CM | POA: Insufficient documentation

## 2013-04-25 DIAGNOSIS — F1910 Other psychoactive substance abuse, uncomplicated: Secondary | ICD-10-CM

## 2013-04-25 DIAGNOSIS — F17200 Nicotine dependence, unspecified, uncomplicated: Secondary | ICD-10-CM | POA: Insufficient documentation

## 2013-04-25 DIAGNOSIS — F1994 Other psychoactive substance use, unspecified with psychoactive substance-induced mood disorder: Secondary | ICD-10-CM

## 2013-04-25 LAB — RAPID URINE DRUG SCREEN, HOSP PERFORMED
Amphetamines: NOT DETECTED
BENZODIAZEPINES: NOT DETECTED
Barbiturates: NOT DETECTED
COCAINE: POSITIVE — AB
OPIATES: NOT DETECTED
Tetrahydrocannabinol: NOT DETECTED

## 2013-04-25 LAB — ACETAMINOPHEN LEVEL: Acetaminophen (Tylenol), Serum: <15 ug/mL (ref 10–30)

## 2013-04-25 LAB — COMPREHENSIVE METABOLIC PANEL
ALK PHOS: 68 U/L (ref 39–117)
ALT: 21 U/L (ref 0–53)
AST: 25 U/L (ref 0–37)
Albumin: 4.1 g/dL (ref 3.5–5.2)
BUN: 6 mg/dL (ref 6–23)
CHLORIDE: 100 meq/L (ref 96–112)
CO2: 26 meq/L (ref 19–32)
Calcium: 8.9 mg/dL (ref 8.4–10.5)
Creatinine, Ser: 0.8 mg/dL (ref 0.50–1.35)
GFR calc Af Amer: >90 mL/min (ref >90)
GFR calc non Af Amer: >90 mL/min (ref >90)
GLUCOSE: 90 mg/dL (ref 70–99)
POTASSIUM: 4.1 meq/L (ref 3.7–5.3)
Sodium: 141 mEq/L (ref 137–147)
Total Bilirubin: 0.4 mg/dL (ref 0.3–1.2)
Total Protein: 8.2 g/dL (ref 6.0–8.3)

## 2013-04-25 LAB — CBC
HEMATOCRIT: 44 % (ref 39.0–52.0)
HEMOGLOBIN: 15.7 g/dL (ref 13.0–17.0)
MCH: 30.7 pg (ref 26.0–34.0)
MCHC: 35.7 g/dL (ref 30.0–36.0)
MCV: 86.1 fL (ref 78.0–100.0)
Platelets: 217 10*3/uL (ref 150–400)
RBC: 5.11 MIL/uL (ref 4.22–5.81)
RDW: 14.9 % (ref 11.5–15.5)
WBC: 9.9 10*3/uL (ref 4.0–10.5)

## 2013-04-25 LAB — ETHANOL: Alcohol, Ethyl (B): 186 mg/dL — ABNORMAL HIGH (ref 0–11)

## 2013-04-25 LAB — GLUCOSE, CAPILLARY: GLUCOSE-CAPILLARY: 98 mg/dL (ref 70–99)

## 2013-04-25 LAB — SALICYLATE LEVEL: Salicylate Lvl: <2 mg/dL — ABNORMAL LOW (ref 2.8–20.0)

## 2013-04-25 LAB — TROPONIN I: Troponin I: <0.3 ng/mL (ref <0.30)

## 2013-04-25 NOTE — ED Notes (Signed)
Patient observed in bed. Denies SI, HI, AVH. Patient appears sad, withdrawn. Encouragement offered. Patient safety maintained, Q 15 checks continue.

## 2013-04-25 NOTE — ED Notes (Signed)
Pt gowned and wanded.

## 2013-04-25 NOTE — ED Notes (Signed)
Pt transferred from main ed, report from [facility] [place], presents with SI, pt ingested 30 percocet, smoked crack and drank 9/40 oz beers.  Pt denies HI or AV hallucinations.  Complaint of rt abdominal pain.  Pt calm & cooperative at present.

## 2013-04-25 NOTE — Consult Note (Signed)
  Review of Systems  Constitutional: Negative.   HENT: Negative.   Eyes: Negative.   Respiratory: Negative.   Cardiovascular: Negative.   Gastrointestinal: Positive for abdominal pain.  Genitourinary: Negative.   Musculoskeletal: Negative.   Skin: Negative.   Neurological: Negative.   Endo/Heme/Allergies: Negative.   Psychiatric/Behavioral: Positive for depression, suicidal ideas and substance abuse.

## 2013-04-25 NOTE — ED Notes (Signed)
PT arrives to ER via EMS for evaluation of suicidal thoughts; pt states that he was just released a few hours ago but states he was unable to cope and felt the need to overdose; pt states that he drank 9 40oz beers, took 30 percocets and smoked crack; pt states his plan was to overdose; pt croggy in triage and slurring his words; pt states that he is supposed to go to a 2 yr program in [place] on Friday and that he needs to be there by then.

## 2013-04-25 NOTE — Progress Notes (Signed)
[facility] provided pt with a [employer] Orange Card application, highlighting [facility].  °
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
## 2013-04-25 NOTE — ED Provider Notes (Signed)
Medical screening examination/treatment/procedure(s) were performed by non-physician practitioner and as supervising physician I was immediately available for consultation/collaboration.   Aleighna Wojtas, MD 04/25/13 0723 

## 2013-04-26 NOTE — Consult Note (Signed)
.  Review of Systems  Constitutional: Negative.   HENT: Negative.   Eyes: Negative.   Respiratory: Negative.   Cardiovascular: Negative.   Gastrointestinal: Negative.   Musculoskeletal: Negative.   Skin: Negative.   Endo/Heme/Allergies: Negative.   Psychiatric/Behavioral: Positive for depression and substance abuse.   No complaints

## 2013-04-26 NOTE — Plan of Care (Signed)
[facility]   Reason for Crisis Plan:  Chronic Mental Illness/Medical Illness and Substance Abuse   Plan of Care:  Referral for Substance Abuse  Family Support:    patient has strong support from girlfriend   Current Living Environment:    lives with girlfriend.   Insurance:   Hospital Account   Name Acct ID Class Status Primary Coverage   Kathreen DevoidLester, Tyshun [id number] Emergency Discharged/Not Billed None        Guarantor Account (for Hospital Account [id number])   Name Relation to Pt Service Area Active? Acct Type   Kathreen DevoidLester, Akshar Self CHSA Yes Personal/Family   Address Phone       [address] [place], [place] [address] [phone](H)          Coverage Information (for Hospital Account [id number])   Not on file      Legal Guardian:     None   Primary Care Provider:  No PCP Per Patient  Current Outpatient Providers:  [facility]  Psychiatrist:    None   Counselor/Therapist:    None   Compliant with Medications:  No  Additional Information:  Pt currently working with [facility], has phone interview on Friday. Patient lives with girlfriend however will kick patient out when drunk. Patient frequents the ED's due to alcohol and SI. Plan is to allow patient to sober, assess, and encourage patient to follow up with outpatient resources for SA if psychiatrically stable. Patient back up [facility].    Olga CoasterREED, Farrell Pantaleo A 2/12/20153:54 PM

## 2013-04-26 NOTE — Consult Note (Signed)
  Psychiatric Specialty Exam: Physical Exam  ROS  Blood pressure 125/84, pulse 58, temperature 97.7 F (36.5 C), temperature source Oral, resp. rate 18, SpO2 97.00%.There is no weight on file to calculate BMI.  General Appearance: Casual  Eye Contact::  Good  Speech:  Clear and Coherent  Volume:  Normal  Mood:  Euthymic  Affect:  Appropriate and Congruent  Thought Process:  Goal Directed and Logical  Orientation:  Full (Time, Place, and Person)  Thought Content:  Negative  Suicidal Thoughts:  No  Homicidal Thoughts:  No  Memory:  Immediate;   Good Recent;   Good Remote;   Good  Judgement:  Intact  Insight:  Fair  Psychomotor Activity:  Normal  Concentration:  Good  Recall:  Good  Akathisia:  Negative  Handed:  Right  AIMS (if indicated):     Assets:  Communication Skills Desire for Improvement Housing Intimacy Physical Health Social Support  Sleep:   okay sleep last night   Mr Adam [name] looks much better.  Says he is not suicidal and has called his girlfriend who will be home today and that should keep him away from the drugs and alcohol at least until he [facility] at 10 a.m. Tomorrow.  Will discharge home today.  Diagnosis remains Substance induced mood disorder disorder and alcohol dependence and cocaine dependence.

## 2013-04-26 NOTE — Progress Notes (Signed)
Per, Dr. Ladona Ridgelaylor the patient is psychiatrically stable for discharge.  Writer provided resources for the patient.

## 2013-04-27 ENCOUNTER — Emergency Department (HOSPITAL_COMMUNITY)
Admission: EM | Admit: 2013-04-27 | Discharge: 2013-04-30 | Disposition: A | Discharge status: Home / Self Care | Payer: Self-pay | Attending: Emergency Medicine | Admitting: Emergency Medicine

## 2013-04-27 ENCOUNTER — Encounter (HOSPITAL_COMMUNITY): Payer: Self-pay | Admitting: Emergency Medicine

## 2013-04-27 DIAGNOSIS — R5383 Other fatigue: Secondary | ICD-10-CM

## 2013-04-27 DIAGNOSIS — R5381 Other malaise: Secondary | ICD-10-CM | POA: Insufficient documentation

## 2013-04-27 DIAGNOSIS — F10929 Alcohol use, unspecified with intoxication, unspecified: Secondary | ICD-10-CM

## 2013-04-27 DIAGNOSIS — F329 Major depressive disorder, single episode, unspecified: Secondary | ICD-10-CM

## 2013-04-27 DIAGNOSIS — IMO0001 Reserved for inherently not codable concepts without codable children: Secondary | ICD-10-CM | POA: Insufficient documentation

## 2013-04-27 DIAGNOSIS — Z8659 Personal history of other mental and behavioral disorders: Secondary | ICD-10-CM | POA: Insufficient documentation

## 2013-04-27 DIAGNOSIS — F1020 Alcohol dependence, uncomplicated: Secondary | ICD-10-CM | POA: Insufficient documentation

## 2013-04-27 DIAGNOSIS — F1410 Cocaine abuse, uncomplicated: Secondary | ICD-10-CM

## 2013-04-27 DIAGNOSIS — F10129 Alcohol abuse with intoxication, unspecified: Secondary | ICD-10-CM

## 2013-04-27 DIAGNOSIS — F17200 Nicotine dependence, unspecified, uncomplicated: Secondary | ICD-10-CM | POA: Insufficient documentation

## 2013-04-27 DIAGNOSIS — F1994 Other psychoactive substance use, unspecified with psychoactive substance-induced mood disorder: Secondary | ICD-10-CM

## 2013-04-27 LAB — CBC
HEMATOCRIT: 44.3 % (ref 39.0–52.0)
Hemoglobin: 16 g/dL (ref 13.0–17.0)
MCH: 30.8 pg (ref 26.0–34.0)
MCHC: 36.1 g/dL — AB (ref 30.0–36.0)
MCV: 85.4 fL (ref 78.0–100.0)
PLATELETS: 240 10*3/uL (ref 150–400)
RBC: 5.19 MIL/uL (ref 4.22–5.81)
RDW: 14.9 % (ref 11.5–15.5)
WBC: 9.5 10*3/uL (ref 4.0–10.5)

## 2013-04-27 LAB — RAPID URINE DRUG SCREEN, HOSP PERFORMED
Amphetamines: NOT DETECTED
Barbiturates: NOT DETECTED
Benzodiazepines: NOT DETECTED
COCAINE: POSITIVE — AB
OPIATES: NOT DETECTED
Tetrahydrocannabinol: NOT DETECTED

## 2013-04-27 LAB — COMPREHENSIVE METABOLIC PANEL
ALBUMIN: 3.9 g/dL (ref 3.5–5.2)
ALT: 27 U/L (ref 0–53)
AST: 45 U/L — ABNORMAL HIGH (ref 0–37)
Alkaline Phosphatase: 73 U/L (ref 39–117)
BUN: 14 mg/dL (ref 6–23)
CO2: 21 mEq/L (ref 19–32)
CREATININE: 0.92 mg/dL (ref 0.50–1.35)
Calcium: 9 mg/dL (ref 8.4–10.5)
Chloride: 97 mEq/L (ref 96–112)
GFR calc Af Amer: >90 mL/min (ref >90)
GFR calc non Af Amer: >90 mL/min (ref >90)
Glucose, Bld: 87 mg/dL (ref 70–99)
Potassium: 4.1 mEq/L (ref 3.7–5.3)
Sodium: 139 mEq/L (ref 137–147)
TOTAL PROTEIN: 8.2 g/dL (ref 6.0–8.3)
Total Bilirubin: 0.4 mg/dL (ref 0.3–1.2)

## 2013-04-27 LAB — SALICYLATE LEVEL: Salicylate Lvl: <2 mg/dL — ABNORMAL LOW (ref 2.8–20.0)

## 2013-04-27 LAB — ETHANOL: ALCOHOL ETHYL (B): 97 mg/dL — AB (ref 0–11)

## 2013-04-27 LAB — ACETAMINOPHEN LEVEL

## 2013-04-27 MED ORDER — ACETAMINOPHEN 325 MG PO TABS: 650.0000 mg | ORAL_TABLET | ORAL | Status: DC | PRN

## 2013-04-27 MED ORDER — IBUPROFEN 200 MG PO TABS: 600.0000 mg | ORAL_TABLET | Freq: Three times a day (TID) | ORAL | Status: DC | PRN

## 2013-04-27 MED ORDER — NICOTINE 21 MG/24HR TD PT24
21.0000 mg | MEDICATED_PATCH | Freq: Every day | TRANSDERMAL | Status: DC
  Filled 2013-04-27 (×2): qty 1

## 2013-04-27 MED ORDER — ALUM & MAG HYDROXIDE-SIMETH 200-200-20 MG/5ML PO SUSP: 30.0000 mL | ORAL | Status: DC | PRN

## 2013-04-27 MED ORDER — ONDANSETRON HCL 4 MG PO TABS: 4.0000 mg | ORAL_TABLET | Freq: Three times a day (TID) | ORAL | Status: DC | PRN

## 2013-04-27 MED ORDER — SODIUM CHLORIDE 0.9 % IV BOLUS (SEPSIS): 1000.0000 mL | Freq: Once | INTRAVENOUS | Status: DC

## 2013-04-27 MED ORDER — ZOLPIDEM TARTRATE 5 MG PO TABS: 5.0000 mg | ORAL_TABLET | Freq: Every evening | ORAL | Status: DC | PRN

## 2013-04-27 NOTE — ED Notes (Signed)
Pt refused Nicotine patch.

## 2013-04-27 NOTE — ED Provider Notes (Signed)
Medical screening examination/treatment/procedure(s) were conducted as a shared visit with non-physician practitioner(s) and myself.  I personally evaluated the patient during the encounter.  EKG Interpretation    Date/Time:  Friday April 27 2013 20:38:05 EST Ventricular Rate:  114 PR Interval:  142 QRS Duration: 80 QT Interval:  342 QTC Calculation: 471 R Axis:   73 Text Interpretation:  Sinus tachycardia Right atrial enlargement Nonspecific T wave abnormality Abnormal ECG No significant change since March 2014 Confirmed by Jadarian Mckay  DO, Skarlett Sedlacek ([id number]) on 04/27/2013 9:46:50 PM           Pt is a 28 y.o. M with history of depression, polysubstance abuse who is a frequent visitor to the emergency department for suicidal ideation. Patient reports that he has been drinking over the past several days and using cocaine. [facility] and discharged home with plans to followup with [facility].  Patient is here with SI without a plan. No HI or hallucinations. He is not intoxicated, his alcohol level several hours ago was 97. He is unable to contract for safety. His labs are unremarkable. Urine drug screen is positive for cocaine. Will discuss with psychiatry. Patient is here voluntarily.  Layla MawKristen N Shabre Kreher, DO 04/27/13 2325

## 2013-04-27 NOTE — ED Notes (Signed)
Pt states he cant sleep at night.

## 2013-04-27 NOTE — ED Notes (Signed)
Pt was wanded by security in triage.  

## 2013-04-27 NOTE — BH Assessment (Signed)
Assessment complete. [facility], adult unit is at capacity. Consulted with AlbRosey Batherteen SamFran Hobson, NP who agrees Pt meets criteria for inpatient treatment. TTS will contact other facilities for placement. Notified Rudene AndaJacob Gray Lackey, PA-C and Roseanne RenoAnna Reabold, RN of disposition.  Harlin RainFord Ellis Ria CommentWarrick Jr, LPC, [facility] Triage Specialist

## 2013-04-27 NOTE — BH Assessment (Addendum)
Attempted tele-assessment. A different Pt is currently being assessed using tele-med cart 2 and staff says they cannot locate tele-med cart 1. Attempted to connect to tele-med cart 1 to determine its location but tele-med cart 1 does not appear connected. Pt will be assessed when tele-med cart 2 is available.  Adam [name] Ellis Ria CommentWarrick Jr, LPC, [facility] Triage Specialist

## 2013-04-27 NOTE — ED Notes (Signed)
PA at bedside.
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
## 2013-04-27 NOTE — ED Notes (Addendum)
Pt reports being suicidal.  States he has drank over 22 40oz beer today, smoked crack, and took over 14 Percocet today.  [facility]  Pt states, "If I get discharged this time, I am going to kill myself."  C/o generalized abd pain.

## 2013-04-27 NOTE — ED Notes (Signed)
Pt states he has been feeling generally depressed due to his mom being sick and in the hospital. He states she does not recognize him and it causes him to drink and do drugs. Pt states he realizes he needs to quit.

## 2013-04-27 NOTE — BH Assessment (Signed)
Received call for assessment. Spoke to Rudene AndaJacob Gray Lackey, PA-C who says Pt was recently discharged from treatment and relapsed on cocaine, alcohol and percocet. Tele-assessment will be initiated.  Harlin RainFord Ellis Ria CommentWarrick Jr, LPC, [facility] Triage Specialist
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
## 2013-04-28 NOTE — Progress Notes (Signed)
Met patient at bedside to assess for any potential Case Manager needs.CM role explained to patient.Patient reports he did not follow up with any CM resources provided last week.CM educated  Patient of importance of establishing Primary Care Services.Resource sheet provided to patient for the [facility].Patient reports he wants to attend a program at [place].

## 2013-04-28 NOTE — ED Notes (Signed)
Report received from Anna, RN

## 2013-04-28 NOTE — BH Assessment (Signed)
[facility] Assessment Progress Note Pt seen for reassessment @ [phone] by this clinician this day via tele assessment after appt scheduled with pt's nurse, Clydie BraunKaren.  Pt continues to report SI and is requesting detox from alcohol.  Pt stated his current withdrawal sx are sweating and the sides of his body hurting.  Pt stated he did get some sleep last night.  Pt calm, cooperative, oriented x 4, and presents with depressed mood.  Pt denies HI or psychosis.  Pt is currently pending multiple inpatient facilities ([facility], [facility], [facility], [facility]), as there [facility] not [facility] at [facility]. TTS will continue to seek placement for the pt.  Adam LaniusKristen Caydance Kuehnle, MS, [facility] Licensed Professional Counselor Triage Specialist

## 2013-04-28 NOTE — BH Assessment (Signed)
Per Kelly Southard, AC at [facility], adult unit is at capacity. Contacted the following facilities for placement:   [facility]- Bed available. Faxed clinical information.  [facility]- Bed available. Faxed clinical information.  [facility]- Bed available. Faxed clinical information.  [facility]- Bed available. Faxed clinical information.  [facility]- Bed available. Faxed clinical information.   [facility]- At capacity  [facility]- Left voicemail. No response.  [facility]- At capacity  [facility]- At capacity  [facility]- At capacity  [facility]- At capacity  [facility]- At capacity    Adam [name], LPC, NCC  Triage Specialist  

## 2013-04-28 NOTE — ED Notes (Signed)
Met with Baxter Flattery case Freight forwarder and [employer], and discussed pt's care. We talked with Lowry Bowl and she is to set up another telepsych for pt due to pt's desire to go into a long term substance abuse place. Pt states has been here 16 times, he is homeless and when he is using due to the fact his girlfriend will not let him back until he is not using.

## 2013-04-28 NOTE — ED Notes (Signed)
Pt came out asking to use the phone, stating that it was "a very important phone call". Pt was told that he cannot make phone calls after 9 PM, at which the pt became agitated. Pt is now in his room, sitter at bedside.

## 2013-04-28 NOTE — ED Notes (Signed)
Pt up to make a call

## 2013-04-29 ENCOUNTER — Encounter (HOSPITAL_COMMUNITY): Payer: Self-pay | Admitting: Psychiatry

## 2013-04-29 DIAGNOSIS — F1020 Alcohol dependence, uncomplicated: Secondary | ICD-10-CM

## 2013-04-29 DIAGNOSIS — F329 Major depressive disorder, single episode, unspecified: Secondary | ICD-10-CM

## 2013-04-29 DIAGNOSIS — R45851 Suicidal ideations: Secondary | ICD-10-CM

## 2013-04-29 DIAGNOSIS — F10229 Alcohol dependence with intoxication, unspecified: Secondary | ICD-10-CM

## 2013-04-29 DIAGNOSIS — F1010 Alcohol abuse, uncomplicated: Secondary | ICD-10-CM

## 2013-04-29 DIAGNOSIS — F1410 Cocaine abuse, uncomplicated: Secondary | ICD-10-CM

## 2013-04-29 DIAGNOSIS — F1994 Other psychoactive substance use, unspecified with psychoactive substance-induced mood disorder: Secondary | ICD-10-CM

## 2013-04-29 NOTE — ED Notes (Signed)
telepsych completed 

## 2013-04-29 NOTE — BH Assessment (Addendum)
Spoke to Jamison Lord NP after she assessed patient, Jamison recommended inpt treatment for patient. Mariya disposition [facility] made aware of recommendation.   

## 2013-04-29 NOTE — BH Assessment (Signed)
Per Joann Glover, AC at [facility], adult unit is at capacity. Contacted the following facilities for placement:  [facility]: At capacity [facility]: At capacity [facility]: At capacity [facility]: At capacity [facility]: At capacity [facility]: At capacity [facility]: At capacity [facility]: At capacity [facility]: At capacity [facility]: At capacity [facility]: At capacity [facility]: At capacity [facility]: At capacity [facility]: At capacity  Adam [name], LPC, NCC Triage Specialist 
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
## 2013-04-29 NOTE — ED Notes (Signed)
Pt given 2nd meal tray.

## 2013-04-29 NOTE — BH Assessment (Signed)
[facility] Assessment Progress Note   Called EDP Ghim, and he ordered a tele psych for pt for further assessment and evaluation @ 0945.  Tele psych will be scheduled with a TTS extender.  Casimer LaniusKristen Lois Ostrom, MS, [facility] Licensed Professional Counselor Triage Specialist

## 2013-04-30 NOTE — Discharge Instructions (Signed)
°Emergency Department Resource Guide °1) Find a Doctor and Pay Out of Pocket °Although you won't have to find out who is covered by your insurance plan, it is a good idea to ask around and get recommendations. You will then need to call the office and see if the doctor you have chosen will accept you as a new patient and what types of options they offer for patients who are self-pay. Some doctors offer discounts or will set up payment plans for their patients who do not have insurance, but you will need to ask so you aren't surprised when you get to your appointment. ° °2) Contact Your Local Health Department °Not all health departments have doctors that can see patients for sick visits, but many do, so it is worth a call to see if yours does. If you don't know where your local health department is, you can check in your phone book. The CDC also has a tool to help you locate your state's health department, and many state websites also have listings of all of their local health departments. ° °3) Find a Walk-in Clinic °If your illness is not likely to be very severe or complicated, you may want to try a walk in clinic. These are popping up all over the country in pharmacies, drugstores, and shopping centers. They're usually staffed by nurse practitioners or physician assistants that have been trained to treat common illnesses and complaints. They're usually fairly quick and inexpensive. However, if you have serious medical issues or chronic medical problems, these are probably not your best option. ° °No Primary Care Doctor: °- Call [facility] Connect at  832-8000 - they can help you locate a primary care doctor that  accepts your insurance, provides certain services, etc. °- Physician Referral Service- 1-800-533-3463 ° °Chronic Pain Problems: °Organization         Address  Phone   Notes  °[facility]  (336) 297-2271 Patients need to be referred by their primary care doctor.  ° °Medication  Assistance: °Organization         Address  Phone   Notes  °[place] [facility] [address] °[place], [place] [address] (336) 641-8030 --Must be a resident of [place] °-- Must have NO insurance coverage whatsoever (no Medicaid/ Medicare, etc.) °-- The pt. MUST have a primary care doctor that directs their care regularly and follows them in the community °  °[facility]  (866) 331-1348   °[employer]  (888) 892-1162   ° °Agencies that provide inexpensive medical care: °Organization         Address  Phone   Notes  °[facility]  (336) 832-8035   °[facility]    (336) 832-7272   °[facility] [address] °[place], [place] [address] (336) 832-4777   °[facility] [address], °[place] (336) 271-4999   °[facility]    (336) 373-0678   °[facility]    (336) 272-1050   °[facility] ° [address], [place] Phone:  (336) 832-4444, Fax:  (336) 832-4440 Hours of Operation:  9 am - 6 pm, M-F.  Also accepts Medicaid/Medicare and self-pay.  °[facility] ° [address],  Phone: (336) 832-3150, Fax: (336) [phone]. Hours of Operation:  8:30 am - 5:30 pm, M-F.  Also accepts Medicaid and self-pay.  °[facility] [place] [address], [place] Phone: (336) 878-6027   °[facility] [address], [place], [place] (336)723-1848, Ext. 123 Mondays & Thursdays: 7-9 AM.  First 15 patients are seen on a first come, first serve basis. °  ° °Medicaid-accepting [place] Providers: ° °Organization         Address  Phone   Notes  °[facility] [address], [place] (336) 641-2100 Also accepts self-pay patients.  °[facility] [address], [place] ° (336) 856-9996   °[facility] [address], [place]  (336) 288-8857   °[facility] [address] [place] [address], [place] (336) 299-7000   °Veita Bland [address], [place]  ° (336) 373-1557 Only accepts [place] Access Medicaid patients after they have their name applied to their card.  ° °Self-Pay (no insurance) in [place]: ° °Organization         Address  Phone   Notes  °Sickle Cell Patients, [facility] [address], [place] (336) 832-1970   °[facility] [address], [place] (336) 832-4400   °[facility] ° [address], [place] (336) 992-4800   °[facility]/Dr. Osei-Bonsu ° [address] [place] [address], [place] or [address], [place] (336) [phone] Phone number for both [place] and [place] locations is the same.  °[facility] [address], [place] (336) 299-0000   °[facility] [address] [place] [address], [place] or [address] (336) 852-7530 °(336) 878-2260   °[facility] [address], [place] (336) 350-1642, phone; (336) 294-5005, fax Sees patients 1st and 3rd Saturday of every month.  Must not qualify for public or private insurance (i.e. Medicaid, Medicare, [place] Health Choice, Veterans' Benefits) • Household income should be no more than 200% of the poverty level •The clinic cannot treat you if you are pregnant or think you are pregnant • Sexually transmitted diseases are not treated at the clinic.  ° ° °Dental Care: °Organization         Address  Phone  Notes  °[place] [facility] [address],  (336) 641-6152 Accepts children up to age 21 who are enrolled in Medicaid or [place] Health Choice; pregnant women with a Medicaid card; and children who have applied for Medicaid or [place] Health Choice, but were declined, whose parents can pay a reduced fee at time of service.  °[place]  [facility] [place]  [address], [place] (336) 641-7733 Accepts children up to age 21 who are enrolled in Medicaid or [place] Health Choice; pregnant women with a Medicaid card; and children who have applied for Medicaid or  Health Choice, but were declined, whose parents can pay a reduced fee at time of service.  °[facility] ° [address],  (336) 641-4533 Patients are seen by appointment only. Walk-ins are not accepted. [facility] will see patients 18 years of age and older. °Monday - Tuesday (8am-5pm) °Most Wednesdays (8:30-5pm) °$30 per visit, cash only  °[facility] ° [address], [place] (336) 641-4533 Patients are seen by appointment only. Walk-ins are not accepted. [facility] will see patients 18 years of age and older. °One   Wednesday Evening (Monthly: Volunteer Based).  $30 per visit, cash only  °[facility]  (919) 537-3737 for adults; Children under age 4, call Graduate Pediatric Dentistry at (919) [phone]. Children aged 4-14, please call (919) 537-3737 to request a pediatric application. ° Dental services are provided in all areas of dental care including fillings, crowns and bridges, complete and partial dentures, implants, gum treatment, root canals, and extractions. Preventive care is also provided. Treatment is provided to both adults and children. °Patients are selected via a lottery and there is often a waiting list. °  °[facility] [address], °[place] ° (336) 763-8833 [email or web address].[email or web address] °  °[facility] [address], [place], [place] (336)723-1848, Ext. 123 Second and Fourth Thursday of each month, opens at 6:30 AM; Clinic ends at 9 AM.  Patients are seen on a first-come first-served basis, and a limited number are seen during each clinic.  ° °[facility] ° [address], [place], [place] (336) 723-7904    Eligibility Requirements °You must have lived in [place], [place], or [place] counties for at least the last three months. °  You cannot be eligible for state or federal sponsored healthcare insurance, including Veterans Administration, Medicaid, or Medicare. °  You generally cannot be eligible for healthcare insurance through your employer.  °  How to apply: °Eligibility screenings are held every Tuesday and Wednesday afternoon from 1:00 pm until 4:00 pm. You do not need an appointment for the interview!  °[facility] [address], [place], [place] 336-631-2330   °[facility] [facility]  336-342-8273   °[facility]  336-703-3100   °[facility]  336-570-6415   ° °Behavioral Health Resources in the Community: °Intensive Outpatient Programs °Organization         Address  Phone  Notes  °[facility] [address], [place], [place] 336-878-6098   °[facility] [address], [place], [place] 336-832-9800   °[facility] [address], [place], [place] ° 336-882-2125   °[facility] [address],  °[place], [place] 1-800-853-5163 or 336-641-4981   °Substance Abuse Resources °Organization         Address  Phone  Notes  °[facility]  336-882-2125   °[facility]  336-784-9470   °[facility]  336-285-9073   °[name]  336-845-3988   °Residential & Outpatient Substance Abuse Program  1-800-659-3381   °Psychological Services °Organization         Address  Phone  Notes  °[facility]  336- 832-9600   °[facility]  336- 378-7881   °[facility] [address], [place] 1-800-853-5163 or 336-641-4981   ° °Mobile Crisis Teams °Organization         Address  Phone  Notes  °[facility], Mobile Crisis Care Unit  1-877-626-1772   °Assertive °[facility] ° [address]  [place], [place] 336-834-9664   °Sharon DeEsch [address] °[place] 336-554-5454   ° °Self-Help/Support Groups °Organization         Address  Phone             Notes  °[facility] [place] - variety of support groups  336- 373-1402 Call for more information  °Narcotics Anonymous (NA), [facility] [address], °[place]  2 meetings at this location  ° °  Residential Treatment Programs °Organization         Address  Phone  Notes  °[facility] [address],    °[place]  1-866-801-8205   °[facility] ° [address], [place], [place] 704-293-8524   °[name] [address], [place] 336-845-3988 Admissions: 8am-3pm M-F  °[facility] [address]    °[place], [place] 336-841-1104   °[facility] [address], [place], [place] 336-379-7146   °[facility] [address]  °[place], [place] 336-285-9073   °[facility] - Intensive Outpatient [address], [place], [place] 336-852-3033   °[facility] [address]  °[place], [place] 1-877-615-2722 or 336-784-9470   °[facility] [address] [place], [place] 336-227-7417 Accepts Medicaid  °[facility] [address]  ° [place] 1-800-659-3381 Substance Abuse/Addiction Treatment  ° °[place] Behavioral Health Resources °Organization         Address  Phone  Notes  °[facility]  (888) 581-9988   °Julie Brannon, PhD [address] [place], [place]   (336) 349-5553 or (336) 951-0000   °[facility]   [address] °[place], [place] (336) 349-4454   °[name] [address], [place], [place] (336) 342-8316 Insurance/Medicaid/sponsorship through [facility]  °[facility] [address]                                    [place], [place] (336) 342-8316 Therapy/tele-psych/case    °[facility] [address]  ° [place], [place] (336) 349-2233    °Dr. Arfeen  (336) 349-4544   °[facility] [place]  [employer] [place] [facility] 1) [address],  °2) [address], [place] °3)  [address], [place] (336) 349-3220 °(336) 342-7768 ° °(336) 342-8140   °[place] [facility] (336) 342-1394 or (336) 342-3537 (After Hours)    ° ° °

## 2013-04-30 NOTE — ED Notes (Signed)
Pt given belongings, signed belongings form.

## 2013-04-30 NOTE — BH Assessment (Signed)
Telepsych was going to be initiated/completed this am by Renata Capriceonrad, NP. However, it was decided by EDP, Renata Capriceonrad, and TTS that the telepsych would be put on hold. The plan is to try make arrangements [facility] and if he is not able to get into the program today he will be discharged home with referrals.

## 2013-04-30 NOTE — BH Assessment (Signed)
Writer discussed patient's disposition with EDP-Dr. Bebe ShaggyWickline. The plan discussed includes working on [facility]. However, if [facility] not [facility] he will discharged. Writer contacted Dorris, LCSW to discuss patient's disposition plan. Dorris will assist by contacting [facility] after 8am to follow up on this patient's disposition.

## 2013-04-30 NOTE — ED Provider Notes (Signed)
Pt has been cleared for d/c home He denies SI He will pursue outpatient detox BP 122/72  Pulse 51  Temp(Src) 98.1 F (36.7 C) (Oral)  Resp 18  SpO2 98%   Joya Gaskinsonald W Iris Tatsch, MD 04/30/13 1635

## 2013-04-30 NOTE — ED Notes (Signed)
Patient just finished with [facility] interview.   Patient at nurses station to make a phone call.

## 2013-04-30 NOTE — ED Notes (Signed)
Per CSW, patient is still under review with [facility], but they still need the form from his atty stating he has representation for pending charges against him.

## 2013-04-30 NOTE — ED Notes (Signed)
Placed telepsych machine at bedside per Ava, BH.   Extender to speak with patient.

## 2013-04-30 NOTE — ED Notes (Signed)
Patient talking with [facility] now.  (phone interview).

## 2013-04-30 NOTE — Progress Notes (Signed)
CSW [facility] with [facility]. Pt was able to complete phone interview. Pt has pending legal charges and [facility] is requesting information from pt's lawyer. Pt is presently calling lawyer to obtain information. [facility]. Pt shared with CSW that his mother is ill and when thinks about her he gets depressed and starts drinking alcohol. CSW shared with pt coping skills he can use as oppose to drinking and using cocaine. Pt appreciative of information. CSW will continue to assist pt in getting into [facility].     [address]Syleena Mchan, [place] [phone]

## 2013-04-30 NOTE — ED Notes (Signed)
Diet order placed 

## 2013-04-30 NOTE — Progress Notes (Signed)
B.Tylen Leverich, MHT provided follow up with disposition plan for patient. Patient has made contact with [facility] on today and has been instructed by [facility] to provide them with documentation regarding his legal issue. Patient is aware of this and is currently up for discharge. Writer informed Dr. Herbert PunLickline of disposition

## 2013-05-01 ENCOUNTER — Inpatient Hospital Stay (HOSPITAL_COMMUNITY)
Admission: AD | Admit: 2013-05-01 | Discharge: 2013-05-03 | DRG: 392 | Disposition: A | Discharge status: Home / Self Care | Payer: Self-pay | Attending: Internal Medicine | Admitting: Internal Medicine

## 2013-05-01 ENCOUNTER — Encounter (HOSPITAL_COMMUNITY): Payer: Self-pay | Admitting: Emergency Medicine

## 2013-05-01 DIAGNOSIS — F3289 Other specified depressive episodes: Secondary | ICD-10-CM | POA: Diagnosis present

## 2013-05-01 DIAGNOSIS — R45851 Suicidal ideations: Secondary | ICD-10-CM

## 2013-05-01 DIAGNOSIS — F1994 Other psychoactive substance use, unspecified with psychoactive substance-induced mood disorder: Secondary | ICD-10-CM | POA: Diagnosis present

## 2013-05-01 DIAGNOSIS — E872 Acidosis, unspecified: Secondary | ICD-10-CM | POA: Diagnosis present

## 2013-05-01 DIAGNOSIS — F32A Depression, unspecified: Secondary | ICD-10-CM

## 2013-05-01 DIAGNOSIS — F17200 Nicotine dependence, unspecified, uncomplicated: Secondary | ICD-10-CM | POA: Diagnosis present

## 2013-05-01 DIAGNOSIS — G8929 Other chronic pain: Secondary | ICD-10-CM | POA: Diagnosis present

## 2013-05-01 DIAGNOSIS — F1020 Alcohol dependence, uncomplicated: Secondary | ICD-10-CM | POA: Diagnosis present

## 2013-05-01 DIAGNOSIS — D72829 Elevated white blood cell count, unspecified: Secondary | ICD-10-CM | POA: Diagnosis present

## 2013-05-01 DIAGNOSIS — Z6825 Body mass index (BMI) 25.0-25.9, adult: Secondary | ICD-10-CM

## 2013-05-01 DIAGNOSIS — F1910 Other psychoactive substance abuse, uncomplicated: Secondary | ICD-10-CM | POA: Diagnosis present

## 2013-05-01 DIAGNOSIS — R109 Unspecified abdominal pain: Secondary | ICD-10-CM | POA: Diagnosis present

## 2013-05-01 DIAGNOSIS — M6282 Rhabdomyolysis: Secondary | ICD-10-CM | POA: Diagnosis present

## 2013-05-01 DIAGNOSIS — K2920 Alcoholic gastritis without bleeding: Principal | ICD-10-CM | POA: Diagnosis present

## 2013-05-01 DIAGNOSIS — F1010 Alcohol abuse, uncomplicated: Secondary | ICD-10-CM

## 2013-05-01 DIAGNOSIS — F1420 Cocaine dependence, uncomplicated: Secondary | ICD-10-CM | POA: Diagnosis present

## 2013-05-01 DIAGNOSIS — F329 Major depressive disorder, single episode, unspecified: Secondary | ICD-10-CM | POA: Diagnosis present

## 2013-05-01 DIAGNOSIS — F1410 Cocaine abuse, uncomplicated: Secondary | ICD-10-CM

## 2013-05-01 NOTE — ED Notes (Signed)
Per Ptar pt reports doing crack and drinking today and is suicidal was picked up on [address]. Pt is alert and oriented.
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
## 2013-05-01 NOTE — ED Notes (Signed)
Bed: ZO10WA16 Expected date:  Expected time:  Means of arrival:  Comments: EMS SI / Abd pain / Crack use

## 2013-05-02 ENCOUNTER — Encounter (HOSPITAL_COMMUNITY): Payer: Self-pay | Admitting: Internal Medicine

## 2013-05-02 DIAGNOSIS — F1010 Alcohol abuse, uncomplicated: Secondary | ICD-10-CM

## 2013-05-02 DIAGNOSIS — E872 Acidosis, unspecified: Secondary | ICD-10-CM

## 2013-05-02 DIAGNOSIS — M6282 Rhabdomyolysis: Secondary | ICD-10-CM

## 2013-05-02 DIAGNOSIS — F1410 Cocaine abuse, uncomplicated: Secondary | ICD-10-CM

## 2013-05-02 DIAGNOSIS — R109 Unspecified abdominal pain: Secondary | ICD-10-CM

## 2013-05-02 DIAGNOSIS — F1994 Other psychoactive substance use, unspecified with psychoactive substance-induced mood disorder: Secondary | ICD-10-CM

## 2013-05-02 DIAGNOSIS — F1020 Alcohol dependence, uncomplicated: Secondary | ICD-10-CM

## 2013-05-02 DIAGNOSIS — E8729 Other acidosis: Secondary | ICD-10-CM | POA: Insufficient documentation

## 2013-05-02 DIAGNOSIS — R45851 Suicidal ideations: Secondary | ICD-10-CM

## 2013-05-02 LAB — CBC WITH DIFFERENTIAL/PLATELET
BASOS PCT: 0 % (ref 0–1)
Basophils Absolute: 0 10*3/uL (ref 0.0–0.1)
EOS ABS: 0 10*3/uL (ref 0.0–0.7)
Eosinophils Relative: 0 % (ref 0–5)
HEMATOCRIT: 42.6 % (ref 39.0–52.0)
Hemoglobin: 15.1 g/dL (ref 13.0–17.0)
Lymphocytes Relative: 26 % (ref 12–46)
Lymphs Abs: 3.5 10*3/uL (ref 0.7–4.0)
MCH: 30.5 pg (ref 26.0–34.0)
MCHC: 35.4 g/dL (ref 30.0–36.0)
MCV: 86.1 fL (ref 78.0–100.0)
MONO ABS: 1.2 10*3/uL — AB (ref 0.1–1.0)
MONOS PCT: 9 % (ref 3–12)
Neutro Abs: 8.7 10*3/uL — ABNORMAL HIGH (ref 1.7–7.7)
Neutrophils Relative %: 65 % (ref 43–77)
Platelets: 216 10*3/uL (ref 150–400)
RBC: 4.95 MIL/uL (ref 4.22–5.81)
RDW: 14.7 % (ref 11.5–15.5)
WBC: 13.4 10*3/uL — ABNORMAL HIGH (ref 4.0–10.5)

## 2013-05-02 LAB — URINALYSIS, ROUTINE W REFLEX MICROSCOPIC
Bilirubin Urine: NEGATIVE
Glucose, UA: NEGATIVE mg/dL
Ketones, ur: NEGATIVE mg/dL
LEUKOCYTES UA: NEGATIVE
NITRITE: NEGATIVE
Protein, ur: 30 mg/dL — AB
SPECIFIC GRAVITY, URINE: 1.014 (ref 1.005–1.030)
UROBILINOGEN UA: 0.2 mg/dL (ref 0.0–1.0)
pH: 5 (ref 5.0–8.0)

## 2013-05-02 LAB — COMPREHENSIVE METABOLIC PANEL
ALBUMIN: 4.1 g/dL (ref 3.5–5.2)
ALT: 22 U/L (ref 0–53)
AST: 38 U/L — ABNORMAL HIGH (ref 0–37)
Alkaline Phosphatase: 72 U/L (ref 39–117)
BUN: 12 mg/dL (ref 6–23)
CALCIUM: 9.3 mg/dL (ref 8.4–10.5)
CO2: 18 mEq/L — ABNORMAL LOW (ref 19–32)
CREATININE: 0.77 mg/dL (ref 0.50–1.35)
Chloride: 93 mEq/L — ABNORMAL LOW (ref 96–112)
GFR calc Af Amer: >90 mL/min (ref >90)
GFR calc non Af Amer: >90 mL/min (ref >90)
Glucose, Bld: 76 mg/dL (ref 70–99)
Potassium: 4.3 mEq/L (ref 3.7–5.3)
Sodium: 134 mEq/L — ABNORMAL LOW (ref 137–147)
Total Bilirubin: 0.4 mg/dL (ref 0.3–1.2)
Total Protein: 8 g/dL (ref 6.0–8.3)

## 2013-05-02 LAB — URINE MICROSCOPIC-ADD ON

## 2013-05-02 LAB — RAPID URINE DRUG SCREEN, HOSP PERFORMED
Amphetamines: NOT DETECTED
Barbiturates: NOT DETECTED
Benzodiazepines: NOT DETECTED
COCAINE: POSITIVE — AB
OPIATES: NOT DETECTED
Tetrahydrocannabinol: NOT DETECTED

## 2013-05-02 LAB — ETHANOL: ALCOHOL ETHYL (B): 118 mg/dL — AB (ref 0–11)

## 2013-05-02 LAB — CK
CK TOTAL: 1047 U/L — AB (ref 7–232)
Total CK: 796 U/L — ABNORMAL HIGH (ref 7–232)

## 2013-05-02 LAB — CG4 I-STAT (LACTIC ACID): LACTIC ACID, VENOUS: 3.35 mmol/L — AB (ref 0.5–2.2)

## 2013-05-02 LAB — LIPASE, BLOOD
LIPASE: 20 U/L (ref 11–59)
LIPASE: 15 U/L (ref 11–59)

## 2013-05-02 LAB — TROPONIN I: Troponin I: <0.3 ng/mL (ref <0.30)

## 2013-05-02 MED ORDER — VITAMIN B-1 100 MG PO TABS
100.0000 mg | ORAL_TABLET | Freq: Every day | ORAL | Status: DC
  Administered 2013-05-02 – 2013-05-03 (×2): 100 mg via ORAL
  Filled 2013-05-02 (×2): qty 1

## 2013-05-02 MED ORDER — THIAMINE HCL 100 MG/ML IJ SOLN: 100.0000 mg | Freq: Once | INTRAMUSCULAR | Status: DC

## 2013-05-02 MED ORDER — MORPHINE SULFATE 2 MG/ML IJ SOLN
1.0000 mg | INTRAMUSCULAR | Status: DC | PRN
  Administered 2013-05-02: 1 mg via INTRAVENOUS
  Filled 2013-05-02: qty 1

## 2013-05-02 MED ORDER — SODIUM CHLORIDE 0.9 % IV SOLN
INTRAVENOUS | Status: AC
  Administered 2013-05-02: 07:00:00 via INTRAVENOUS

## 2013-05-02 MED ORDER — LOPERAMIDE HCL 2 MG PO CAPS: 2.0000 mg | ORAL_CAPSULE | ORAL | Status: DC | PRN

## 2013-05-02 MED ORDER — SODIUM CHLORIDE 0.9 % IV SOLN
1000.0000 mL | Freq: Once | INTRAVENOUS | Status: AC
  Administered 2013-05-02: 1000 mL via INTRAVENOUS

## 2013-05-02 MED ORDER — PANTOPRAZOLE SODIUM 40 MG IV SOLR
40.0000 mg | Freq: Every day | INTRAVENOUS | Status: DC
  Administered 2013-05-02: 40 mg via INTRAVENOUS
  Filled 2013-05-02 (×2): qty 40

## 2013-05-02 MED ORDER — FOLIC ACID 1 MG PO TABS
1.0000 mg | ORAL_TABLET | Freq: Every day | ORAL | Status: DC
  Administered 2013-05-02 – 2013-05-03 (×2): 1 mg via ORAL
  Filled 2013-05-02 (×2): qty 1

## 2013-05-02 MED ORDER — LORAZEPAM 2 MG/ML IJ SOLN: 0.0000 mg | Freq: Two times a day (BID) | INTRAMUSCULAR | Status: DC

## 2013-05-02 MED ORDER — ADULT MULTIVITAMIN W/MINERALS CH: 1.0000 | ORAL_TABLET | Freq: Every day | ORAL | Status: DC

## 2013-05-02 MED ORDER — ONDANSETRON HCL 4 MG/2ML IJ SOLN
4.0000 mg | Freq: Four times a day (QID) | INTRAMUSCULAR | Status: DC | PRN
Start: 2013-05-02 — End: 2013-05-03

## 2013-05-02 MED ORDER — ONDANSETRON 4 MG PO TBDP
4.0000 mg | ORAL_TABLET | Freq: Four times a day (QID) | ORAL | Status: DC | PRN
  Filled 2013-05-02: qty 1

## 2013-05-02 MED ORDER — HYDROXYZINE HCL 25 MG PO TABS
25.0000 mg | ORAL_TABLET | Freq: Four times a day (QID) | ORAL | Status: DC | PRN
  Filled 2013-05-02: qty 1

## 2013-05-02 MED ORDER — SODIUM CHLORIDE 0.9 % IV SOLN: INTRAVENOUS | Status: DC

## 2013-05-02 MED ORDER — ALUM & MAG HYDROXIDE-SIMETH 200-200-20 MG/5ML PO SUSP: 30.0000 mL | ORAL | Status: DC | PRN

## 2013-05-02 MED ORDER — LORAZEPAM 2 MG/ML IJ SOLN: 0.0000 mg | Freq: Four times a day (QID) | INTRAMUSCULAR | Status: DC

## 2013-05-02 MED ORDER — SODIUM CHLORIDE 0.9 % IV SOLN
1000.0000 mL | INTRAVENOUS | Status: DC
  Administered 2013-05-02: 1000 mL via INTRAVENOUS

## 2013-05-02 MED ORDER — ONDANSETRON HCL 4 MG PO TABS: 4.0000 mg | ORAL_TABLET | Freq: Four times a day (QID) | ORAL | Status: DC | PRN

## 2013-05-02 MED ORDER — ADULT MULTIVITAMIN W/MINERALS CH
1.0000 | ORAL_TABLET | Freq: Every day | ORAL | Status: DC
  Administered 2013-05-02 – 2013-05-03 (×2): 1 via ORAL
  Filled 2013-05-02 (×2): qty 1

## 2013-05-02 MED ORDER — ENOXAPARIN SODIUM 40 MG/0.4ML ~~LOC~~ SOLN
40.0000 mg | SUBCUTANEOUS | Status: DC
  Filled 2013-05-02 (×2): qty 0.4

## 2013-05-02 MED ORDER — CHLORDIAZEPOXIDE HCL 25 MG PO CAPS
25.0000 mg | ORAL_CAPSULE | Freq: Once | ORAL | Status: AC
  Administered 2013-05-02: 25 mg via ORAL
  Filled 2013-05-02: qty 1

## 2013-05-02 MED ORDER — THIAMINE HCL 100 MG/ML IJ SOLN
100.0000 mg | Freq: Every day | INTRAMUSCULAR | Status: DC
  Filled 2013-05-02 (×2): qty 1

## 2013-05-02 MED ORDER — CHLORDIAZEPOXIDE HCL 25 MG PO CAPS: 25.0000 mg | ORAL_CAPSULE | Freq: Four times a day (QID) | ORAL | Status: DC | PRN

## 2013-05-02 MED ORDER — VITAMIN B-1 100 MG PO TABS: 100.0000 mg | ORAL_TABLET | Freq: Every day | ORAL | Status: DC

## 2013-05-02 MED ORDER — LORAZEPAM 1 MG PO TABS: 1.0000 mg | ORAL_TABLET | Freq: Four times a day (QID) | ORAL | Status: DC | PRN

## 2013-05-02 MED ORDER — MAGNESIUM HYDROXIDE 400 MG/5ML PO SUSP: 30.0000 mL | Freq: Every day | ORAL | Status: DC | PRN

## 2013-05-02 MED ORDER — LORAZEPAM 2 MG/ML IJ SOLN: 1.0000 mg | Freq: Four times a day (QID) | INTRAMUSCULAR | Status: DC | PRN

## 2013-05-02 MED ORDER — LORATADINE 10 MG PO TABS
10.0000 mg | ORAL_TABLET | Freq: Every day | ORAL | Status: DC
  Administered 2013-05-02 – 2013-05-03 (×2): 10 mg via ORAL
  Filled 2013-05-02 (×2): qty 1

## 2013-05-02 NOTE — Progress Notes (Signed)
Patient refused Lovenox injection.  Stated "I've been here multiple times and never had that; I didn't order that."  Educated patient regarding increased risk of blood clots while staying in the hospital and purpose of medication in preventing blood clots from forming.  Still refused.  Will continue to monitor.

## 2013-05-02 NOTE — ED Notes (Signed)
Lars MageI Knapp EDP given CG4 Lactic results.

## 2013-05-02 NOTE — Progress Notes (Signed)
[facility] PROGRESS NOTE  Adam [name] ZOX:[id number] DOB: April 04, 1985 DOA: 05/01/2013 PCP: No PCP Per Patient  Assessment/Plan: 1. Abdominal pain most likely secondary to alcoholic gastritis - I have placed patient on Protonix IV and diet as tolerated. Pain medication as needed. Abdomen appears benign on exam. 2. Metabolic acidosis and lactic acidosis - probably secondary to alcoholism. Continue with IV hydration and closely follow metabolic panel. 3. Rhabdomyolysis - probably secondary to cocaine abuse. Hydrate and closely follow CK levels. 4. Suicidal ideation and depression - patient has been placed with sitter. Consulted pschiatry  5. Polysubstance abuse - including cocaine and alcohol - patient has been placed on Ativan withdrawal protocol. Thiamine. Psychiatry consult. 6. Mild leukocytosis - probably reactionary. Patient is afebrile. Follow CBC.     Code Status: full code Family Communication: none at bedside Disposition Plan: pending.    Consultants:  Psychiatry.  Procedures:  none  HPI/Subjective: No complaints  Objective: Filed Vitals:   05/02/13 1434  BP: 117/56  Pulse: 77  Temp: 98.5 F (36.9 C)  Resp: 20    Intake/Output Summary (Last 24 hours) at 05/02/13 1550 Last data filed at 05/02/13 1500  Gross per 24 hour  Intake 1591.67 ml  Output    400 ml  Net 1191.67 ml   Filed Weights   05/02/13 0700  Weight: 82.3 kg (181 lb 7 oz)    Exam:   General:  Alert afebrile   Cardiovascular: s1s2  Respiratory: ctab  Abdomen: soft nt  nd bs  Musculoskeletal: no pedal edema  Data Reviewed: Basic Metabolic Panel:  Recent Labs Lab 04/27/13 2036 05/02/13 0001  NA 139 134*  K 4.1 4.3  CL 97 93*  CO2 21 18*  GLUCOSE 87 76  BUN 14 12  CREATININE 0.92 0.77  CALCIUM 9.0 9.3   Liver Function Tests:  Recent Labs Lab 04/27/13 2036 05/02/13 0001  AST 45* 38*  ALT 27 22  ALKPHOS 73 72  BILITOT 0.4 0.4  PROT 8.2 8.0  ALBUMIN 3.9 4.1     Recent Labs Lab 05/02/13 0001 05/02/13 0800  LIPASE 15 20   No results found for this basename: AMMONIA,  in the last 168 hours CBC:  Recent Labs Lab 04/27/13 2036 05/02/13 0001  WBC 9.5 13.4*  NEUTROABS  --  8.7*  HGB 16.0 15.1  HCT 44.3 42.6  MCV 85.4 86.1  PLT 240 216   Cardiac Enzymes:  Recent Labs Lab 05/02/13 0001 05/02/13 0210 05/02/13 0800  CKTOTAL  --  1047* 796*  TROPONINI <0.30  --  <0.30   BNP (last 3 results) No results found for this basename: PROBNP,  in the last 8760 hours CBG: No results found for this basename: GLUCAP,  in the last 168 hours  No results found for this or any previous visit (from the past 240 hour(s)).   Studies: No results found.  Scheduled Meds: . enoxaparin (LOVENOX) injection  40 mg Subcutaneous Q24H  . folic acid  1 mg Oral Daily  . loratadine  10 mg Oral Daily  . LORazepam  0-4 mg Intravenous Q6H   Followed by  . [START ON 05/04/2013] LORazepam  0-4 mg Intravenous Q12H  . multivitamin with minerals  1 tablet Oral Daily  . pantoprazole (PROTONIX) IV  40 mg Intravenous QHS  . thiamine  100 mg Oral Daily   Or  . thiamine  100 mg Intravenous Daily   Continuous Infusions: . sodium chloride 125 mL/hr at 05/02/13 0704  Principal Problem:   Abdominal pain Active Problems:   Alcohol dependence   Substance induced mood disorder   Lactic acidosis   Rhabdomyolysis   Suicidal ideation    Time spent: 25 min    Chanah Tidmore  [facility] Pager [phone] If 7PM-7AM, please contact night-coverage at [email or web address], password [email or web address] 05/02/2013, 3:50 PM  LOS: 1 day

## 2013-05-02 NOTE — Care Management Note (Addendum)
    Page 1 of 1   05/03/2013     4:46:42 PM   CARE MANAGEMENT NOTE 05/03/2013  Patient:  Adam [name],Adam [name]   Account Number:  [id number]  Date Initiated:  05/02/2013  Documentation initiated by:  Lanier ClamMAHABIR,Javonne Dorko  Subjective/Objective Assessment:   28 Y/O M ADMITTED W/ABD PAIN,SUICIDAL IDEATION.[facility]     Action/Plan:   FROM HOME.NO PCP,PHARMACY.   Anticipated DC Date:  05/03/2013   Anticipated DC Plan:  HOME/SELF CARE      DC Planning Services  CM consult      Choice offered to / List presented to:             Status of service:  Completed, signed off Medicare Important Message given?   (If response is "NO", the following Medicare IM given date fields will be blank) Date Medicare IM given:   Date Additional Medicare IM given:    Discharge Disposition:  HOME/SELF CARE  Per UR Regulation:  Reviewed for med. necessity/level of care/duration of stay  If discussed at Long Length of Stay Meetings, dates discussed:    Comments:  05/03/13 Nahara Dona RN,BSN NCM [phone] D/C HOME.SW PROVIDED OTPT RESOURCES.  05/02/13 Acy Orsak RN,BSN NCM [phone] 1:1 SITTER.PATIENT TO CALL [facility] TO SET UP PCP APPT,NOTED IN D/C SECTION.ALSO PROVIED PATIENT W/HEALTH INSURANCE WEBSITE,OTHER COMMUNITY RESOURCES,$[facility] [place] [place]
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
## 2013-05-03 DIAGNOSIS — F329 Major depressive disorder, single episode, unspecified: Secondary | ICD-10-CM

## 2013-05-03 DIAGNOSIS — F3289 Other specified depressive episodes: Secondary | ICD-10-CM

## 2013-05-03 LAB — BASIC METABOLIC PANEL
BUN: 9 mg/dL (ref 6–23)
CHLORIDE: 105 meq/L (ref 96–112)
CO2: 26 mEq/L (ref 19–32)
CREATININE: 0.81 mg/dL (ref 0.50–1.35)
Calcium: 8.5 mg/dL (ref 8.4–10.5)
GFR calc Af Amer: >90 mL/min (ref >90)
GFR calc non Af Amer: >90 mL/min (ref >90)
Glucose, Bld: 87 mg/dL (ref 70–99)
Potassium: 4.1 mEq/L (ref 3.7–5.3)
Sodium: 143 mEq/L (ref 137–147)

## 2013-05-03 LAB — CBC
HEMATOCRIT: 37 % — AB (ref 39.0–52.0)
Hemoglobin: 12.6 g/dL — ABNORMAL LOW (ref 13.0–17.0)
MCH: 29.9 pg (ref 26.0–34.0)
MCHC: 34.1 g/dL (ref 30.0–36.0)
MCV: 87.7 fL (ref 78.0–100.0)
PLATELETS: 165 10*3/uL (ref 150–400)
RBC: 4.22 MIL/uL (ref 4.22–5.81)
RDW: 14.7 % (ref 11.5–15.5)
WBC: 8.2 10*3/uL (ref 4.0–10.5)

## 2013-05-03 MED ORDER — FOLIC ACID 1 MG PO TABS: 1.0000 mg | ORAL_TABLET | Freq: Every day | ORAL | Status: DC

## 2013-05-03 MED ORDER — PANTOPRAZOLE SODIUM 40 MG PO TBEC
40.0000 mg | DELAYED_RELEASE_TABLET | Freq: Every day | ORAL | Status: DC
  Administered 2013-05-03: 40 mg via ORAL
  Filled 2013-05-03: qty 1

## 2013-05-03 MED ORDER — PANTOPRAZOLE SODIUM 40 MG PO TBEC: 40.0000 mg | DELAYED_RELEASE_TABLET | Freq: Every day | ORAL | Status: DC

## 2013-05-03 MED ORDER — THIAMINE HCL 100 MG PO TABS: 100.0000 mg | ORAL_TABLET | Freq: Every day | ORAL | Status: DC

## 2013-05-03 NOTE — Progress Notes (Signed)
This patient is receiving Protonix IV. Based on criteria approved by the Pharmacy and Therapeutics Committee, this medication is being converted to the equivalent oral dose form. These criteria include:   . The patient is eating (either orally or per tube) and/or has been taking other orally administered medications for at least 24 hours.  . This patient has no evidence of active gastrointestinal bleeding or impaired GI absorption (gastrectomy, short bowel, patient on TNA or NPO).   If you have questions about this conversion, please contact the pharmacy department.  Thank you,  Otho BellowsGreen, Torell Minder L, [facility] HealthRPH 05/03/2013 7:57 AM

## 2013-05-03 NOTE — Discharge Summary (Signed)
Physician Discharge Summary  Adam [name] ZOX:[id number] DOB: 1985-07-01 DOA: 05/01/2013  PCP: No PCP Per Patient  Admit date: 05/01/2013 Discharge date: 05/03/2013  Time spent: 30 minutes  Recommendations for Outpatient Follow-up:  1. Follow up with PCP in one week.   Discharge Diagnoses:  Principal Problem:   Abdominal pain Active Problems:   Alcohol dependence   Substance induced mood disorder   Lactic acidosis   Rhabdomyolysis   Suicidal ideation   Discharge Condition: stable  Diet recommendation: regular diet  Filed Weights   05/02/13 0700  Weight: 82.3 kg (181 lb 7 oz)    History of present illness:  Adam Devoidaul Pal is a 28 y.o. male history of polysubstance abuse including alcohol and cocaine abuse, substance induced mood disorder presents to the ER because of suicidal ideation and abdominal pain. Patient complains of diffuse abdominal discomfort denies any associated nausea vomiting or diarrhea. He states he has been taking a lot of alcohol with cocaine. He had told the ER physician that he was doing this to hurt himself. In the ER labs reveal metabolic acidosis and lactic acidosis with elevated CK levels. He did complain of some chest pain to the ER physician but denies any chest pain to me. Troponins were negative and EKG did not show anything acute. Patient has been admitted for hydration and further management. Patient denies any shortness of breath palpitations focal deficits headache visual symptoms.   Hospital Course:  1. Abdominal pain most likely secondary to alcoholic gastritis -he was started on IV protonix, and later transitioned to po protonix. Abdomen appears benign on exam. 2. Metabolic acidosis and lactic acidosis - probably secondary to alcoholism. Resolved with hydration. 3. Rhabdomyolysis - probably secondary to cocaine abuse. Improving CK levels.  4. Suicidal ideation and depression - patient has been placed with sitter. Consulted pschiatry . No suicidal  ideation . Outpatient rehab.  5. Polysubstance abuse - including cocaine and alcohol - counseled.  6. Mild leukocytosis - probably reactionary. Patient is afebrile. Resolved.      Procedures: none Consultations:  psychaitry  Discharge Exam: Filed Vitals:   05/03/13 1522  BP: 141/83  Pulse: 68  Temp: 98.1 F (36.7 C)  Resp: 18    General: alert afebrile comfortable Cardiovascular: s1s2 Respiratory: ctab  Discharge Instructions  Discharge Orders   Future Orders Complete By Expires   Discharge instructions  As directed    Comments:     Follow up with [facility] / long term rehab as recommended.       Medication List         folic acid 1 MG tablet  Commonly known as:  FOLVITE  Take 1 tablet (1 mg total) by mouth daily.     mupirocin ointment 2 %  Commonly known as:  BACTROBAN  Place 1 application into the nose 2 (two) times daily as needed (sore).     pantoprazole 40 MG tablet  Commonly known as:  PROTONIX  Take 1 tablet (40 mg total) by mouth daily.     thiamine 100 MG tablet  Take 1 tablet (100 mg total) by mouth daily.       No Known Allergies     Follow-up Information   Schedule an appointment as soon as possible for a visit with [facility]    . (Bring in photo id/$20/medicines in bottles.)    Contact information:   [address] [place] [id number] [phone]       The  results of significant diagnostics from this hospitalization (including imaging, microbiology, ancillary and laboratory) are listed below for reference.    Significant Diagnostic Studies: Dg Chest 2 View  04/18/2013   CLINICAL DATA:  Chest pain  EXAM: CHEST  2 VIEW  COMPARISON:  Prior radiograph from 02/24/2013  FINDINGS: The cardiac and mediastinal silhouettes are stable in size and contour, and remain within normal limits.  The lungs are normally inflated. No airspace consolidation, pleural effusion, or pulmonary edema is identified. There  is no pneumothorax.  No acute osseous abnormality identified.  IMPRESSION: No active cardiopulmonary disease.   Electronically Signed   By: Rise Mu M.D.   On: 04/18/2013 05:35    Microbiology: No results found for this or any previous visit (from the past 240 hour(s)).   Labs: Basic Metabolic Panel:  Recent Labs Lab 04/27/13 2036 05/02/13 0001 05/03/13 0407  NA 139 134* 143  K 4.1 4.3 4.1  CL 97 93* 105  CO2 21 18* 26  GLUCOSE 87 76 87  BUN 14 12 9   CREATININE 0.92 0.77 0.81  CALCIUM 9.0 9.3 8.5   Liver Function Tests:  Recent Labs Lab 04/27/13 2036 05/02/13 0001  AST 45* 38*  ALT 27 22  ALKPHOS 73 72  BILITOT 0.4 0.4  PROT 8.2 8.0  ALBUMIN 3.9 4.1    Recent Labs Lab 05/02/13 0001 05/02/13 0800  LIPASE 15 20   No results found for this basename: AMMONIA,  in the last 168 hours CBC:  Recent Labs Lab 04/27/13 2036 05/02/13 0001 05/03/13 0407  WBC 9.5 13.4* 8.2  NEUTROABS  --  8.7*  --   HGB 16.0 15.1 12.6*  HCT 44.3 42.6 37.0*  MCV 85.4 86.1 87.7  PLT 240 216 165   Cardiac Enzymes:  Recent Labs Lab 05/02/13 0001 05/02/13 0210 05/02/13 0800  CKTOTAL  --  1047* 796*  TROPONINI <0.30  --  <0.30   BNP: BNP (last 3 results) No results found for this basename: PROBNP,  in the last 8760 hours CBG: No results found for this basename: GLUCAP,  in the last 168 hours     Signed:  Gaylin Osoria  [facility] 05/03/2013, 3:34 PM
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
## 2013-05-03 NOTE — Progress Notes (Signed)
Clinical Social Work Department CLINICAL SOCIAL WORK PSYCHIATRY SERVICE LINE ASSESSMENT 05/03/2013  Patient:  Adam [name]  Account:  [id number]  [place] Date:  05/01/2013  Clinical Social Worker:  Sindy Messing, LCSW  Date/Time:  05/03/2013 03:30 PM Referred by:  Physician  Date referred:  05/03/2013 Reason for Referral  Psychosocial assessment   Presenting Symptoms/Problems (In the person's/family's own words):   Psych consulted due to   Abuse/Neglect/Trauma History (check all that apply)  Denies history   Abuse/Neglect/Trauma Comments:   Psychiatric History (check all that apply)  Outpatient treatment  Inpatient/hospitilization  Residential treatment   Psychiatric medications:  Ativan 0-4 mg   Current Mental Health Hospitalizations/Previous Mental Health History:   Patient reports he feels depressed often but feels that depression is directly related to substance use. Patient reports he has been hospitalized several times but feels that he needs SA treatment.   Current provider:   None   Place and Date:   N/A   Current Medications:   Scheduled Meds:      . enoxaparin (LOVENOX) injection  40 mg Subcutaneous Q24H  . folic acid  1 mg Oral Daily  . loratadine  10 mg Oral Daily  . LORazepam  0-4 mg Intravenous Q6H   Followed by     . [START ON 05/04/2013] LORazepam  0-4 mg Intravenous Q12H  . multivitamin with minerals  1 tablet Oral Daily  . pantoprazole  40 mg Oral Daily  . thiamine  100 mg Oral Daily   Or     . thiamine  100 mg Intravenous Daily        Continuous Infusions:      PRN Meds:.alum & mag hydroxide-simeth, chlordiazePOXIDE, hydrOXYzine, loperamide, LORazepam, LORazepam, magnesium hydroxide, morphine injection, ondansetron (ZOFRAN) IV, ondansetron, ondansetron       Previous Impatient Admission/Date/Reason:   [name] in the past. Patient went to [name] about 3-4 months ago. Patient stayed at [name] (in [place], [place]) about 2-3 years ago.   Emotional Health /  Current Symptoms    Suicide/Self Harm  None reported   Suicide attempt in the past:   Per chart review, patient admitted for SI after using crack and alcohol. Patient denies any current SI or HI.   Other harmful behavior:   Chronic substance use.   Psychotic/Dissociative Symptoms  None reported   Other Psychotic/Dissociative Symptoms:   None reported at this time.    Attention/Behavioral Symptoms  Within Normal Limits   Other Attention / Behavioral Symptoms:   Patient engaged during assessment.    Cognitive Impairment  Within Normal Limits   Other Cognitive Impairment:   Patient alert and oriented.    Mood and Adjustment  Aggressive/frustrated    Stress, Anxiety, Trauma, Any Recent Loss/Stressor  Current Legal Problems/Pending Court Date   Anxiety (frequency):   N/A   Phobia (specify):   N/A   Compulsive behavior (specify):   N/A   Obsessive behavior (specify):   N/A   Other:   Patient has upcoming court date in March 2015.   Substance Abuse/Use  Current substance use   SBIRT completed (please refer for detailed history):  Y  Self-reported substance use:   Patient reports his consumption varies but reports he used alcohol and crack on day of admission. Patient reports he has tried several treatment facilities in the past and reports that he has legal charges pending due to substance use. Patient is open to treatment again.   Urinary Drug Screen Completed:  Y Alcohol level:  118    Environmental/Housing/Living Arrangement  Stable housing   Who is in the home:   Friend   Emergency contact:  Brittany-friend   Financial  IPRS   Patient's Strengths and Goals (patient's own words):   Patient reports he is motivated to stop drinking and remain sober.   Clinical Social Worker's Interpretive Summary:   CSW received referral in order to complete psychosocial assessment. CSW reviewed chart which stated that psych MD recommends follow up at  [name]. CSW spoke with ED CSW Tamela Oddi Best at [name] who was previously working with patient.    CSW met with patient at bedside. CSW introduced myself and explained role. Patient reports he is living with friends but came to the hospital after using drugs and alcohol. Patient completed SBIRT and admits to daily use. Patient feels that depression is linked to SA but does not want to focus on MH concerns at this time. Patient reports he has worked with his lawyer and feels it will be best for him to return to [name].    CSW assisted patient in calling [name]. [facility] reports patient completed phone interview and had information faxed to facility but they are unable to accept at this time due to upcoming court dates. [facility] reports that patient has court date in March and he cannot be accepted until either the lawyer gets permission from the court for patient to be accepted or has his court date continued. Since patient is unable to accepted at this time and MD reports he is medically stable, CSW and patient discussed alternative options.    Patient reports he stayed at [name] a few months ago and would consider returning for treatment. Patient reports he had the opportunity to stay at [name] longer but felt he could be sober. Patient quickly relapsed after he left treatment. Patient aware that he will need treatment until he is formally accepted to [name].    Patient has substance abuse resource list and reports he will call [name] to schedule an appointment. Patient is aware he will need to complete assessment and there is usually a waiting list prior to being accepted. Patient requested a bus pass to get home and reports no further needs.    Patient engaged during assessment. Patient is frustrated about [facility] not accepting him and reports that it is difficult to get in touch with lawyer and get court to approve stays. Patient appreciative of CSW assistance and contracts for  safety.    CSW updated MD and RN on DC plans.   Disposition:  Outpatient referral made/needed   [place], [place] [phone]

## 2013-06-16 ENCOUNTER — Encounter (HOSPITAL_COMMUNITY): Payer: Self-pay | Admitting: Emergency Medicine

## 2013-06-16 ENCOUNTER — Emergency Department (HOSPITAL_COMMUNITY)
Admission: EM | Admit: 2013-06-16 | Discharge: 2013-06-16 | Disposition: A | Discharge status: Other Acute Inpatient Hospital | Payer: Self-pay | Attending: Emergency Medicine | Admitting: Emergency Medicine

## 2013-06-16 DIAGNOSIS — F3289 Other specified depressive episodes: Secondary | ICD-10-CM | POA: Insufficient documentation

## 2013-06-16 DIAGNOSIS — F10929 Alcohol use, unspecified with intoxication, unspecified: Secondary | ICD-10-CM

## 2013-06-16 DIAGNOSIS — Z79899 Other long term (current) drug therapy: Secondary | ICD-10-CM | POA: Insufficient documentation

## 2013-06-16 DIAGNOSIS — F1410 Cocaine abuse, uncomplicated: Secondary | ICD-10-CM | POA: Insufficient documentation

## 2013-06-16 DIAGNOSIS — Z87828 Personal history of other (healed) physical injury and trauma: Secondary | ICD-10-CM | POA: Insufficient documentation

## 2013-06-16 DIAGNOSIS — R000 Tachycardia, unspecified: Secondary | ICD-10-CM | POA: Insufficient documentation

## 2013-06-16 DIAGNOSIS — F329 Major depressive disorder, single episode, unspecified: Secondary | ICD-10-CM | POA: Insufficient documentation

## 2013-06-16 DIAGNOSIS — R45851 Suicidal ideations: Secondary | ICD-10-CM | POA: Insufficient documentation

## 2013-06-16 DIAGNOSIS — F1010 Alcohol abuse, uncomplicated: Secondary | ICD-10-CM | POA: Insufficient documentation

## 2013-06-16 DIAGNOSIS — M795 Residual foreign body in soft tissue: Secondary | ICD-10-CM | POA: Insufficient documentation

## 2013-06-16 DIAGNOSIS — F17200 Nicotine dependence, unspecified, uncomplicated: Secondary | ICD-10-CM | POA: Insufficient documentation

## 2013-06-16 DIAGNOSIS — F1910 Other psychoactive substance abuse, uncomplicated: Secondary | ICD-10-CM

## 2013-06-16 HISTORY — DX: Alcohol abuse, uncomplicated: F10.10

## 2013-06-16 LAB — CBC WITH DIFFERENTIAL/PLATELET
BASOS PCT: 1 % (ref 0–1)
Basophils Absolute: 0.1 10*3/uL (ref 0.0–0.1)
Eosinophils Absolute: 0 10*3/uL (ref 0.0–0.7)
Eosinophils Relative: 0 % (ref 0–5)
HEMATOCRIT: 42 % (ref 39.0–52.0)
Hemoglobin: 15.3 g/dL (ref 13.0–17.0)
LYMPHS ABS: 2.7 10*3/uL (ref 0.7–4.0)
LYMPHS PCT: 15 % (ref 12–46)
MCH: 31.5 pg (ref 26.0–34.0)
MCHC: 36.4 g/dL — ABNORMAL HIGH (ref 30.0–36.0)
MCV: 86.4 fL (ref 78.0–100.0)
MONO ABS: 1.8 10*3/uL — AB (ref 0.1–1.0)
MONOS PCT: 10 % (ref 3–12)
NEUTROS PCT: 74 % (ref 43–77)
Neutro Abs: 13.7 10*3/uL — ABNORMAL HIGH (ref 1.7–7.7)
Platelets: 231 10*3/uL (ref 150–400)
RBC: 4.86 MIL/uL (ref 4.22–5.81)
RDW: 13.5 % (ref 11.5–15.5)
WBC: 18.3 10*3/uL — AB (ref 4.0–10.5)

## 2013-06-16 LAB — URINALYSIS, ROUTINE W REFLEX MICROSCOPIC
Bilirubin Urine: NEGATIVE
Glucose, UA: NEGATIVE mg/dL
Ketones, ur: NEGATIVE mg/dL
LEUKOCYTES UA: NEGATIVE
Nitrite: NEGATIVE
Protein, ur: NEGATIVE mg/dL
Specific Gravity, Urine: 1.022 (ref 1.005–1.030)
Urobilinogen, UA: 0.2 mg/dL (ref 0.0–1.0)
pH: 5 (ref 5.0–8.0)

## 2013-06-16 LAB — COMPREHENSIVE METABOLIC PANEL
ALK PHOS: 77 U/L (ref 39–117)
ALT: 21 U/L (ref 0–53)
AST: 23 U/L (ref 0–37)
Albumin: 4.2 g/dL (ref 3.5–5.2)
BILIRUBIN TOTAL: 0.3 mg/dL (ref 0.3–1.2)
BUN: 8 mg/dL (ref 6–23)
CHLORIDE: 99 meq/L (ref 96–112)
CO2: 22 meq/L (ref 19–32)
CREATININE: 0.93 mg/dL (ref 0.50–1.35)
Calcium: 9.3 mg/dL (ref 8.4–10.5)
GFR calc Af Amer: >90 mL/min (ref >90)
Glucose, Bld: 84 mg/dL (ref 70–99)
Potassium: 4.3 mEq/L (ref 3.7–5.3)
Sodium: 141 mEq/L (ref 137–147)
Total Protein: 8.2 g/dL (ref 6.0–8.3)

## 2013-06-16 LAB — URINE MICROSCOPIC-ADD ON

## 2013-06-16 LAB — RAPID URINE DRUG SCREEN, HOSP PERFORMED
Amphetamines: NOT DETECTED
BARBITURATES: NOT DETECTED
Benzodiazepines: NOT DETECTED
COCAINE: POSITIVE — AB
Opiates: NOT DETECTED
TETRAHYDROCANNABINOL: NOT DETECTED

## 2013-06-16 LAB — ETHANOL: Alcohol, Ethyl (B): 208 mg/dL — ABNORMAL HIGH (ref 0–11)

## 2013-06-16 MED ORDER — IBUPROFEN 400 MG PO TABS: 600.0000 mg | ORAL_TABLET | Freq: Three times a day (TID) | ORAL | Status: DC | PRN

## 2013-06-16 MED ORDER — ALUM & MAG HYDROXIDE-SIMETH 200-200-20 MG/5ML PO SUSP
30.0000 mL | ORAL | Status: DC | PRN
  Administered 2013-06-16: 30 mL via ORAL
  Filled 2013-06-16: qty 30

## 2013-06-16 MED ORDER — PANTOPRAZOLE SODIUM 40 MG PO TBEC
40.0000 mg | DELAYED_RELEASE_TABLET | Freq: Every day | ORAL | Status: DC
  Administered 2013-06-16: 40 mg via ORAL
  Filled 2013-06-16: qty 1

## 2013-06-16 MED ORDER — VITAMIN B-1 100 MG PO TABS
100.0000 mg | ORAL_TABLET | Freq: Every day | ORAL | Status: DC
  Administered 2013-06-16: 100 mg via ORAL
  Filled 2013-06-16: qty 1

## 2013-06-16 MED ORDER — FOLIC ACID 1 MG PO TABS
1.0000 mg | ORAL_TABLET | Freq: Every day | ORAL | Status: DC
  Administered 2013-06-16: 1 mg via ORAL
  Filled 2013-06-16: qty 1

## 2013-06-16 MED ORDER — ACETAMINOPHEN 325 MG PO TABS: 650.0000 mg | ORAL_TABLET | ORAL | Status: DC | PRN

## 2013-06-16 MED ORDER — ONDANSETRON HCL 4 MG PO TABS: 4.0000 mg | ORAL_TABLET | Freq: Three times a day (TID) | ORAL | Status: DC | PRN

## 2013-06-16 NOTE — Progress Notes (Signed)
[facility].   Adam BambergerMariya Gaspard Isbell Disposition MHT

## 2013-06-16 NOTE — ED Notes (Signed)
Attempted to perform telepsych. Pt would not stay awake for assessment.

## 2013-06-16 NOTE — Progress Notes (Signed)
The following facilities have been called seeking bed availability:  [facility]- per Mariam beds available, referral faxed [facility]- per Olegario MessierKathy beds available, referral faxed [facility]- per Tom beds available, [facility]- per Trey PaulaJeff beds available, referral faxed [facility]- [facility], referral [facility]ed [facility], [facility] per Alinda Moneyony beds available, referral faxed [facility]- referral faxed   [facility]- per [place]ylene [place]acey at capacity [place]- per [place] at [place] [facility]- per [place] beds available but do not [employer]SA programming [facility]- per Dorathy DaftKayla only 2 gero beds available [facility]- per Zollie Scalelivia at capacity [facility]- per Lexmark Internationalil beds available but no Costco WholesaleSA programming [facility]- per [place] have a possible gero bed only [facility]- per Gray[facility]Beth at [facility]- per [facility] at [facility]- no answer    [employer] InternationalMariya Placido Hangartner Disposition MHT

## 2013-06-16 NOTE — BH Assessment (Signed)
Attempted tele-assessment. Nursing staff attempted to rouse Pt but he was too drowsy to respond to questions. Nursing staff will contact TTS at [phone] when Pt is alert and able to respond to questions.  Harlin RainFord Ellis Ria CommentWarrick Jr, LPC, [facility] Triage Specialist [phone]

## 2013-06-16 NOTE — ED Notes (Signed)
Pt. reports suicidal ideation plans to overdose on medications , pt. also requesting  detox for crack cocaine abuse. Paper scrubs given to pt. / security and charge nurse notified.
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
## 2013-06-16 NOTE — BH Assessment (Signed)
[facility] Assessment Progress Note  Spoke with pt's nurse, Becky @ [phone], who stated she will call TTS when pt wakes to perform tele assessment.  Casimer LaniusKristen Maxon Kresse, MS, [facility] Licensed Professional Counselor Triage Specialist
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
## 2013-06-16 NOTE — ED Notes (Signed)
Pt states that he is feeling more hopeless with his increased drug abuse. States that he needs help to stop doing drugs. Pt states he has SI, but with no plan. Pt alert and cooperative and sitter at the bedside for pt safety.

## 2013-06-16 NOTE — BH Assessment (Signed)
Pt was ran by Jamison Lord NP, he was declined due to being admitted at [facility] 12/17/12, 12/25/12, 01/06/13, 02/20/13, 02/26/13, 04/11/13 and being noncompliant with treatment. Jamison recommended inpt treatment, however will need to seek placement elsewhere.   

## 2013-06-16 NOTE — Progress Notes (Signed)
ED CM noted that TTS assessment recommends inpatient tx. [facility], Disposition Techs seeking placement at other facilities

## 2013-06-16 NOTE — BH Assessment (Signed)
Received call for assessment. Spoke to Sunnie NielsenBrian Opitz, MD who said Pt is requesting substance abuse treatment and is reporting suicidal ideation with plan to overdose. Tele-assessment will be initiated.  Harlin RainFord Ellis Ria CommentWarrick Jr, LPC, [facility] Triage Specialist [phone]

## 2013-06-16 NOTE — Progress Notes (Addendum)
Received phone call back from [place], [place] [facility] requesting additional information.  Requesting updated MAR and update on pt status today.  Will fax requested information.  [facility]  Called and spoke with Trey PaulaJeff, stated that if pt has ride back home after discharge pt is accepted to their facility.  Spoke with pt's RN and pt discussed that he would have a ride back home by cousin Adam [name](Adam [name]).     1500 [facility] per Trey PaulaJeff, Charity fundraiserN.  Accepting MD is Dr. Judithann SheenSparks and nurse to nurse report number is [phone].  Pt's nurse aware.    Adam BambergerMariya Advik [name] Disposition MHT

## 2013-06-16 NOTE — ED Notes (Addendum)
Pt. wanded by security at triage room 3 . Sitter with pt.

## 2013-06-16 NOTE — ED Notes (Signed)
Pt to receive lunch tray. 

## 2013-06-21 ENCOUNTER — Encounter (HOSPITAL_COMMUNITY): Payer: Self-pay | Admitting: Emergency Medicine

## 2013-06-21 ENCOUNTER — Emergency Department (HOSPITAL_COMMUNITY)
Admission: EM | Admit: 2013-06-21 | Discharge: 2013-06-22 | Disposition: A | Discharge status: Home / Self Care | Payer: Self-pay | Attending: Emergency Medicine | Admitting: Emergency Medicine

## 2013-06-21 ENCOUNTER — Emergency Department (HOSPITAL_COMMUNITY): Payer: Self-pay

## 2013-06-21 DIAGNOSIS — F411 Generalized anxiety disorder: Secondary | ICD-10-CM | POA: Insufficient documentation

## 2013-06-21 DIAGNOSIS — F1910 Other psychoactive substance abuse, uncomplicated: Secondary | ICD-10-CM | POA: Insufficient documentation

## 2013-06-21 DIAGNOSIS — W3400XA Accidental discharge from unspecified firearms or gun, initial encounter: Secondary | ICD-10-CM

## 2013-06-21 DIAGNOSIS — F1410 Cocaine abuse, uncomplicated: Secondary | ICD-10-CM | POA: Diagnosis present

## 2013-06-21 DIAGNOSIS — F3289 Other specified depressive episodes: Secondary | ICD-10-CM | POA: Insufficient documentation

## 2013-06-21 DIAGNOSIS — F1994 Other psychoactive substance use, unspecified with psychoactive substance-induced mood disorder: Secondary | ICD-10-CM | POA: Diagnosis present

## 2013-06-21 DIAGNOSIS — R109 Unspecified abdominal pain: Secondary | ICD-10-CM | POA: Insufficient documentation

## 2013-06-21 DIAGNOSIS — M795 Residual foreign body in soft tissue: Secondary | ICD-10-CM

## 2013-06-21 DIAGNOSIS — F17200 Nicotine dependence, unspecified, uncomplicated: Secondary | ICD-10-CM | POA: Insufficient documentation

## 2013-06-21 DIAGNOSIS — F1020 Alcohol dependence, uncomplicated: Secondary | ICD-10-CM

## 2013-06-21 DIAGNOSIS — F329 Major depressive disorder, single episode, unspecified: Secondary | ICD-10-CM | POA: Insufficient documentation

## 2013-06-21 HISTORY — DX: Accidental discharge from unspecified firearms or gun, initial encounter: W34.00XA

## 2013-06-21 HISTORY — PX: HAND SURGERY: SHX662

## 2013-06-21 HISTORY — DX: Residual foreign body in soft tissue: M79.5

## 2013-06-21 LAB — CBC
HEMATOCRIT: 40.8 % (ref 39.0–52.0)
Hemoglobin: 14.7 g/dL (ref 13.0–17.0)
MCH: 30.6 pg (ref 26.0–34.0)
MCHC: 36 g/dL (ref 30.0–36.0)
MCV: 85 fL (ref 78.0–100.0)
Platelets: 225 10*3/uL (ref 150–400)
RBC: 4.8 MIL/uL (ref 4.22–5.81)
RDW: 13.3 % (ref 11.5–15.5)
WBC: 12.3 10*3/uL — AB (ref 4.0–10.5)

## 2013-06-21 LAB — ETHANOL: Alcohol, Ethyl (B): <11 mg/dL (ref 0–11)

## 2013-06-21 LAB — RAPID URINE DRUG SCREEN, HOSP PERFORMED
Amphetamines: NOT DETECTED
BARBITURATES: NOT DETECTED
Benzodiazepines: NOT DETECTED
COCAINE: POSITIVE — AB
Opiates: NOT DETECTED
TETRAHYDROCANNABINOL: NOT DETECTED

## 2013-06-21 LAB — COMPREHENSIVE METABOLIC PANEL
ALT: 18 U/L (ref 0–53)
AST: 14 U/L (ref 0–37)
Albumin: 3.7 g/dL (ref 3.5–5.2)
Alkaline Phosphatase: 66 U/L (ref 39–117)
BILIRUBIN TOTAL: 0.3 mg/dL (ref 0.3–1.2)
BUN: 12 mg/dL (ref 6–23)
CALCIUM: 9.2 mg/dL (ref 8.4–10.5)
CHLORIDE: 103 meq/L (ref 96–112)
CO2: 23 mEq/L (ref 19–32)
CREATININE: 0.85 mg/dL (ref 0.50–1.35)
GFR calc non Af Amer: >90 mL/min (ref >90)
Glucose, Bld: 75 mg/dL (ref 70–99)
Potassium: 4 mEq/L (ref 3.7–5.3)
Sodium: 142 mEq/L (ref 137–147)
Total Protein: 7.6 g/dL (ref 6.0–8.3)

## 2013-06-21 LAB — SALICYLATE LEVEL: Salicylate Lvl: <2 mg/dL — ABNORMAL LOW (ref 2.8–20.0)

## 2013-06-21 LAB — ACETAMINOPHEN LEVEL: Acetaminophen (Tylenol), Serum: <15 ug/mL (ref 10–30)

## 2013-06-21 MED ORDER — GI COCKTAIL ~~LOC~~
30.0000 mL | Freq: Once | ORAL | Status: AC
  Administered 2013-06-21: 30 mL via ORAL
  Filled 2013-06-21: qty 30

## 2013-06-21 MED ORDER — ALUM & MAG HYDROXIDE-SIMETH 200-200-20 MG/5ML PO SUSP: 30.0000 mL | ORAL | Status: DC | PRN

## 2013-06-21 MED ORDER — LORAZEPAM 1 MG PO TABS: 1.0000 mg | ORAL_TABLET | Freq: Three times a day (TID) | ORAL | Status: DC | PRN

## 2013-06-21 MED ORDER — WHITE PETROLATUM GEL
Status: DC | PRN
  Filled 2013-06-21: qty 5

## 2013-06-21 MED ORDER — ACETAMINOPHEN 325 MG PO TABS: 650.0000 mg | ORAL_TABLET | ORAL | Status: DC | PRN

## 2013-06-21 MED ORDER — FAMOTIDINE 20 MG PO TABS
20.0000 mg | ORAL_TABLET | Freq: Two times a day (BID) | ORAL | Status: DC
  Administered 2013-06-21 (×2): 20 mg via ORAL
  Filled 2013-06-21 (×2): qty 1

## 2013-06-21 MED ORDER — ONDANSETRON HCL 4 MG PO TABS: 4.0000 mg | ORAL_TABLET | Freq: Three times a day (TID) | ORAL | Status: DC | PRN

## 2013-06-21 NOTE — ED Notes (Signed)
Case management at bedside.

## 2013-06-21 NOTE — ED Notes (Signed)
BREAKFAST ORDERED FOR PT 
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
## 2013-06-21 NOTE — ED Notes (Signed)
Patient transported to X-ray. Sitter with pt.

## 2013-06-21 NOTE — ED Notes (Signed)
SOCIAL WORK AND CASE MANAGEMENT INVOLVED WITH PT DISCHARGE PLAN/CARE PLAN

## 2013-06-21 NOTE — ED Notes (Signed)
Pt has finished his tts.. We are currently waiting to find out bh reccomendations for pt

## 2013-06-21 NOTE — ED Notes (Signed)
Pt was seen here 4 days ago and sent to "another facility" for 3 days for tx of SI and etoh/cocaine abuse.  States as soon as he was released he continued drinking 12 40's per day and abusing crack cocaine.  Pt states he drinks to kill himself and he still feels like killing himself.  Also c/o stabbing epigastric pain.

## 2013-06-21 NOTE — ED Provider Notes (Signed)
[facility] currently looking for RTS or fellowship hall placement.  Not currently suicidal.  [name], MD 06/21/13 [phone]

## 2013-06-21 NOTE — ED Notes (Signed)
Move pt to C20

## 2013-06-21 NOTE — ED Notes (Signed)
Pts belongings placed in bag and put at the Nurses Station.

## 2013-06-21 NOTE — ED Provider Notes (Signed)
6:00 PM  Pt here for SI substance abuse. He is requesting detox. He currently denies SI. Plan was to be sent from his emergency department to be evaluated by psychiatry for possible placement for detox. He has no current medical complaints. He is hemodynamically stable.  Layla MawKristen N Ward, DO 06/21/13 [phone]
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
## 2013-06-21 NOTE — BH Assessment (Signed)
[facility] Assessment Progress Note  At 13:53 I spoke to EDP Gwyneth SproutWhitney Plunkett, MD in anticipation of TTS assessment.  Doylene Canninghomas Johnmichael Melhorn, MA Triage Specialist 06/21/2013 @ 13:55

## 2013-06-21 NOTE — Progress Notes (Signed)
ED CM noted patient not to have a PCP and  have had 17 ED visits in 6 months. Pt presents to [facility] ED with complaints of SI and abdominal pain.  Abdominal series results no acute findings. TTS ordered and pending.  Met with patient at bedside discussed Patient confirmed information. Discussed with patient the importance and  benefits of Primary care. Pt verbalized understanding and agrees. Provided patient with list of affordable primary care practices in the local area. Pt states, he is interested in the [facility]. Pt given [place] brochure. Offered to assist with scheduling an appt. Pt verbalizes appreciation for the assistance with finding a PCP. Pt states he does not have a phone. Appt scheduled at [facility] 4/13 at pm. Pt made aware,  Provided my contact information if he should have any concerns or questions. No further ED CM need identified.    Agencies that provide inexpensive medical care:  Organization Address Phone Notes  [place]   [phone]    [facility]   [phone]    [facility]  [place], [place] [address]  [phone]    [place] [address],  [place]  [phone]    [facility]anned [facility]   [phone]    [facility]   [phone]    [place] [address] [place] [address], [place]  Phone: [phone], Fax: [phone]  Hours of Operation: 9 am - 6 pm, M-F. Also accepts Medicaid/Medicare and self-pay.   [facility] for [place] [address], [address], [place]  Phone: [phone], Fax: [phone].  Hours of Operation: 8:30 am - 5:30 pm, M-F. Also accepts Medicaid and self-pay.   [facility]  [address], [place]  Phone: [phone]    [place], [place], [place]  [phone], Ext. [phone]  Mondays & Thursdays: 7-9 AM. First 15 patients are seen on a first come, first  serve basis.   [place] Providers:  Organization Address Phone Notes  [facility]  [address], [place]  [phone]  Also accepts self-pay patients.   [facility]  [address], [place]  [phone]    [place], [address], [place]  [phone]    [facility]  [address] [place]  [phone]    Lucianne Lei  [address], [place]  [phone]  Only accepts [place] patients after they have their name applied to their card.   Self-Pay (no insurance) in [facility]:  Organization Address Phone Notes  Sickle Cell Patients, [facility]  [place]  [phone]    [facility]  [place]  [phone]    [facility] [place]  [place], [place], [place]  [phone]    [facility]/Dr. Osei-Bonsu  [address] [place] or [address], [place]  [phone]  Phone number for both [place] and [place] locations is the same.   [facility]  [address], [place]  [phone]    [place]  [address] or [address]  [phone]  [phone]    [facility]  [place]  [phone], phone; [phone], fax  Sees patients 1st and 3rd Saturday of every month. Must not qualify for public or private insurance (i.e. Medicaid, Medicare, [place] [employer] Choice, Veterans' Benefits) . Household income should be no more than 200% of the poverty level .The clinic cannot treat you if you are pregnant or think you are pregnant . Sexually transmitted diseases are not treated at the clinic.

## 2013-06-21 NOTE — ED Notes (Signed)
TTS IS AWARE OF PT AND HE IS ON THE LIST FOR ASSESSMENT THIS MORNING

## 2013-06-21 NOTE — ED Notes (Signed)
MD at bedside. 
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
## 2013-06-21 NOTE — Progress Notes (Signed)
CSW spoke with pt in regards to visit to ED. Pt reports he continues to drink and is depressed. CSW spoke with pt in regards to a program called [facility] [employer]. This program will assist pt.'s in getting [facility] services which includes, psychiatry, SA counselor, housing specialist, RN to assist with medication management and job coach. Pt will like to pursue. CSW placed call [facility] to start assessment process.Pt to be tele-psyched. Pt wants inpt for SA. Will continue to follow as necessary.    Luberta Grabinski, [place] [employer]CS[place]

## 2013-06-21 NOTE — Progress Notes (Signed)
[facility] not able to meet with pt today. CSW will give [facility] pt.'s phone to contact. This information shared with pt who consented and voiced understanding.    [address]Adam [name], [place] [phone]

## 2013-06-21 NOTE — ED Notes (Signed)
PT ADMITS TO COURT DATES ON 4/10 AND 4/24

## 2013-06-21 NOTE — ED Notes (Signed)
Per Lajuana RippleBrenda RN. Communication with team about Pt.

## 2013-06-22 ENCOUNTER — Encounter (HOSPITAL_COMMUNITY): Payer: Self-pay | Admitting: Psychiatry

## 2013-06-22 DIAGNOSIS — F1010 Alcohol abuse, uncomplicated: Secondary | ICD-10-CM

## 2013-06-22 DIAGNOSIS — F1420 Cocaine dependence, uncomplicated: Secondary | ICD-10-CM

## 2013-06-22 DIAGNOSIS — F1994 Other psychoactive substance use, unspecified with psychoactive substance-induced mood disorder: Secondary | ICD-10-CM

## 2013-06-22 DIAGNOSIS — F1910 Other psychoactive substance abuse, uncomplicated: Secondary | ICD-10-CM
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
## 2013-06-25 ENCOUNTER — Ambulatory Visit: Payer: Self-pay | Admitting: Internal Medicine

## 2013-12-08 ENCOUNTER — Emergency Department (HOSPITAL_COMMUNITY): Payer: Self-pay

## 2013-12-08 ENCOUNTER — Encounter (HOSPITAL_COMMUNITY): Payer: Self-pay | Admitting: Emergency Medicine

## 2013-12-08 ENCOUNTER — Emergency Department (HOSPITAL_COMMUNITY)
Admission: EM | Admit: 2013-12-08 | Discharge: 2013-12-08 | Disposition: A | Discharge status: Home / Self Care | Payer: Self-pay | Attending: Emergency Medicine | Admitting: Emergency Medicine

## 2013-12-08 DIAGNOSIS — N5089 Other specified disorders of the male genital organs: Secondary | ICD-10-CM | POA: Insufficient documentation

## 2013-12-08 DIAGNOSIS — Z792 Long term (current) use of antibiotics: Secondary | ICD-10-CM | POA: Insufficient documentation

## 2013-12-08 DIAGNOSIS — N453 Epididymo-orchitis: Secondary | ICD-10-CM | POA: Insufficient documentation

## 2013-12-08 DIAGNOSIS — N451 Epididymitis: Secondary | ICD-10-CM

## 2013-12-08 DIAGNOSIS — Z8659 Personal history of other mental and behavioral disorders: Secondary | ICD-10-CM | POA: Insufficient documentation

## 2013-12-08 DIAGNOSIS — F17200 Nicotine dependence, unspecified, uncomplicated: Secondary | ICD-10-CM | POA: Insufficient documentation

## 2013-12-08 DIAGNOSIS — Z87828 Personal history of other (healed) physical injury and trauma: Secondary | ICD-10-CM | POA: Insufficient documentation

## 2013-12-08 DIAGNOSIS — Z8739 Personal history of other diseases of the musculoskeletal system and connective tissue: Secondary | ICD-10-CM | POA: Insufficient documentation

## 2013-12-08 DIAGNOSIS — Z791 Long term (current) use of non-steroidal anti-inflammatories (NSAID): Secondary | ICD-10-CM | POA: Insufficient documentation

## 2013-12-08 LAB — I-STAT CHEM 8, ED
BUN: 5 mg/dL — ABNORMAL LOW (ref 6–23)
CALCIUM ION: 1.13 mmol/L (ref 1.12–1.23)
CREATININE: 0.8 mg/dL (ref 0.50–1.35)
Chloride: 103 mEq/L (ref 96–112)
GLUCOSE: 96 mg/dL (ref 70–99)
HCT: 45 % (ref 39.0–52.0)
HEMOGLOBIN: 15.3 g/dL (ref 13.0–17.0)
Potassium: 3.8 mEq/L (ref 3.7–5.3)
Sodium: 140 mEq/L (ref 137–147)
TCO2: 25 mmol/L (ref 0–100)

## 2013-12-08 LAB — URINALYSIS, ROUTINE W REFLEX MICROSCOPIC
BILIRUBIN URINE: NEGATIVE
Glucose, UA: NEGATIVE mg/dL
Ketones, ur: NEGATIVE mg/dL
LEUKOCYTES UA: NEGATIVE
NITRITE: NEGATIVE
Protein, ur: NEGATIVE mg/dL
SPECIFIC GRAVITY, URINE: 1.019 (ref 1.005–1.030)
UROBILINOGEN UA: 1 mg/dL (ref 0.0–1.0)
pH: 6 (ref 5.0–8.0)

## 2013-12-08 LAB — URINE MICROSCOPIC-ADD ON

## 2013-12-08 MED ORDER — CEFTRIAXONE SODIUM 250 MG IJ SOLR
250.0000 mg | Freq: Once | INTRAMUSCULAR | Status: AC
Start: 2013-12-08 — End: 2013-12-08
  Administered 2013-12-08: 250 mg via INTRAMUSCULAR
  Filled 2013-12-08: qty 250

## 2013-12-08 MED ORDER — NAPROXEN 375 MG PO TABS: 375.0000 mg | ORAL_TABLET | Freq: Two times a day (BID) | ORAL | Status: DC

## 2013-12-08 MED ORDER — STERILE WATER FOR INJECTION IJ SOLN
INTRAMUSCULAR | Status: AC
  Administered 2013-12-08: 0.9 mL
  Filled 2013-12-08: qty 10

## 2013-12-08 MED ORDER — DOXYCYCLINE HYCLATE 100 MG PO CAPS: 100.0000 mg | ORAL_CAPSULE | Freq: Two times a day (BID) | ORAL | Status: DC

## 2013-12-08 NOTE — Discharge Instructions (Signed)
Epididymitis °Epididymitis is a swelling (inflammation) of the epididymis. The epididymis is a cord-like structure along the back part of the testicle. Epididymitis is usually, but not always, caused by infection. This is usually a sudden problem beginning with chills, fever and pain behind the scrotum and in the testicle. There may be swelling and redness of the testicle. °DIAGNOSIS  °Physical examination will reveal a tender, swollen epididymis. Sometimes, cultures are obtained from the urine or from prostate secretions to help find out if there is an infection or if the cause is a different problem. Sometimes, blood work is performed to see if your white blood cell count is elevated and if a germ (bacterial) or viral infection is present. Using this knowledge, an appropriate medicine which kills germs (antibiotic) can be chosen by your caregiver. A viral infection causing epididymitis will most often go away (resolve) without treatment. °HOME CARE INSTRUCTIONS  °· Hot sitz baths for 20 minutes, 4 times per day, may help relieve pain. °· Only take over-the-counter or prescription medicines for pain, discomfort or fever as directed by your caregiver. °· Take all medicines, including antibiotics, as directed. Take the antibiotics for the full prescribed length of time even if you are feeling better. °· It is very important to keep all follow-up appointments. °SEEK IMMEDIATE MEDICAL CARE IF:  °· You have a fever. °· You have pain not relieved with medicines. °· You have any worsening of your problems. °· Your pain seems to come and go. °· You develop pain, redness, and swelling in the scrotum and surrounding areas. °MAKE SURE YOU:  °· Understand these instructions. °· Will watch your condition. °· Will get help right away if you are not doing well or get worse. °Document Released: 02/27/2000 Document Revised: 05/24/2011 Document Reviewed: 01/16/2009 °[employer]® Patient Information ©2015 [employer]. This information  is not intended to replace advice given to you by your health care provider. Make sure you discuss any questions you have with your health care provider. ° °

## 2013-12-08 NOTE — ED Provider Notes (Signed)
Medical screening examination/treatment/procedure(s) were performed by non-physician practitioner and as supervising physician I was immediately available for consultation/collaboration.     Gilford Lardizabal, MD 12/08/13 1553 
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
## 2013-12-08 NOTE — ED Notes (Signed)
Pt c/o swelling in testicle/groin area since last night around 9 pm.  Pt states that he has not had sexual relations outside of current partner.  Pt does not c/o pain nor burning with urination/blood in urine.  Pt states this just happened out of the blue.  He has been test for STDs in past week or two because of incarceration.  No discharge seen by pt.  Pt took Benadryl (2) around 9 pm last night as well as alcohol and medicated powder for relief of swelling with no relief.

## 2013-12-11 LAB — GC/CHLAMYDIA PROBE AMP
CT Probe RNA: NEGATIVE
GC Probe RNA: NEGATIVE

## 2014-01-04 ENCOUNTER — Emergency Department (HOSPITAL_COMMUNITY): Payer: Self-pay

## 2014-01-04 ENCOUNTER — Encounter (HOSPITAL_COMMUNITY): Payer: Self-pay | Admitting: Emergency Medicine

## 2014-01-04 ENCOUNTER — Emergency Department (HOSPITAL_COMMUNITY)
Admission: EM | Admit: 2014-01-04 | Discharge: 2014-01-04 | Disposition: A | Discharge status: Home / Self Care | Payer: Federal, State, Local not specified - Other | Attending: Emergency Medicine | Admitting: Emergency Medicine

## 2014-01-04 DIAGNOSIS — F10281 Alcohol dependence with alcohol-induced sexual dysfunction: Secondary | ICD-10-CM

## 2014-01-04 DIAGNOSIS — F14929 Cocaine use, unspecified with intoxication, unspecified: Secondary | ICD-10-CM | POA: Insufficient documentation

## 2014-01-04 DIAGNOSIS — Z8659 Personal history of other mental and behavioral disorders: Secondary | ICD-10-CM | POA: Insufficient documentation

## 2014-01-04 DIAGNOSIS — Z87828 Personal history of other (healed) physical injury and trauma: Secondary | ICD-10-CM | POA: Insufficient documentation

## 2014-01-04 DIAGNOSIS — F1410 Cocaine abuse, uncomplicated: Secondary | ICD-10-CM

## 2014-01-04 DIAGNOSIS — Z720 Tobacco use: Secondary | ICD-10-CM | POA: Insufficient documentation

## 2014-01-04 DIAGNOSIS — T1491 Suicide attempt: Secondary | ICD-10-CM | POA: Insufficient documentation

## 2014-01-04 DIAGNOSIS — F10220 Alcohol dependence with intoxication, uncomplicated: Secondary | ICD-10-CM

## 2014-01-04 DIAGNOSIS — F14120 Cocaine abuse with intoxication, uncomplicated: Secondary | ICD-10-CM

## 2014-01-04 DIAGNOSIS — F1994 Other psychoactive substance use, unspecified with psychoactive substance-induced mood disorder: Secondary | ICD-10-CM

## 2014-01-04 DIAGNOSIS — F1910 Other psychoactive substance abuse, uncomplicated: Secondary | ICD-10-CM

## 2014-01-04 DIAGNOSIS — R079 Chest pain, unspecified: Secondary | ICD-10-CM | POA: Insufficient documentation

## 2014-01-04 LAB — I-STAT CHEM 8, ED
BUN: 11 mg/dL (ref 6–23)
Calcium, Ion: 1.11 mmol/L — ABNORMAL LOW (ref 1.12–1.23)
Chloride: 101 mEq/L (ref 96–112)
Creatinine, Ser: 1 mg/dL (ref 0.50–1.35)
Glucose, Bld: 60 mg/dL — ABNORMAL LOW (ref 70–99)
HCT: 48 % (ref 39.0–52.0)
Hemoglobin: 16.3 g/dL (ref 13.0–17.0)
Potassium: 5.1 mEq/L (ref 3.7–5.3)
SODIUM: 135 meq/L — AB (ref 137–147)
TCO2: 23 mmol/L (ref 0–100)

## 2014-01-04 LAB — I-STAT TROPONIN, ED: Troponin i, poc: 0 ng/mL (ref 0.00–0.08)

## 2014-01-04 LAB — CBC
HCT: 40.6 % (ref 39.0–52.0)
Hemoglobin: 14.5 g/dL (ref 13.0–17.0)
MCH: 29.6 pg (ref 26.0–34.0)
MCHC: 35.7 g/dL (ref 30.0–36.0)
MCV: 82.9 fL (ref 78.0–100.0)
Platelets: 226 10*3/uL (ref 150–400)
RBC: 4.9 MIL/uL (ref 4.22–5.81)
RDW: 14.7 % (ref 11.5–15.5)
WBC: 12 10*3/uL — ABNORMAL HIGH (ref 4.0–10.5)

## 2014-01-04 LAB — RAPID URINE DRUG SCREEN, HOSP PERFORMED
AMPHETAMINES: NOT DETECTED
BENZODIAZEPINES: NOT DETECTED
Barbiturates: NOT DETECTED
COCAINE: POSITIVE — AB
Opiates: NOT DETECTED
Tetrahydrocannabinol: NOT DETECTED

## 2014-01-04 LAB — ETHANOL: ALCOHOL ETHYL (B): 64 mg/dL — AB (ref 0–11)

## 2014-01-04 NOTE — ED Notes (Addendum)
C/o substernal cp - non radiating; Drinking etoh heavily, increase crack and cocaine use for last few days. Feeling suicidal and depressed. Trying to kill himself by running in front of a car. EMS gave 324 mg of asa; they did not give ntg. Sl. 0.4 mg.  Last used cocaine/crack was ~ 0600.

## 2014-01-04 NOTE — ED Notes (Signed)
Dr Elsie Saasjonnalagadda at bedside.

## 2014-01-04 NOTE — Discharge Instructions (Signed)
We saw you in the ER for your mental health concerns and had our behavioral health team evaluate you. The team feels comfortable sending you home, please follow the recommendations given to you by them and the follow-up and medications they have prescribed to you. Please refrain from substance abuse. Return to the ER if your symptoms worsen.    Polysubstance Abuse When people abuse more than one drug or type of drug it is called polysubstance or polydrug abuse. For example, many smokers also drink alcohol. This is one form of polydrug abuse. Polydrug abuse also refers to the use of a drug to counteract an unpleasant effect produced by another drug. It may also be used to help with withdrawal from another drug. People who take stimulants may become agitated. Sometimes this agitation is countered with a tranquilizer. This helps protect against the unpleasant side effects. Polydrug abuse also refers to the use of different drugs at the same time.  Anytime drug use is interfering with normal living activities, it has become abuse. This includes problems with family and friends. Psychological dependence has developed when your mind tells you that the drug is needed. This is usually followed by physical dependence which has developed when continuing increases of drug are required to get the same feeling or "high". This is known as addiction or chemical dependency. A person's risk is much higher if there is a history of chemical dependency in the family. SIGNS OF CHEMICAL DEPENDENCY  You have been told by friends or family that drugs have become a problem.  You fight when using drugs.  You are having blackouts (not remembering what you do while using).  You feel sick from using drugs but continue using.  You lie about use or amounts of drugs (chemicals) used.  You need chemicals to get you going.  You are suffering in work performance or in school because of drug use.  You get sick from use of  drugs but continue to use anyway.  You need drugs to relate to people or feel comfortable in social situations.  You use drugs to forget problems. "Yes" answered to any of the above signs of chemical dependency indicates there are problems. The longer the use of drugs continues, the greater the problems will become. If there is a family history of drug or alcohol use, it is best not to experiment with these drugs. Continual use leads to tolerance. After tolerance develops more of the drug is needed to get the same feeling. This is followed by addiction. With addiction, drugs become the most important part of life. It becomes more important to take drugs than participate in the other usual activities of life. This includes relating to friends and family. Addiction is followed by dependency. Dependency is a condition where drugs are now needed not just to get high, but to feel normal. Addiction cannot be cured but it can be stopped. This often requires outside help and the care of professionals. Treatment centers are listed in the yellow pages under: Cocaine, Narcotics, and Alcoholics Anonymous. Most hospitals and clinics can refer you to a specialized care center. Talk to your caregiver if you need help. Document Released: 10/21/2004 Document Revised: 05/24/2011 Document Reviewed: 03/01/2005 [facility] Patient Information 2015 [place], [place]. This information is not intended to replace advice given to you by your health care provider. Make sure you discuss any questions you have with your health care provider.   Emergency Department Resource Guide 1) Find a Doctor and Pay Out  of Pocket Although you won't have to find out who is covered by your insurance plan, it is a good idea to ask around and get recommendations. You will then need to call the office and see if the doctor you have chosen will accept you as a new patient and what types of options they offer for patients who are self-pay. Some doctors  offer discounts or will set up payment plans for their patients who do not have insurance, but you will need to ask so you aren't surprised when you get to your appointment.  2) Contact Your Local Health Department Not all health departments have doctors that can see patients for sick visits, but many do, so it is worth a call to see if yours does. If you don't know where your local health department is, you can check in your phone book. The CDC also has a tool to help you locate your state's health department, and many state websites also have listings of all of their local health departments.  3) Find a Walk-in Clinic If your illness is not likely to be very severe or complicated, you may want to try a walk in clinic. These are popping up all over the country in pharmacies, drugstores, and shopping centers. They're usually staffed by nurse practitioners or physician assistants that have been trained to treat common illnesses and complaints. They're usually fairly quick and inexpensive. However, if you have serious medical issues or chronic medical problems, these are probably not your best option.  No Primary Care Doctor: - Call Health Connect at  [phone] - they can help you locate a primary care doctor that  accepts your insurance, provides certain services, etc. - Physician Referral Service- [phone]  Chronic Pain Problems: Organization         Address  Phone   Notes  [facility]  [phone] Patients need to be referred by their primary care doctor.   Medication Assistance: Organization         Address  Phone   Notes  [facility] [address] [place], [place] [address] [phone] --Must be a resident of [facility] -- Must have NO insurance coverage whatsoever (no Medicaid/ Medicare, etc.) -- The pt. MUST have a primary care doctor that directs their care regularly and follows them in the community    [facility]  [phone]   [employer]  [phone]    Agencies that provide inexpensive medical care: Organization         Address  Phone   Notes  [facility]  [phone]   [facility]    [phone]   [facility] [address] [place], [place] [address] [phone]   [facility] [address] [place] [phone]   Planned Parenthood    [phone]   [facility]    [phone]   [facility]  [address], [place] Phone:  [phone], Fax:  [phone] Hours of Operation:  9 am - 6 pm, M-F.  Also accepts Medicaid/Medicare and self-pay.  [facility]  [address], [place] Phone: [phone], Fax: [phone]. Hours of Operation:  8:30 am - 5:30 pm, M-F.  Also accepts Medicaid and self-pay.  [facility] [place] [address] [employer] Phone: [phone]   [facility] [address], [place], [place] [phone], Ext. [phone] Mondays & Thursdays: 7-9 AM.  First 15 patients are seen on a first come, first serve basis.    Medicaid-accepting [facility] Providers:  Organization         Address  Phone   Notes  [facility] [address], [place] [phone] Also accepts self-pay patients.  [facility] [address], [place]  [phone]) [phone]   [facility] [address], [place] [phone]) [phone]   [facility] [address] [place] [address], [place] [phone]   Renaye RakersVeita Bland [address], [place]   [phone] Only accepts [place] Access [place] patients after they have their name applied to their card.   Self-Pay (no insurance) in [facility]:  Organization         Address  Phone   Notes  Sickle Cell Patients, [facility] [address], [place] [phone]) [phone]   [facility] [address], [place] [phone]   [facility]  [address], [place] [phone]   [facility]/Dr. Osei-Bonsu  [address] [place] [address], [place] or [phone], [place] [phone]) [phone] Phone number for both [place] and [place] locations is the same.  [facility] [address], [place] [phone]) [phone]   [facility] [address] [place] [address], [place] or [address] [phone]   [facility] [address], [place] [phone]) [phone], phone; [phone](336) [phone], fax Sees patients 1st and 3rd Saturday of every month.  Must not qualify for public or private insurance (i.e. Medicaid, Medicare, [place] Health Choice, Veterans' Benefits)  Household income should be no more than 200% of the poverty level The clinic cannot treat you if you are pregnant or think you are pregnant  Sexually transmitted diseases are not treated at the clinic.    Dental Care: Organization         Address  Phone  Notes  [facility] Department of [facility] [address], [place] [phone]) [phone] Accepts children up to age 30 who are enrolled in [place] or [place] Health Choice; pregnant women with a Medicaid card; and children who have applied for Medicaid or [place] Health Choice, but were declined, whose parents can pay a reduced fee at time of service.  [facility] of [facility] [place]  [address], [place] [phone]) [phone] Accepts children up to age 30 who are enrolled in [place] or [place] Health Choice; pregnant women with a Medicaid card; and children who have applied for Medicaid or [place] Health Choice, but were declined, whose parents can pay a reduced fee at time of service.  [facility]  [address], [place] [phone]) [phone] Patients are seen by appointment only. Walk-ins are not accepted. [facility] will see patients 30 years of age and older. Monday - Tuesday (8am-5pm) Most Wednesdays (8:30-5pm) $30 per visit, cash only  [facility]  [address], [facility] [phone] Patients are seen by appointment only. Walk-ins are not accepted. [facility] will see patients 30 years of age and older. One Wednesday Evening (Monthly: Volunteer Based).  $30 per visit, cash  only  [employer] of Dentistry Clinics  [phone] for adults; Children under age 53, call Graduate Pediatric Dentistry at [phone]. Children aged 45-14, please call [phone] to request a pediatric application.  Dental services are provided in all areas of dental care including fillings, crowns and bridges, complete and partial dentures, implants, gum treatment, root canals, and extractions. Preventive care is also provided. Treatment is provided to both adults and children. Patients are selected via a lottery and there is often a waiting list.   [facility] [address] [place]  [phone] [email or web address]   [facility] [address] [place], [place] [phone], Ext. [phone] Second and Fourth Thursday of each month, opens at 6:30 AM; Clinic ends at 9 AM.  Patients are seen on a first-come first-served basis, and a limited number are seen during each clinic.   [facility]  [address] [place], [place] [phone]   Eligibility Requirements You must have lived in [place], [place], or [place] counties for at least the last three months.   You cannot be eligible for state or federal sponsored [place], including [employer], [place], or [employer].   You generally cannot be eligible for healthcare insurance through your employer.    How to apply: Eligibility screenings are held every Tuesday and  Wednesday afternoon from 1:00 pm until 4:00 pm. You do not need an appointment for the interview!  [facility] [address] [place], [place] [phone]   [facility] Health Department  [phone]   [facility] Health Department  [phone]   [facility] Health Department  [phone]    Behavioral Health Resources in the Community: Intensive Outpatient Programs Organization         Address  Phone  Notes  [facility] [address], [place], [place] [phone]   [facility] Outpatient [address] [place], [place] [phone]   [facility] [address], [place], [place]  [phone]   [facility] Mental Health [address],  [place], [place] [phone] or [phone]   Substance Abuse Resources Organization         Address  Phone  Notes  Alcohol [facility] Drug Services  [phone]   [facility]  [phone]   [facility]  [phone]   Floydene Flock  [phone]   Residential & Outpatient Substance Abuse Program  [phone]   Psychological Services Organization         Address  Phone  Notes  [facility]  [phone]   [facility]  [phone]   [facility] Mental Health [address] [place] [phone] or [phone]    Mobile Crisis Teams Organization         Address  Phone  Notes  [facility], Mobile Crisis Care Unit  [phone]   [facility]  [address] [place], [place] [phone]   Doristine Locks [address] [place] [phone]    Self-Help/Support Groups Organization         Address  Phone             Notes  [facility] [place] - variety of support groups  [phone]- [phone] Call for more information  Narcotics Anonymous (NA), [facility] [address], [employer] [place]  2 meetings at this location   Risk manager  Address  Phone  Notes  [facility] [address],    [place]  [phone]   [facility]  [address], [place] [phone], [place], [place] [phone]   [facility] [address], [place] [phone] Admissions: 8am-3pm M-F  [facility] [address]    [place], [place] [phone]   [facility] [address], [place], [place] [phone]   [facility] [address]  [place], [place] [phone]   [facility] - Intensive Outpatient [address], [place], [place] [phone]   [facility] ([facility] [address]  [place], [place] [phone] or [phone]   [facility] ([facility]) [address] [place], [place] [phone] Accepts Medicaid  [facility] [address]  [place] [phone] Substance Abuse/Addiction Treatment   [facility] Behavioral Health Resources Organization         Address  Phone  Notes  [facility]  [phone]) [phone]   Angie FavaJulie Brannon, PhD [address] [place], [place]   [phone]) [phone] or [phone](336) [phone]   [facility]   [address] [place], [place] [phone]) [phone]   [name] [address], [place], [place] [phone]) [phone] Insurance/Medicaid/sponsorship through [facility]  Faith and Families [address]                                    [place], [place] [phone] Therapy/tele-psych/case  [facility] [address]   [place], [place] [phone]) [phone]    Dr. Lolly MustacheArfeen  [phone]) [phone]   [facility]  [employer] [facility] 1) [address], [place] 2) [address], [place] 3)  [address], [place] [phone]) [phone]  [phone]) [phone]   [facility] Child Abuse Hotline [phone]) [phone] or [phone](336) [phone] (After Hours)
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
## 2014-01-04 NOTE — ED Notes (Signed)
Patient up to phone to call his girlfriend (asked me to call her but explained he would need to speak to her). He is overheard apoligizing multiple time telling her "i messed up, i'm sorry". Became agitated when got off phone saying "f@#$ that [id number]$%&$".

## 2014-01-04 NOTE — ED Notes (Signed)
Patient reports he has been depressed for the past year. He agrees that we have placed him in facilities in the past year but he has not done any follow up as far as seeing a counselor, psychiatrist etc. He states he has been drinking 8-9 40 ounce beers every day. (bac 64) on admit, he also states he did about $300 worth of crack cocaine yesterday and into the night. States he does does not smoke crack on a daily basis. States only "when i am drunk" . States does use it a couple times a week.  He states he is not homeless, that he lives with his girlfriend. He is currently unemployed.

## 2014-01-04 NOTE — ED Notes (Signed)
Pt was served lunch
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
## 2014-01-04 NOTE — ED Provider Notes (Signed)
Pt comes in w/ polysubstance abuse and SI. Medically cleared by Dr. Radford PaxBeaton. Dr. Addison NaegeliJonalagadda, Psych clears the patient.  Per psych eval: "Patient denied suicidal ideation, homicidal ideation, intentions or plans. Patient has no evidence of psychosis."  Will d.c.  Adam KaplanAnkit Emalea Mix, MD 01/04/14 [phone]

## 2014-05-16 ENCOUNTER — Encounter (HOSPITAL_COMMUNITY): Payer: Self-pay | Admitting: Emergency Medicine

## 2014-05-16 ENCOUNTER — Emergency Department (HOSPITAL_COMMUNITY)
Admission: EM | Admit: 2014-05-16 | Discharge: 2014-05-16 | Disposition: A | Discharge status: Home / Self Care | Payer: Self-pay | Attending: Emergency Medicine | Admitting: Emergency Medicine

## 2014-05-16 DIAGNOSIS — Z720 Tobacco use: Secondary | ICD-10-CM | POA: Insufficient documentation

## 2014-05-16 DIAGNOSIS — Z87828 Personal history of other (healed) physical injury and trauma: Secondary | ICD-10-CM | POA: Insufficient documentation

## 2014-05-16 DIAGNOSIS — F1410 Cocaine abuse, uncomplicated: Secondary | ICD-10-CM | POA: Insufficient documentation

## 2014-05-16 DIAGNOSIS — R109 Unspecified abdominal pain: Secondary | ICD-10-CM | POA: Insufficient documentation

## 2014-05-16 DIAGNOSIS — F1910 Other psychoactive substance abuse, uncomplicated: Secondary | ICD-10-CM

## 2014-05-16 DIAGNOSIS — Z8659 Personal history of other mental and behavioral disorders: Secondary | ICD-10-CM | POA: Insufficient documentation

## 2014-05-16 DIAGNOSIS — R55 Syncope and collapse: Secondary | ICD-10-CM | POA: Insufficient documentation

## 2014-05-16 LAB — COMPREHENSIVE METABOLIC PANEL
ALT: 34 U/L (ref 0–53)
AST: 40 U/L — ABNORMAL HIGH (ref 0–37)
Albumin: 3.8 g/dL (ref 3.5–5.2)
Alkaline Phosphatase: 58 U/L (ref 39–117)
Anion gap: 6 (ref 5–15)
BUN: 13 mg/dL (ref 6–23)
CALCIUM: 8.6 mg/dL (ref 8.4–10.5)
CO2: 27 mmol/L (ref 19–32)
Chloride: 101 mmol/L (ref 96–112)
Creatinine, Ser: 0.97 mg/dL (ref 0.50–1.35)
GFR calc non Af Amer: >90 mL/min (ref >90)
GLUCOSE: 118 mg/dL — AB (ref 70–99)
POTASSIUM: 3.5 mmol/L (ref 3.5–5.1)
SODIUM: 134 mmol/L — AB (ref 135–145)
TOTAL PROTEIN: 6.6 g/dL (ref 6.0–8.3)
Total Bilirubin: 0.9 mg/dL (ref 0.3–1.2)

## 2014-05-16 LAB — RAPID URINE DRUG SCREEN, HOSP PERFORMED
Amphetamines: NOT DETECTED
BARBITURATES: NOT DETECTED
Benzodiazepines: NOT DETECTED
Cocaine: POSITIVE — AB
Opiates: NOT DETECTED
Tetrahydrocannabinol: NOT DETECTED

## 2014-05-16 LAB — CBC WITH DIFFERENTIAL/PLATELET
BASOS PCT: 0 % (ref 0–1)
Basophils Absolute: 0 10*3/uL (ref 0.0–0.1)
EOS ABS: 0.1 10*3/uL (ref 0.0–0.7)
Eosinophils Relative: 1 % (ref 0–5)
HCT: 39.1 % (ref 39.0–52.0)
Hemoglobin: 13.9 g/dL (ref 13.0–17.0)
LYMPHS ABS: 1.6 10*3/uL (ref 0.7–4.0)
Lymphocytes Relative: 22 % (ref 12–46)
MCH: 28.8 pg (ref 26.0–34.0)
MCHC: 35.5 g/dL (ref 30.0–36.0)
MCV: 81.1 fL (ref 78.0–100.0)
Monocytes Absolute: 0.8 10*3/uL (ref 0.1–1.0)
Monocytes Relative: 10 % (ref 3–12)
NEUTROS PCT: 67 % (ref 43–77)
Neutro Abs: 4.9 10*3/uL (ref 1.7–7.7)
PLATELETS: 189 10*3/uL (ref 150–400)
RBC: 4.82 MIL/uL (ref 4.22–5.81)
RDW: 13.3 % (ref 11.5–15.5)
WBC: 7.5 10*3/uL (ref 4.0–10.5)
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
## 2014-05-16 NOTE — ED Notes (Addendum)
Pt states he was drinking a lot of alcohol yesterday night and use bunch of drugs that he doesn't remember what type of drugs and he doesn't remember nothing of what happen then. Last time know him self well at 2200 last night. C/o 7/10 left side abd, having n/v.

## 2014-05-16 NOTE — Discharge Instructions (Signed)
Finding Treatment for Alcohol and Drug Addiction °It can be hard to find the right place to get professional treatment. Here are some important things to consider: °· There are different types of treatment to choose from. °· Some programs are live-in (residential) while others are not (outpatient). Sometimes a combination is offered. °· No single type of program is right for everyone. °· Most treatment programs involve a combination of education, counseling, and a 12-step, spiritually-based approach. °· There are non-spiritually based programs (not 12-step). °· Some treatment programs are government sponsored. They are geared for patients without private insurance. °· Treatment programs can vary in many respects such as: °¨ Cost and types of insurance accepted. °¨ Types of on-site medical services offered. °¨ Length of stay, setting, and size. °¨ Overall philosophy of treatment.  °A person may need specialized treatment or have needs not addressed by all programs. For example, adolescents need treatment appropriate for their age. Other people have secondary disorders that must be managed as well. Secondary conditions can include mental illness, such as depression or diabetes. Often, a period of detoxification from alcohol or drugs is needed. This requires medical supervision and not all programs offer this. °THINGS TO CONSIDER WHEN SELECTING A TREATMENT PROGRAM  °· Is the program certified by the appropriate government agency? Even private programs must be certified and employ certified professionals. °· Does the program accept your insurance? If not, can a payment plan be set up? °· Is the facility clean, organized, and well run? Do they allow you to speak with graduates who can share their treatment experience with you? Can you tour the facility? Can you meet with staff? °· Does the program meet the full range of individual needs? °· Does the treatment program address sexual orientation and physical disabilities?  Do they provide age, gender, and culturally appropriate treatment services? °· Is treatment available in languages other than English? °· Is long-term aftercare support or guidance encouraged and provided? °· Is assessment of an individual's treatment plan ongoing to ensure it meets changing needs? °· Does the program use strategies to encourage reluctant patients to remain in treatment long enough to increase the likelihood of success? °· Does the program offer counseling (individual or group) and other behavioral therapies? °· Does the program offer medicine as part of the treatment regimen, if needed? °· Is there ongoing monitoring of possible relapse? Is there a defined relapse prevention program? Are services or referrals offered to family members to ensure they understand addiction and the recovery process? This would help them support the recovering individual. °· Are 12-step meetings held at the center or is transport available for patients to attend outside meetings? °In countries outside of the [place], see local directories for contact information for services in your area. °Document Released: 01/28/2005 Document Revised: 05/24/2011 Document Reviewed: 08/10/2007 °ExitCare® Patient Information ©2015 [employer]. This information is not intended to replace advice given to you by your health care provider. Make sure you discuss any questions you have with your health care provider. ° °Emergency Department Resource Guide °1) Find a Doctor and Pay Out of Pocket °Although you won't have to find out who is covered by your insurance plan, it is a good idea to ask around and get recommendations. You will then need to call the office and see if the doctor you have chosen will accept you as a new patient and what types of options they offer for patients who are self-pay. Some doctors offer discounts or will   set up payment plans for their patients who do not have insurance, but you will need to ask so you  aren't surprised when you get to your appointment. ° °2) Contact Your Local Health Department °Not all health departments have doctors that can see patients for sick visits, but many do, so it is worth a call to see if yours does. If you don't know where your local health department is, you can check in your phone book. The CDC also has a tool to help you locate your state's health department, and many state websites also have listings of all of their local health departments. ° °3) Find a Walk-in Clinic °If your illness is not likely to be very severe or complicated, you may want to try a walk in clinic. These are popping up all over the country in pharmacies, drugstores, and shopping centers. They're usually staffed by nurse practitioners or physician assistants that have been trained to treat common illnesses and complaints. They're usually fairly quick and inexpensive. However, if you have serious medical issues or chronic medical problems, these are probably not your best option. ° °No Primary Care Doctor: °- Call [facility] at  832-8000 - they can help you locate a primary care doctor that  accepts your insurance, provides certain services, etc. °- Physician Referral Service- 1-800-533-3463 ° °Chronic Pain Problems: °Organization         Address  Phone   Notes  °[facility]  (336) 297-2271 Patients need to be referred by their primary care doctor.  ° °Medication Assistance: °Organization         Address  Phone   Notes  °[place] [facility] [address] °[place], [place] [address] (336) 641-8030 --Must be a resident of [place] °-- Must have NO insurance coverage whatsoever (no Medicaid/ Medicare, etc.) °-- The pt. MUST have a primary care doctor that directs their care regularly and follows them in the community °  °[facility]  (866) 331-1348   °[employer]  (888) 892-1162   ° °Agencies that provide inexpensive medical care: °Organization          Address  Phone   Notes  °[facility]  (336) 832-8035   °[facility]    (336) 832-7272   °[facility] [address] °[place], [place] [address] (336) 832-4777   °[facility] [address], °[place] (336) 271-4999   °[facility]    (336) 373-0678   °[facility]    (336) 272-1050   °[facility] ° [address], [place] Phone:  (336) 832-4444, Fax:  (336) 832-4440 Hours of Operation:  9 am - 6 pm, M-F.  Also accepts Medicaid/Medicare and self-pay.  °[facility] ° [address], [place] Phone: (336) 832-3150, Fax: (336) 832-3151. Hours of Operation:  8:30 am - 5:30 pm, M-F.  Also accepts Medicaid and self-pay.  °[facility] [place] [address], [place] Phone: (336) 878-6027   °[facility] [address], [place], [place] (336)723-1848, Ext. 123 Mondays & Thursdays: 7-9 AM.  First 15 patients are seen on a first come, first serve basis. °  ° °Medicaid-accepting [place] Providers: ° °Organization         Address  Phone   Notes  °[facility] [address], [place] (336) 641-2100   Also accepts self-pay patients.  °[facility] [address], [place] ° (336) 856-9996   °[facility] [address], [place] (336) 288-8857   °[facility] [address] [place] [address], [place] (336) 299-7000   °Veita Bland [address], [place]  ° (336) 373-1557 Only accepts [place] Access Medicaid patients after they have their name applied to their card.  ° °Self-Pay (no insurance) in [place]: ° °Organization         Address  Phone   Notes  °Sickle Cell Patients, [facility] [address], [place] (336) 832-1970   °[facility] [address], [place] (336) 832-4400    °[facility] ° [address], [place] (336) 992-4800   °[facility]/Dr. Osei-Bonsu ° [address] [place] [address], [place] or [address], [place] (336) 841-8500 Phone number for both [place] and [place] locations is the same.  °[facility] [address], [place] (336) 299-0000   °[facility] [address] [place] [address], [place] or [address] (336) 852-7530 °(336) 878-2260   °[facility] [address], [place] (336) 350-1642, phone; (336) 294-5005, fax Sees patients 1st and 3rd Saturday of every month.  Must not qualify for public or private insurance (i.e. Medicaid, Medicare, [employer] Health Choice, Veterans' Benefits) • Household income should be no more than 200% of the poverty level •The clinic cannot treat you if you are pregnant or think you are pregnant • Sexually transmitted diseases are not treated at the clinic.  ° ° °Dental Care: °Organization         Address  Phone  Notes  °[facility] [address], [place] (336) 641-6152 Accepts children up to age 21 who are enrolled in Medicaid or [place] Health Choice; pregnant women with a Medicaid card; and children who have applied for Medicaid or [place] Health Choice, but were declined, whose parents can pay a reduced fee at time of service.  °[facility] [place]  [address], [place] (336) 641-7733 Accepts children up to age 21 who are enrolled in Medicaid or [place] Health Choice; pregnant women with a Medicaid card; and children who have applied for Medicaid or [employer] Health Choice, but were declined, whose parents can pay a reduced fee at time of service.  °[facility] ° [address], [place] (336) 641-4533 Patients are seen by appointment only. Walk-ins are not accepted. [facility] will see patients 18  years of age and older. °Monday - Tuesday (8am-5pm) °Most Wednesdays (8:30-5pm) °$30 per visit, cash only  °[facility] ° [address], [place] (336) 641-4533 Patients are seen by appointment only. Walk-ins are not accepted. [facility] will see patients 18 years of age and older. °One Wednesday Evening (Monthly: Volunteer Based).  $30 per visit, cash only  °[facility]  (919) 537-3737 for adults; Children under age 4, call Graduate Pediatric Dentistry at ([phone]) [phone]. Children aged 4-14, please call (919) [phone] to request a pediatric application. ° Dental services are provided in all areas of dental care including fillings, crowns and bridges, complete and partial dentures, implants, gum treatment, root canals, and extractions. Preventive care is also provided. Treatment   is provided to both adults and children. °Patients are selected via a lottery and there is often a waiting list. °  °[facility] [address], °[place] ° (336) 763-8833 [email or web address] °  °[facility] [address], [place], [place] (336)723-1848, Ext. 123 Second and Fourth Thursday of each month, opens at 6:30 AM; Clinic ends at 9 AM.  Patients are seen on a first-come first-served basis, and a limited number are seen during each clinic.  ° °[facility] ° [address], [place],  [place] (336) 723-7904   Eligibility Requirements °You must have lived in [place], [place], or [place] counties for at least the last three months. °  You cannot be eligible for state or federal sponsored healthcare insurance, including Veterans Administration, Medicaid, or Medicare. °  You generally cannot be eligible for healthcare insurance through your employer.  °  How to apply: °Eligibility screenings are held every Tuesday and Wednesday afternoon from 1:00 pm until 4:00 pm. You do not need an appointment for the interview!  °[facility]  [address], [place], [place] 336-631-2330   °[facility]  336-342-8273   °[facility]  336-703-3100   °[facility]  336-570-6415   ° °Behavioral Health Resources in the Community: °Intensive Outpatient Programs °Organization         Address  Phone  Notes  °[facility] [address], [place], [place] 336-878-6098   °[facility] [address], [place], [place] 336-832-9800   °[facility] [address], [place], [place] ° 336-882-2125   °[facility] [address],  °[place], [place] 1-800-853-5163 or 336-641-4981   °Substance Abuse Resources °Organization         Address  Phone  Notes  °[facility]  336-882-2125   °[facility]  336-784-9470   °[facility]  336-285-9073   °[name]  336-845-3988   °Residential & Outpatient Substance Abuse Program  1-800-659-3381   °Psychological Services °Organization         Address  Phone  Notes  °[facility]  336- 832-9600   °[facility]  336- 378-7881   °[facility] [address], [place] 1-800-853-5163 or 336-641-4981   ° °Mobile Crisis Teams °Organization         Address  Phone  Notes  °[facility], Mobile Crisis Care Unit  1-877-626-1772   °Assertive °[facility] ° [address] [place], [place] 336-834-9664   °Sharon DeEsch [address] °[place] 336-554-5454   ° °Self-Help/Support Groups °Organization         Address  Phone             Notes  °[facility] - variety of support groups  336- 373-1402 Call for more information  °Narcotics Anonymous (NA), [facility] [address], °[place]  2 meetings at this location  ° °Residential Treatment Programs °Organization         Address  Phone  Notes  °[facility] [address],    ° [place]   1-866-801-8205   °[facility] ° [address], [place], [place] 704-293-8524   °[name] [address], [place] 336-845-3988 Admissions: 8am-3pm M-F  °[facility] [address]    °[place],   [place] 336-841-1104   °[facility] [address], [place], [place] 336-379-7146   °[facility] [address]  °[place], [place] 336-285-9073   °[facility] Intensive Outpatient [address], [place], [place] 336-852-3033   °[facility] [address]  °[place], [place] 1-877-615-2722 or 336-784-9470   °[facility] [address] [place], [place] 336-227-7417 Accepts Medicaid  °[facility] [address]  ° [place] 1-800-659-3381 Substance Abuse/Addiction Treatment  ° °[place] Behavioral Health Resources °Organization         Address  Phone  Notes  °[facility]  (888) 581-9988   °Julie Brannon, PhD [address] [place], [place]   (336) 349-5553 or (336) 951-0000   °[facility]   [address] °[place], [place] (336) 349-4454   °[name] [address], [place], [place] (336) 342-8316 Insurance/Medicaid/sponsorship through [facility]  °[facility] [address]                                    [place], [place] (336) 342-8316 Therapy/tele-psych/case  °[facility] [address]  ° [place], [place] (336) 349-2233    °Dr. Arfeen  (336) 349-4544   °[facility] [place]  [employer] [place] [facility] 1) [address],  °2) [address], [place] °3)  [address], [place] (336) 349-3220 °(336) 342-7768 ° °(336) 342-8140   °[place] [facility] (336) 342-1394 or (336) 342-3537 (After Hours)    ° ° °

## 2014-05-16 NOTE — ED Notes (Addendum)
Called lab RE etoh results.  All other labs are back.  ETOH was not completed.  Because pt is ao x 4.  No s/s of intoxication, PA stated to cancel lab.

## 2014-05-17 ENCOUNTER — Encounter (HOSPITAL_COMMUNITY): Payer: Self-pay | Admitting: Emergency Medicine

## 2014-05-17 DIAGNOSIS — R000 Tachycardia, unspecified: Secondary | ICD-10-CM | POA: Insufficient documentation

## 2014-05-17 DIAGNOSIS — Z87828 Personal history of other (healed) physical injury and trauma: Secondary | ICD-10-CM | POA: Insufficient documentation

## 2014-05-17 DIAGNOSIS — Z720 Tobacco use: Secondary | ICD-10-CM | POA: Insufficient documentation

## 2014-05-17 DIAGNOSIS — M795 Residual foreign body in soft tissue: Secondary | ICD-10-CM | POA: Insufficient documentation

## 2014-05-17 DIAGNOSIS — R45851 Suicidal ideations: Secondary | ICD-10-CM | POA: Insufficient documentation

## 2014-05-17 LAB — CBC
HEMATOCRIT: 38.5 % — AB (ref 39.0–52.0)
HEMOGLOBIN: 13.9 g/dL (ref 13.0–17.0)
MCH: 29.4 pg (ref 26.0–34.0)
MCHC: 36.1 g/dL — ABNORMAL HIGH (ref 30.0–36.0)
MCV: 81.4 fL (ref 78.0–100.0)
Platelets: 211 10*3/uL (ref 150–400)
RBC: 4.73 MIL/uL (ref 4.22–5.81)
RDW: 13.4 % (ref 11.5–15.5)
WBC: 10.4 10*3/uL (ref 4.0–10.5)

## 2014-05-17 NOTE — ED Notes (Signed)
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 Pt is lethargic in triage. Charge and staffing notified.

## 2014-05-18 ENCOUNTER — Inpatient Hospital Stay (HOSPITAL_COMMUNITY)
Admission: AD | Admit: 2014-05-18 | Discharge: 2014-05-19 | DRG: 897 | Disposition: A | Discharge status: Home / Self Care | Payer: Federal, State, Local not specified - Other | Source: Intra-hospital | Attending: Psychiatry | Admitting: Psychiatry

## 2014-05-18 ENCOUNTER — Encounter (HOSPITAL_COMMUNITY): Payer: Self-pay | Admitting: *Deleted

## 2014-05-18 ENCOUNTER — Emergency Department (HOSPITAL_COMMUNITY)
Admission: EM | Admit: 2014-05-18 | Discharge: 2014-05-18 | Disposition: A | Discharge status: Other Acute Inpatient Hospital | Payer: Self-pay | Attending: Emergency Medicine | Admitting: Emergency Medicine

## 2014-05-18 DIAGNOSIS — F1020 Alcohol dependence, uncomplicated: Secondary | ICD-10-CM | POA: Diagnosis present

## 2014-05-18 DIAGNOSIS — F1910 Other psychoactive substance abuse, uncomplicated: Secondary | ICD-10-CM | POA: Diagnosis present

## 2014-05-18 DIAGNOSIS — F329 Major depressive disorder, single episode, unspecified: Secondary | ICD-10-CM | POA: Diagnosis present

## 2014-05-18 DIAGNOSIS — R45851 Suicidal ideations: Secondary | ICD-10-CM

## 2014-05-18 DIAGNOSIS — F1024 Alcohol dependence with alcohol-induced mood disorder: Principal | ICD-10-CM | POA: Diagnosis present

## 2014-05-18 DIAGNOSIS — F17210 Nicotine dependence, cigarettes, uncomplicated: Secondary | ICD-10-CM | POA: Diagnosis present

## 2014-05-18 LAB — COMPREHENSIVE METABOLIC PANEL
ALK PHOS: 63 U/L (ref 39–117)
ALT: 34 U/L (ref 0–53)
AST: 40 U/L — AB (ref 0–37)
Albumin: 4.1 g/dL (ref 3.5–5.2)
Anion gap: 14 (ref 5–15)
BUN: 10 mg/dL (ref 6–23)
CO2: 22 mmol/L (ref 19–32)
Calcium: 8.6 mg/dL (ref 8.4–10.5)
Chloride: 103 mmol/L (ref 96–112)
Creatinine, Ser: 0.92 mg/dL (ref 0.50–1.35)
GFR calc Af Amer: >90 mL/min (ref >90)
GFR calc non Af Amer: >90 mL/min (ref >90)
GLUCOSE: 119 mg/dL — AB (ref 70–99)
POTASSIUM: 3.6 mmol/L (ref 3.5–5.1)
Sodium: 139 mmol/L (ref 135–145)
Total Bilirubin: 0.4 mg/dL (ref 0.3–1.2)
Total Protein: 6.8 g/dL (ref 6.0–8.3)

## 2014-05-18 LAB — RAPID URINE DRUG SCREEN, HOSP PERFORMED
AMPHETAMINES: NOT DETECTED
BARBITURATES: NOT DETECTED
Benzodiazepines: NOT DETECTED
COCAINE: POSITIVE — AB
OPIATES: NOT DETECTED
Tetrahydrocannabinol: NOT DETECTED

## 2014-05-18 LAB — ETHANOL: Alcohol, Ethyl (B): 90 mg/dL — ABNORMAL HIGH (ref 0–9)

## 2014-05-18 LAB — SALICYLATE LEVEL: Salicylate Lvl: <4 mg/dL (ref 2.8–20.0)

## 2014-05-18 LAB — ACETAMINOPHEN LEVEL

## 2014-05-18 MED ORDER — CHLORDIAZEPOXIDE HCL 25 MG PO CAPS
25.0000 mg | ORAL_CAPSULE | Freq: Four times a day (QID) | ORAL | Status: DC | PRN
  Filled 2014-05-18: qty 1

## 2014-05-18 MED ORDER — ALUM & MAG HYDROXIDE-SIMETH 200-200-20 MG/5ML PO SUSP: 30.0000 mL | ORAL | Status: DC | PRN

## 2014-05-18 MED ORDER — ONDANSETRON 4 MG PO TBDP: 4.0000 mg | ORAL_TABLET | Freq: Four times a day (QID) | ORAL | Status: DC | PRN

## 2014-05-18 MED ORDER — HYDROXYZINE HCL 25 MG PO TABS: 25.0000 mg | ORAL_TABLET | Freq: Four times a day (QID) | ORAL | Status: DC | PRN

## 2014-05-18 MED ORDER — CHLORDIAZEPOXIDE HCL 25 MG PO CAPS
25.0000 mg | ORAL_CAPSULE | Freq: Four times a day (QID) | ORAL | Status: DC
  Administered 2014-05-18 – 2014-05-19 (×3): 25 mg via ORAL
  Filled 2014-05-18 (×2): qty 1

## 2014-05-18 MED ORDER — SODIUM CHLORIDE 0.9 % IV BOLUS (SEPSIS)
2000.0000 mL | Freq: Once | INTRAVENOUS | Status: AC
  Administered 2014-05-18: 2000 mL via INTRAVENOUS

## 2014-05-18 MED ORDER — MAGNESIUM HYDROXIDE 400 MG/5ML PO SUSP: 30.0000 mL | Freq: Every day | ORAL | Status: DC | PRN

## 2014-05-18 MED ORDER — THIAMINE HCL 100 MG/ML IJ SOLN
100.0000 mg | Freq: Once | INTRAMUSCULAR | Status: AC
  Administered 2014-05-18: 100 mg via INTRAMUSCULAR
  Filled 2014-05-18: qty 2

## 2014-05-18 MED ORDER — ADULT MULTIVITAMIN W/MINERALS CH
1.0000 | ORAL_TABLET | Freq: Every day | ORAL | Status: DC
  Filled 2014-05-18 (×2): qty 1

## 2014-05-18 MED ORDER — CHLORDIAZEPOXIDE HCL 25 MG PO CAPS: 25.0000 mg | ORAL_CAPSULE | ORAL | Status: DC

## 2014-05-18 MED ORDER — ADULT MULTIVITAMIN W/MINERALS CH
1.0000 | ORAL_TABLET | Freq: Every day | ORAL | Status: DC
  Administered 2014-05-18 – 2014-05-19 (×2): 1 via ORAL
  Filled 2014-05-18 (×5): qty 1

## 2014-05-18 MED ORDER — VITAMIN B-1 100 MG PO TABS
100.0000 mg | ORAL_TABLET | Freq: Every day | ORAL | Status: DC
  Administered 2014-05-19: 100 mg via ORAL
  Filled 2014-05-18 (×3): qty 1

## 2014-05-18 MED ORDER — LOPERAMIDE HCL 2 MG PO CAPS: 2.0000 mg | ORAL_CAPSULE | ORAL | Status: DC | PRN

## 2014-05-18 MED ORDER — ACETAMINOPHEN 325 MG PO TABS: 650.0000 mg | ORAL_TABLET | Freq: Four times a day (QID) | ORAL | Status: DC | PRN

## 2014-05-18 MED ORDER — CHLORDIAZEPOXIDE HCL 25 MG PO CAPS: 25.0000 mg | ORAL_CAPSULE | Freq: Every day | ORAL | Status: DC

## 2014-05-18 MED ORDER — CHLORDIAZEPOXIDE HCL 25 MG PO CAPS: 25.0000 mg | ORAL_CAPSULE | Freq: Four times a day (QID) | ORAL | Status: DC | PRN

## 2014-05-18 MED ORDER — CHLORDIAZEPOXIDE HCL 25 MG PO CAPS: 25.0000 mg | ORAL_CAPSULE | Freq: Three times a day (TID) | ORAL | Status: DC

## 2014-05-18 NOTE — ED Notes (Signed)
Ford from behavioral cannot assess the pt due to his sleepiness at present.

## 2014-05-18 NOTE — Clinical Social Work Note (Signed)
CSW attempted PSA with pt. Pt sleeping in room/continues to present as drowsy and disoriented. Unable to complete PSA or discuss aftercare plan/treatment plan with pt at this time.  The Sherwin-WilliamsHeather Smart, LCSWA 05/18/2014 1:37 PM
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
## 2014-05-18 NOTE — [facility] Group Notes (Signed)
Adult Psychoeducational Group Note  Date:  05/18/2014 Time:  1330  Group Topic/Focus:  Life Skills  Participation Level:  Active  Participation Quality:  Appropriate  Affect:  Appropriate  Cognitive:  Appropriate  Insight: Appropriate  Engagement in Group:  Developing/Improving  Modes of Intervention:  Discussion  Additional Comments:  Appropriate  Earline MayotteKnight, Alwaleed Obeso Shephard 05/18/2014, 6:38 PM

## 2014-05-18 NOTE — BH Assessment (Signed)
Received notification of TTS consult request. Attempted to contact Dr. Krista BlueA. Oni and Pt relayed through secretary Marylene Landngela that "he is busy with a Pt and can you call back in an hour." Contacted Christine Chrisco, RN who said Pt is barely rousable and unable to participate in assessment at this time. TTS will follow up with Dr. Mora Bellmanni in one hour.  Harlin RainFord Ellis Ria CommentWarrick Jr, LPC, [facility] Triage Specialist [phone]

## 2014-05-18 NOTE — [facility] Counselor (Signed)
Per Dr. Mora Bellmanni, pt is still sleeping from overdose and is not able to be assessed at this time.  Dr. Mora Bellmanni said he would cancel the TTS Consult and reorder when pt is awake and able to be assessed.  Beryle FlockMary Iyauna Sing, MS, CRC, [facility] Triage Specialist [facility]

## 2014-05-18 NOTE — ED Notes (Signed)
THE PT IS MORE ALERT BUT HE IS VERY DIFFICULT TO UNDERSTAND  AND HE GOES BACK TO SLEEP  AT ANY MOMENT
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
## 2014-05-18 NOTE — ED Notes (Signed)
Pt still sleeping unless   Awakened.  He wakes up then goes right back to sleep.  Sitter at the bedside
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
## 2014-05-18 NOTE — ED Notes (Signed)
Sitter remINS AT THE BEDSIDE.  .  THE PTS WATCH EARBUDS AND HIS ID AND SHELTER PASS PLACED IN AN ENVELOPE AND GIVEN TO SECURITY.  YELLOW SLIP WITH CHART
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
## 2014-05-18 NOTE — Tx Team (Signed)
Initial Interdisciplinary Treatment Plan   PATIENT STRESSORS: Financial difficulties Substance abuse   PATIENT STRENGTHS: Ability for insight Average or above average intelligence Capable of independent living Communication skills Physical Health   PROBLEM LIST: Problem List/Patient Goals Date to be addressed Date deferred Reason deferred Estimated date of resolution  Risk to harm self (Suicide Ideation) 05/18/14     Alteration in Mood (Depressed) 05/18/14     Substance Abuse 05/18/14                                          DISCHARGE CRITERIA:  Ability to meet basic life and health needs Improved stabilization in mood, thinking, and/or behavior Motivation to continue treatment in a less acute level of care Verbal commitment to aftercare and medication compliance Withdrawal symptoms are absent or subacute and managed without 24-hour nursing intervention  PRELIMINARY DISCHARGE PLAN: Outpatient therapy Placement in alternative living arrangements  PATIENT/FAMIILY INVOLVEMENT: This treatment plan has been presented to and reviewed with the patient, Adam [name].  The patient and family have been given the opportunity to ask questions and make suggestions.  Sherryl MangesWesseh, Anastasia Tompson 05/18/2014, 1:42 PM

## 2014-05-18 NOTE — ED Notes (Signed)
Sitter at the bedside.

## 2014-05-18 NOTE — Progress Notes (Signed)
[facility] Group Notes:  (Nursing/MHT/Case Management/Adjunct)  Date:  05/18/2014  Time:  2100 Type of Therapy:  wrap up group  Participation Level:  Active  Participation Quality:  Appropriate, Attentive, Sharing and Supportive  Affect:  Not Congruent  Cognitive:  Alert  Insight:  Improving  Engagement in Group:  Engaged  Modes of Intervention:  Clarification, Education and Support  Summary of Progress/Problems: Pt shows depressed mood but regularly smiles, somewhat at inappropriate times. Pt's affect is flat when not smiling. Pt shares that he plans on discharging to [facility] [place], [place].  Pt has attended before and has success at remaining sober but has relapsed. Pt reports relapse due to hanging around wrong crowd he thought was the right crowd. Other graduates of [facility].  Shelah LewandowskySquires, Deonna Krummel Carol 05/18/2014, 10:39 PM

## 2014-05-18 NOTE — ED Notes (Signed)
The pt is unable to give me clear enough answers to determine what his psy needs are at present

## 2014-05-18 NOTE — ED Notes (Signed)
Attempted report 

## 2014-05-18 NOTE — Plan of Care (Signed)
Problem: Diagnosis: Increased Risk For Suicide Attempt Goal: STG-Patient Will Comply With Medication Regime Outcome: Progressing Pt took his medications as ordered. Verbal education on medications done by RN to increase pt's insight and promote compliance.

## 2014-05-18 NOTE — ED Notes (Signed)
Pt sleeping barely  rousable to questions asked.  He denies taking any med

## 2014-05-18 NOTE — ED Notes (Signed)
THE EDP WANTS THE TTS DONE/.  THE PT IS  ASLEEP  AGAIN.  MACHINE AT THE BEDSIDE.  SITTER AT BEDSIDE

## 2014-05-19 NOTE — [facility] Suicide Risk Assessment (Signed)
[facility] Discharge Suicide Risk Assessment   Demographic Factors:  Male and Adolescent or young adult  Total Time spent with patient: 30 minutes  Musculoskeletal: Strength & Muscle Tone: within normal limits Gait & Station: normal Patient leans: N/A  Psychiatric Specialty Exam: Physical Exam  ROS  Blood pressure 145/93, pulse 91, temperature 98.7 F (37.1 C), temperature source Oral, resp. rate 16, height  (1.803 m), weight 91.627 kg (202 lb), SpO2 98 %.Body mass index is 28.19 kg/(m^2).  General Appearance: Casual, Fairly Groomed and Neat  Eye Contact::  Fair  Speech:  Clear and Coherent and Normal Rate409  Volume:  Normal  Mood:  Euthymic  Affect:  Appropriate, Congruent and Full Range  Thought Process:  Coherent, Goal Directed, Linear and Logical  Orientation:  Full (Time, Place, and Person)  Thought Content:  Negative  Suicidal Thoughts:  No  Homicidal Thoughts:  No  Memory:  Negative  Judgement:  Fair  Insight:  Fair  Psychomotor Activity:  Normal  Concentration:  Fair  Recall:  Fair  Fund of Knowledge:Fair  Language: Fair  Akathisia:  Negative  Handed:  Right  AIMS (if indicated):     Assets:  Communication Skills Desire for Improvement Housing Physical Health Resilience Social Support Talents/Skills  Sleep:  Number of Hours: 6.5  Cognition: WNL  ADL's:  Intact   Have you used any form of tobacco in the last 30 days? (Cigarettes, Smokeless Tobacco, Cigars, and/or Pipes): Yes  Has this patient used any form of tobacco in the last 30 days? (Cigarettes, Smokeless Tobacco, Cigars, and/or Pipes) Yes, A prescription for an FDA-approved tobacco cessation medication was offered at discharge and the patient refused  Mental Status Per Nursing Assessment::   On Admission:  Suicide plan, Self-harm thoughts  Current Mental Status by Physician: Pt reports feeling better today. No withdrawal symptoms reported. Sleep/appetite good. Pt interacting well with staff and  peers. Pt denies SI/HI/AVH. Pt reports readiness for discharge, because he wants to start the [facility] program tomorrow. Pt does not recall what he said when he presented to the ED, because he was intoxicated. Pt denies any history of suicide attempts.  Loss Factors: NA  Historical Factors: NA  Risk Reduction Factors:   Employed, Living with another person, especially a relative, Positive social support, Positive therapeutic relationship and Positive coping skills or problem solving skills  Continued Clinical Symptoms:  Alcohol/Substance Abuse/Dependencies  Cognitive Features That Contribute To Risk:  Thought constriction (tunnel vision)    Suicide Risk:  Minimal: No identifiable suicidal ideation.  Patients presenting with no risk factors but with morbid ruminations; may be classified as minimal risk based on the severity of the depressive symptoms  Principal Problem: Alcohol dependence with alcohol-induced mood disorder Discharge Diagnoses:  Patient Active Problem List   Diagnosis Date Noted  . Polysubstance abuse [F19.10] 05/18/2014  . Alcohol dependence with alcohol-induced mood disorder [F10.24] 05/18/2014  . Alcoholic ketoacidosis [E87.2] 27/25/3664  . Abdominal pain [R10.9] 05/02/2013  . MDD (major depressive disorder) [F32.2] 04/11/2013  . Alcohol intoxication in active alcoholic [F10.229] 02/24/2013  . Alcohol dependence [F10.20] 06/01/2012  . Cocaine abuse [F14.10] 06/01/2012  . Substance induced mood disorder [F19.94] 06/01/2012      Plan Of Care/Follow-up recommendations:  Activity:  as tolerated Diet:  regular Tests:  per PCP Other:  f/u with [facility] program tomorrow.   Is patient on multiple antipsychotic therapies at discharge:  No   Has Patient had three or more failed trials of antipsychotic monotherapy by  history:  No  Recommended Plan for Multiple Antipsychotic Therapies: NA    Marie Borowski 05/19/2014, 12:38 PM

## 2014-05-19 NOTE — [facility] Group Notes (Signed)
[facility] Group Notes:  (Clinical Social Work)  05/19/2014  10:00-11:00AM  Summary of Progress/Problems:   The main focus of today's process group was to   1)  discuss the importance of adding supports  2)  define health supports versus unhealthy supports  3)  identify the patient's current unhealthy supports and plan how to handle them  4)  Identify the patient's current healthy supports and plan what to add.  An emphasis was placed on using counselor, doctor, therapy groups, 12-step groups, and problem-specific support groups to expand supports.    The patient expressed full comprehension of the concepts presented, and agreed that there is a need to add more supports.  The patient stated his healthy supports include his ex-girlfriend who is supporting him, will drive him to [facility] at D/C.  He states he has an appointment with his Probation Officer Monday morning at 9:30am, and needs to be discharged in time to get there.  He is supposed to go to [facility]'s 2-year program on Monday 05/20/14.  He was there previously, graduated in 2013, remained sober 6 months.  He is able to identify things that he can do differently this time.  Type of Therapy:  Process Group with Motivational Interviewing  Participation Level:  Active  Participation Quality:  Attentive and Sharing  Affect:  Anxious and Blunted  Cognitive:  Appropriate and Oriented  Insight:  Engaged  Engagement in Therapy:  Engaged  Modes of Intervention:   Education, Support and Processing, Activity  Pilgrim's PrideMareida Grossman-Orr, LCSW 05/19/2014, 12:15pm

## 2014-05-19 NOTE — Progress Notes (Signed)
  [facility] Discharge [facility] :  Will you be returning to the same living situation after discharge:  Yes,  homeless At discharge, do you have transportation home?: No. Do you have the ability to pay for your medications: No.  Release of information consent forms completed and in the chart;  Patient's signature needed at discharge.  Patient to Follow up at: Follow-up Information    Please follow up.   Why:  Follow up with your Parole Officer related to [facility]     05/20/2014      Patient denies SI/HI: Yes,  see MD Suicide Risk Assessment at Discharge note    Safety Planning and Suicide Prevention discussed: No.  Needed.  Have you used any form of tobacco in the last 30 days? (Cigarettes, Smokeless Tobacco, Cigars, and/or Pipes): Yes  Has patient been referred to the Quitline?: Pt was discharged before Quitline referral had been offered  Sarina SerGrossman-Orr, Graceyn Fodor Jo 05/19/2014, 3:48 PM

## 2014-05-19 NOTE — Progress Notes (Signed)
D:Affect is appropriate to mood. Pt currently denies depressed mood and anxiety and states readiness to be discharged to go [facility]. A:Support and encouragement offered. R:Receptive. No complaints of pain or problems at this time.

## 2014-05-19 NOTE — [facility] Suicide Risk Assessment (Addendum)
[facility] INPATIENT:  Family/Significant Other Suicide Prevention Education  Suicide Prevention Education:   Pt was discharged without CSW's knowledge.  He had not yet been provided with Suicide Prevention Education.  He had not yet been asked to sign consent for a family member or friend to be provided with Suicide Prevention Education.    CSW attempted today at 3:43pm to contact him on the "preferred" number provided on face sheet.  The person who answered stated he was not there.  CSW did not have written consent, did not provide any information for reason for call.  CSW attempted at 3:44pm to contact cell phone number given, and it was out of service.    CSW sent the pertinent SPE to pt in an email to the email address provided.  Sarina SerGrossman-Orr, Anadelia Kintz Jo 05/19/2014, 3:45 PM
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
## 2014-05-22 NOTE — Progress Notes (Signed)
Patient Discharge Instructions:  No documentation was faxed for [facility]IPS.  No follow up is present/scheduled on the AVS and no ROI is available.    Adam ReddenSheena [name] [place], 05/22/2014, 2:59 PM
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
## 2014-10-22 ENCOUNTER — Encounter (HOSPITAL_COMMUNITY): Payer: Self-pay | Admitting: *Deleted

## 2014-10-22 ENCOUNTER — Inpatient Hospital Stay (HOSPITAL_COMMUNITY)
Admission: AD | Admit: 2014-10-22 | Discharge: 2014-10-25 | DRG: 897 | Disposition: A | Discharge status: Home / Self Care | Payer: Federal, State, Local not specified - Other | Source: Intra-hospital | Attending: Psychiatry | Admitting: Psychiatry

## 2014-10-22 ENCOUNTER — Emergency Department (HOSPITAL_COMMUNITY)
Admission: EM | Admit: 2014-10-22 | Discharge: 2014-10-22 | Disposition: A | Discharge status: Other Acute Inpatient Hospital | Payer: Self-pay | Attending: Emergency Medicine | Admitting: Emergency Medicine

## 2014-10-22 DIAGNOSIS — Y903 Blood alcohol level of 60-79 mg/100 ml: Secondary | ICD-10-CM | POA: Diagnosis present

## 2014-10-22 DIAGNOSIS — F1024 Alcohol dependence with alcohol-induced mood disorder: Secondary | ICD-10-CM | POA: Diagnosis present

## 2014-10-22 DIAGNOSIS — F1410 Cocaine abuse, uncomplicated: Secondary | ICD-10-CM | POA: Insufficient documentation

## 2014-10-22 DIAGNOSIS — F17210 Nicotine dependence, cigarettes, uncomplicated: Secondary | ICD-10-CM | POA: Diagnosis present

## 2014-10-22 DIAGNOSIS — Z87828 Personal history of other (healed) physical injury and trauma: Secondary | ICD-10-CM | POA: Insufficient documentation

## 2014-10-22 DIAGNOSIS — F1020 Alcohol dependence, uncomplicated: Secondary | ICD-10-CM | POA: Diagnosis not present

## 2014-10-22 DIAGNOSIS — R45851 Suicidal ideations: Secondary | ICD-10-CM | POA: Diagnosis present

## 2014-10-22 DIAGNOSIS — F1420 Cocaine dependence, uncomplicated: Secondary | ICD-10-CM | POA: Diagnosis present

## 2014-10-22 DIAGNOSIS — F1490 Cocaine use, unspecified, uncomplicated: Secondary | ICD-10-CM | POA: Diagnosis not present

## 2014-10-22 DIAGNOSIS — F1994 Other psychoactive substance use, unspecified with psychoactive substance-induced mood disorder: Secondary | ICD-10-CM | POA: Diagnosis present

## 2014-10-22 DIAGNOSIS — F1310 Sedative, hypnotic or anxiolytic abuse, uncomplicated: Secondary | ICD-10-CM | POA: Insufficient documentation

## 2014-10-22 DIAGNOSIS — F1010 Alcohol abuse, uncomplicated: Secondary | ICD-10-CM | POA: Insufficient documentation

## 2014-10-22 DIAGNOSIS — G4700 Insomnia, unspecified: Secondary | ICD-10-CM | POA: Diagnosis present

## 2014-10-22 DIAGNOSIS — F329 Major depressive disorder, single episode, unspecified: Secondary | ICD-10-CM | POA: Diagnosis present

## 2014-10-22 DIAGNOSIS — Z8739 Personal history of other diseases of the musculoskeletal system and connective tissue: Secondary | ICD-10-CM | POA: Insufficient documentation

## 2014-10-22 DIAGNOSIS — F1099 Alcohol use, unspecified with unspecified alcohol-induced disorder: Secondary | ICD-10-CM | POA: Diagnosis not present

## 2014-10-22 DIAGNOSIS — Z720 Tobacco use: Secondary | ICD-10-CM | POA: Insufficient documentation

## 2014-10-22 DIAGNOSIS — F1910 Other psychoactive substance abuse, uncomplicated: Secondary | ICD-10-CM

## 2014-10-22 LAB — RAPID URINE DRUG SCREEN, HOSP PERFORMED
AMPHETAMINES: NOT DETECTED
Barbiturates: NOT DETECTED
Benzodiazepines: POSITIVE — AB
COCAINE: POSITIVE — AB
OPIATES: NOT DETECTED
TETRAHYDROCANNABINOL: NOT DETECTED

## 2014-10-22 LAB — CBC
HCT: 39.6 % (ref 39.0–52.0)
Hemoglobin: 13.7 g/dL (ref 13.0–17.0)
MCH: 29.3 pg (ref 26.0–34.0)
MCHC: 34.6 g/dL (ref 30.0–36.0)
MCV: 84.8 fL (ref 78.0–100.0)
PLATELETS: 200 10*3/uL (ref 150–400)
RBC: 4.67 MIL/uL (ref 4.22–5.81)
RDW: 15 % (ref 11.5–15.5)
WBC: 9.1 10*3/uL (ref 4.0–10.5)

## 2014-10-22 LAB — COMPREHENSIVE METABOLIC PANEL
ALK PHOS: 56 U/L (ref 38–126)
ALT: 41 U/L (ref 17–63)
ANION GAP: 11 (ref 5–15)
AST: 36 U/L (ref 15–41)
Albumin: 3.4 g/dL — ABNORMAL LOW (ref 3.5–5.0)
BUN: 6 mg/dL (ref 6–20)
CO2: 24 mmol/L (ref 22–32)
Calcium: 8.5 mg/dL — ABNORMAL LOW (ref 8.9–10.3)
Chloride: 102 mmol/L (ref 101–111)
Creatinine, Ser: 0.77 mg/dL (ref 0.61–1.24)
GFR calc Af Amer: >60 mL/min (ref >60)
GFR calc non Af Amer: >60 mL/min (ref >60)
GLUCOSE: 84 mg/dL (ref 65–99)
Potassium: 3.5 mmol/L (ref 3.5–5.1)
SODIUM: 137 mmol/L (ref 135–145)
TOTAL PROTEIN: 6.6 g/dL (ref 6.5–8.1)
Total Bilirubin: 0.4 mg/dL (ref 0.3–1.2)

## 2014-10-22 LAB — SALICYLATE LEVEL

## 2014-10-22 LAB — ACETAMINOPHEN LEVEL: Acetaminophen (Tylenol), Serum: <10 ug/mL — ABNORMAL LOW (ref 10–30)

## 2014-10-22 LAB — ETHANOL: Alcohol, Ethyl (B): 75 mg/dL — ABNORMAL HIGH (ref <5)

## 2014-10-22 MED ORDER — ZOLPIDEM TARTRATE 5 MG PO TABS: 5.0000 mg | ORAL_TABLET | Freq: Every evening | ORAL | Status: DC | PRN

## 2014-10-22 MED ORDER — CHLORDIAZEPOXIDE HCL 25 MG PO CAPS: 25.0000 mg | ORAL_CAPSULE | Freq: Three times a day (TID) | ORAL | Status: AC

## 2014-10-22 MED ORDER — ONDANSETRON 4 MG PO TBDP: 4.0000 mg | ORAL_TABLET | Freq: Four times a day (QID) | ORAL | Status: DC | PRN

## 2014-10-22 MED ORDER — ONDANSETRON HCL 4 MG PO TABS: 4.0000 mg | ORAL_TABLET | Freq: Three times a day (TID) | ORAL | Status: DC | PRN

## 2014-10-22 MED ORDER — NICOTINE 21 MG/24HR TD PT24
21.0000 mg | MEDICATED_PATCH | Freq: Every day | TRANSDERMAL | Status: DC
  Filled 2014-10-22 (×3): qty 1

## 2014-10-22 MED ORDER — ACETAMINOPHEN 325 MG PO TABS: 650.0000 mg | ORAL_TABLET | ORAL | Status: DC | PRN

## 2014-10-22 MED ORDER — THIAMINE HCL 100 MG/ML IJ SOLN
100.0000 mg | Freq: Once | INTRAMUSCULAR | Status: AC
  Administered 2014-10-22: 100 mg via INTRAMUSCULAR
  Filled 2014-10-22: qty 2

## 2014-10-22 MED ORDER — CHLORDIAZEPOXIDE HCL 25 MG PO CAPS: 25.0000 mg | ORAL_CAPSULE | Freq: Four times a day (QID) | ORAL | Status: DC | PRN

## 2014-10-22 MED ORDER — LORAZEPAM 1 MG PO TABS: 1.0000 mg | ORAL_TABLET | Freq: Three times a day (TID) | ORAL | Status: DC | PRN

## 2014-10-22 MED ORDER — ACETAMINOPHEN 325 MG PO TABS: 650.0000 mg | ORAL_TABLET | Freq: Four times a day (QID) | ORAL | Status: DC | PRN

## 2014-10-22 MED ORDER — NICOTINE 14 MG/24HR TD PT24
14.0000 mg | MEDICATED_PATCH | Freq: Every day | TRANSDERMAL | Status: DC
Start: 2014-10-22 — End: 2014-10-22
  Filled 2014-10-22: qty 1

## 2014-10-22 MED ORDER — CHLORDIAZEPOXIDE HCL 25 MG PO CAPS
25.0000 mg | ORAL_CAPSULE | Freq: Four times a day (QID) | ORAL | Status: AC
  Administered 2014-10-22 – 2014-10-23 (×5): 25 mg via ORAL
  Filled 2014-10-22 (×6): qty 1

## 2014-10-22 MED ORDER — IBUPROFEN 400 MG PO TABS: 600.0000 mg | ORAL_TABLET | Freq: Three times a day (TID) | ORAL | Status: DC | PRN

## 2014-10-22 MED ORDER — HYDROXYZINE HCL 25 MG PO TABS
25.0000 mg | ORAL_TABLET | Freq: Four times a day (QID) | ORAL | Status: DC | PRN
  Administered 2014-10-24: 25 mg via ORAL
  Filled 2014-10-22: qty 1

## 2014-10-22 MED ORDER — CHLORDIAZEPOXIDE HCL 25 MG PO CAPS: 25.0000 mg | ORAL_CAPSULE | Freq: Every day | ORAL | Status: DC

## 2014-10-22 MED ORDER — ADULT MULTIVITAMIN W/MINERALS CH
1.0000 | ORAL_TABLET | Freq: Every day | ORAL | Status: DC
  Administered 2014-10-22 – 2014-10-25 (×4): 1 via ORAL
  Filled 2014-10-22 (×8): qty 1

## 2014-10-22 MED ORDER — VITAMIN B-1 100 MG PO TABS
100.0000 mg | ORAL_TABLET | Freq: Every day | ORAL | Status: DC
  Administered 2014-10-23 – 2014-10-25 (×3): 100 mg via ORAL
  Filled 2014-10-22 (×6): qty 1

## 2014-10-22 MED ORDER — LOPERAMIDE HCL 2 MG PO CAPS
2.0000 mg | ORAL_CAPSULE | ORAL | Status: DC | PRN
Start: 2014-10-22 — End: 2014-10-25

## 2014-10-22 MED ORDER — CHLORDIAZEPOXIDE HCL 25 MG PO CAPS: 25.0000 mg | ORAL_CAPSULE | ORAL | Status: DC

## 2014-10-22 MED ORDER — ALUM & MAG HYDROXIDE-SIMETH 200-200-20 MG/5ML PO SUSP: 30.0000 mL | ORAL | Status: DC | PRN

## 2014-10-22 MED ORDER — MAGNESIUM HYDROXIDE 400 MG/5ML PO SUSP: 30.0000 mL | Freq: Every day | ORAL | Status: DC | PRN

## 2014-10-22 MED ORDER — TRAZODONE HCL 50 MG PO TABS
50.0000 mg | ORAL_TABLET | Freq: Every evening | ORAL | Status: DC | PRN
Start: 2014-10-22 — End: 2014-10-25
  Administered 2014-10-23 – 2014-10-24 (×2): 50 mg via ORAL
  Filled 2014-10-22 (×2): qty 1
  Filled 2014-10-22: qty 14

## 2014-10-22 NOTE — ED Notes (Signed)
Spoke to [place] from [facility]; pt meets inpt criteria; possible admission to [facility] after 11am

## 2014-10-22 NOTE — BH Assessment (Addendum)
Reviewed ED notes prior to initiating assessment. Per notes pt present to ED requesting detox from etoh and cocaine, and reporting SI with plan to shoot himself.   Requested cart be placed with pt for assessment.   Assessment to commence shortly.   [place] RN requests five minutes to place cart. Assessment to begin at 0555.   Clista Bernhardt, [facility] Triage Specialist 10/22/2014 5:47 AM

## 2014-10-22 NOTE — ED Notes (Signed)
Per Marylu Lund, RN, [facility]  - OK send pt now.
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
## 2014-10-22 NOTE — ED Notes (Signed)
Pt black watch removed and placed with security in safe

## 2014-10-22 NOTE — BH Assessment (Signed)
Potential Placement at [facility] later in AM. Sent referrals as well. Sent referrals to:   Dom[place]a [place], [employer], [facility] [place] Triage Specialist 10/22/2014 6:27 AM

## 2014-10-22 NOTE — ED Notes (Signed)
Pt c/o suicidal ideation with plan and detox . Reports using $300 of crack prior to arrival and drinking a mixture of beer and liquor. Pt was recently seen for detox; has been out of program 3-4 days. Pt reports plan to shoot self
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
## 2014-10-22 NOTE — ED Notes (Signed)
TTS in process 

## 2014-10-22 NOTE — Discharge Instructions (Signed)
Transfer to [facility] 

## 2014-10-22 NOTE — Tx Team (Addendum)
Initial Interdisciplinary Treatment Plan   PATIENT STRESSORS: Financial difficulties Marital or family conflict Substance abuse   PATIENT STRENGTHS: Ability for insight Barrister's clerk for treatment/growth   PROBLEM LIST: Problem List/Patient Goals Date to be addressed Date deferred Reason deferred Estimated date of resolution  Pt stated,"I don't work and don't have any money."      "My lady friend just kicked me out so I have no place to go."      "I have been on crack for 7 years and alcohol for 11 years. I need help."      Polysubstance abuse      Depression      Potential for self-harm      Anxiety                   DISCHARGE CRITERIA:  Ability to meet basic life and health needs Motivation to continue treatment in a less acute level of care Safe-care adequate arrangements made  PRELIMINARY DISCHARGE PLAN: Attend aftercare/continuing care group Outpatient therapy Placement in alternative living arrangements  PATIENT/FAMIILY INVOLVEMENT: This treatment plan has been presented to and reviewed with the patient, Adam [name], The patient  has been given the opportunity to ask questions and make suggestions.  Rodman Key [facility] 10/22/2014, 2:30 PM

## 2014-10-22 NOTE — ED Notes (Signed)
Security wanded pt. No other items found on pt - other than scouring pad pt had given to RN - thrown in trash - per pt request - security aware.

## 2014-10-22 NOTE — ED Notes (Signed)
Copy of consent forms faxed to [facility]. 

## 2014-10-22 NOTE — Progress Notes (Addendum)
Per Julieanne Cotton, pt accepted to [facility] bed 306-1by Dr. Dub Mikes. Bed will be ready at 12pm today. Admission is voluntary.   Spoke with [facility] RN re: pt's placement  Ilean Skill, MSW, LCSW Clinical Social Work, Disposition  10/22/2014 [phone]

## 2014-10-22 NOTE — ED Provider Notes (Signed)
Patient resting quietly, no distress.  Filed Vitals:   10/22/14 0453  BP: 125/83  Pulse: 85  Temp: 97.7 F (36.5 C)  Resp: 16   Pt accepted to [facility], Dr Dub Mikes, bed ready.  Pt currently appears stable for transfer.     Cathren Laine, MD 10/22/14 1113

## 2014-10-22 NOTE — Progress Notes (Signed)
Pt did not attend the evening AA speaker meeting. Pt was in bed resting. 
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
## 2014-10-22 NOTE — Progress Notes (Signed)
Recreation Therapy Notes  Animal-Assisted Activity (AAA) Program Checklist/Progress Notes Patient Eligibility Criteria Checklist & Daily Group note for Rec Tx Intervention  Date: 08.09.16 Time: 2:45 pm Location: 400 Hall Dayroom  AAA/T Program Assumption of Risk Form signed by Patient/ or Parent Legal Guardian yes  Patient is free of allergies or sever asthma yes  Patient reports no fear of animals yes  Patient reports no history of cruelty to animalsyes  Patient understands his/her participation is voluntary yes  Patient washes hands before animal contact yes  Patient washes hands after animal contact yes  Education: Hand Washing, Appropriate Animal Interaction   Education Outcome: Acknowledges understanding/In group clarification offered/Needs additional education.   Clinical Observations/Feedback: Patient did not attend group.   Jonavon Trieu, LRT/CTRS         Yamel Bale A 10/22/2014 4:30 PM 

## 2014-10-22 NOTE — ED Notes (Signed)
Resting quietly on bed w/eyes closed. Respirations even, unlabored. Pt woke to voice - voiced agreement w/tx plan and signed consent forms.

## 2014-10-23 ENCOUNTER — Encounter (HOSPITAL_COMMUNITY): Payer: Self-pay | Admitting: Psychiatry

## 2014-10-23 DIAGNOSIS — F1490 Cocaine use, unspecified, uncomplicated: Secondary | ICD-10-CM

## 2014-10-23 DIAGNOSIS — F1099 Alcohol use, unspecified with unspecified alcohol-induced disorder: Secondary | ICD-10-CM

## 2014-10-23 DIAGNOSIS — F1994 Other psychoactive substance use, unspecified with psychoactive substance-induced mood disorder: Secondary | ICD-10-CM
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
## 2014-10-23 NOTE — BHH Group Notes (Signed)
[facility] LCSW Aftercare Discharge Planning Group Note   10/23/2014 11:13 AM  Participation Quality:  Appropriate   Mood/Affect:  Appropriate  Depression Rating:  0  Anxiety Rating:  0  Thoughts of Suicide:  No Will you contract for safety?   NA  Current AVH:  No  Plan for Discharge/Comments:  Pt reports that he recently got out of jail after 71mo and immediately relapsed on drugs and alcohol. He is starting new job and is interested in o/p services only-[facility]. No withdrawal sx noted. Plans to return home with ex girlfriend.   Transportation Means: ex gf?   Supports: ex Banker, Oncologist

## 2014-10-23 NOTE — Progress Notes (Signed)
Pt attended NA speaker meeting and was engaged and attentive.  

## 2014-10-23 NOTE — BHH Suicide Risk Assessment (Signed)
[facility] Admission Suicide Risk Assessment   Nursing information obtained from:  Patient Demographic factors:  Male, Low socioeconomic status Current Mental Status:  Suicide plan Loss Factors:  Loss of significant relationship, Financial problems / change in socioeconomic status Historical Factors:    Risk Reduction Factors:  Religious beliefs about death Total Time spent with patient: 45 minutes Principal Problem: Substance induced mood disorder Diagnosis:   Patient Active Problem List   Diagnosis Date Noted  . Alcohol dependence [F10.20] 06/01/2012    Priority: High  . Cocaine abuse [F14.10] 06/01/2012    Priority: High  . Substance induced mood disorder [F19.94] 06/01/2012    Priority: High  . Alcohol use disorder, severe, dependence [F10.20] 10/22/2014  . Polysubstance abuse [F19.10] 05/18/2014  . Alcohol dependence with alcohol-induced mood disorder [F10.24] 05/18/2014  . Alcoholic ketoacidosis [E87.2] 16/12/9602  . Abdominal pain [R10.9] 05/02/2013  . MDD (major depressive disorder) [F32.2] 04/11/2013  . Alcohol intoxication in active alcoholic [F10.229] 02/24/2013     Continued Clinical Symptoms:  Alcohol Use Disorder Identification Test Final Score (AUDIT): 24 The "Alcohol Use Disorders Identification Test", Guidelines for Use in Primary Care, Second Edition.  World Science writer [facility]). Score between 0-7:  no or low risk or alcohol related problems. Score between 8-15:  moderate risk of alcohol related problems. Score between 16-19:  high risk of alcohol related problems. Score 20 or above:  warrants further diagnostic evaluation for alcohol dependence and treatment.   CLINICAL FACTORS:   Depression:   Comorbid alcohol abuse/dependence Alcohol/Substance Abuse/Dependencies  Psychiatric Specialty Exam: Physical Exam  ROS  Blood pressure 131/65, pulse 63, temperature 98.8 F (37.1 C), temperature source Oral, resp. rate 16, height 5' 9.75" (1.772 m), weight 94.802  kg (209 lb).Body mass index is 30.19 kg/(m^2).   COGNITIVE FEATURES THAT CONTRIBUTE TO RISK:  Closed-mindedness, Polarized thinking and Thought constriction (tunnel vision)    SUICIDE RISK:   Mild:  Suicidal ideation of limited frequency, intensity, duration, and specificity.  There are no identifiable plans, no associated intent, mild dysphoria and related symptoms, good self-control (both objective and subjective assessment), few other risk factors, and identifiable protective factors, including available and accessible social support.  PLAN OF CARE: Supportive approach/coping skills                               Alcohol/cocaine dependence; [facility]rium detox protocol                               Work a relapse prevention plan                               Reassess and address the comorbidities  Medical Decision Making:  Review of Psycho-Social Stressors (1), Review or order clinical lab tests (1), Review of Medication Regimen & Side Effects (2) and Review of New Medication or Change in Dosage (2)  I certify that inpatient services furnished can reasonably be expected to improve the patient's condition.   Little Winton A 10/23/2014, 5:25 PM

## 2014-10-23 NOTE — Plan of Care (Signed)
Problem: Alteration in mood & ability to function due to Goal: STG-Patient will report withdrawal symptoms Outcome: Progressing Patient able to report withdrawal symptoms.
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
## 2014-10-23 NOTE — Progress Notes (Signed)
Patient ID: Adam [name], male   DOB: 1986-01-31, 31 y.o.   MRN: [id number]  DAR: Pt. Denies SI/HI and A/V Hallucinations. He continues to deny any withdrawal symptoms. He reports sleep was good last night, appetite is good, energy level is normal, and concentration level is good. He rates her depression, hopelessness, and anxiety level is 0/10. Patient does not report any pain or discomfort at this time. Support and encouragement provided to the patient. Scheduled medications administered to patient per physician's orders. Patient is receptive and cooperative however minimal and forwards little. Q15 minute checks are maintained for safety.

## 2014-10-23 NOTE — Progress Notes (Signed)
D: Pt with flat affects; alert and oriented x4 is verbalizing severe depression. He states "my mom has been in hospice care for 2 years now; rat poison was added to her cocaine; my dad leg was amputated about 4 months ago; I am just very depressed; I can't take this anymore." Pt however denies anxiety. Pt also denies SI/HI/AVH. Pt verbalizes his determination to get better for his parents. Pt has mostly be isolative. A: Medications administered as prescribed.  Support, encouragement, and safe environment provided. 15-minute safety checks continue. R: Pt was med compliant.  Did not attend AA group. Safety checks continue.

## 2014-10-23 NOTE — Progress Notes (Signed)
Recreation Therapy Notes  Date: 08.10.16 Time: 930 am Location: 300 Hall Group Room  Group Topic: Stress Management  Goal Area(s) Addresses:  Patient will verbalize importance of using healthy stress management.  Patient will identify positive emotions associated with healthy stress management.   Intervention: Stress Management  Activity : Progressive Muscle Relaxation. LRT will introduce and instruct patients on the stress management technique of progressive muscle relaxation. Patients were asked to follow a long with a script read a loud by LRT to participate in the stress management technique of progressive muscle relaxation.  Education: Stress Management, Discharge Planning.   Education Outcome: Acknowledges edcuation/In group clarification offered/Needs additional education  Clinical Observations/Feedback: Patient did not attend group.    Caroll Rancher, LRT/CTRS         Caroll Rancher A 10/23/2014 3:52 PM

## 2014-10-24 MED ORDER — PNEUMOCOCCAL VAC POLYVALENT 25 MCG/0.5ML IJ INJ
0.5000 mL | INJECTION | INTRAMUSCULAR | Status: DC
Start: 2014-10-25 — End: 2014-10-25

## 2014-10-24 NOTE — Progress Notes (Signed)
D Pt. Denies SI and HI, no complaints of pain or discomfort noted at present time.  A Writer offered support and encouragement, discussed discharge plans with pt.  R Pt.  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  Pt. Also reports it is going to be different this time, that he is going to be around more positive people and form a new group of friends.  Pt. Reports both of his parents are in hospice care, stating his Mother was due to ingesting rat poison in cocaine causing a CVA many years ago and his Father only has had both legs amputated and only has months to live.  Pt. Remains safe on the unit.
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
## 2014-10-24 NOTE — Progress Notes (Signed)
Patient attended karaoke group and participated.  

## 2014-10-24 NOTE — BHH Group Notes (Signed)
LATE ENTRY FROM 8/10:      BHH LCSW Group Therapy 10/23/14  1:15 PM   Type of Therapy: Group Therapy  Participation Level: Did Not Attend. Patient invited to participate but declined.   Samuella Bruin, MSW, [employer] Clinical Social Worker [facility] [phone]

## 2014-10-24 NOTE — BHH Suicide Risk Assessment (Signed)
B[facility] INPATIENT:  Family/Significant Other Suicide Prevention Education  Suicide Prevention Education:  Education Completed; ex-girlfriend [name] [phone] ,  (name of family member/significant other) has been identified by the patient as the family member/significant other with whom the patient will be residing, and identified as the person(s) who will aid the patient in the event of a mental health crisis (suicidal ideations/suicide attempt).  With written consent from the patient, the family member/significant other has been provided the following suicide prevention education, prior to the and/or following the discharge of the patient.  The suicide prevention education provided includes the following:  Suicide risk factors  Suicide prevention and interventions  National Suicide Hotline telephone number  [facility] assessment telephone number  [facility] Emergency Assistance 911  [facility] and/or Residential Mobile Crisis Unit telephone number  Request made of family/significant other to:  Remove weapons (e.g., guns, rifles, knives), all items previously/currently identified as safety concern.    Remove drugs/medications (over-the-counter, prescriptions, illicit drugs), all items previously/currently identified as a safety concern.  The family member/significant other verbalizes understanding of the suicide prevention education information provided.  The family member/significant other agrees to remove the items of safety concern listed above.  Adam [name], [place] 10/24/2014, 8:39 AM

## 2014-10-24 NOTE — BHH Group Notes (Signed)
B[facility] LCSW Group Therapy 10/24/2014 1:15 PM Type of Therapy: Group Therapy Participation Level: Active  Participation Quality: Attentive, Sharing and Supportive  Affect: Appropriate  Cognitive: Alert and Oriented  Insight: Developing/Improving and Engaged  Engagement in Therapy: Developing/Improving and Engaged  Modes of Intervention: Activity, Clarification, Confrontation, Discussion, Education, Exploration, Limit-setting, Orientation, Problem-solving, Rapport Building, Reality Testing, Socialization and Support  Summary of Progress/Problems: Patient was attentive and engaged with speaker from [employer]. Patient was attentive to speaker while they shared their story of dealing with mental health and overcoming it. Patient expressed interest in their programs and services and received information on their agency. Patient processed ways they can relate to the speaker.   Axten Pascucci, MSW, LCSWA Clinical Social Worker [facility] [phone]   

## 2014-10-24 NOTE — Progress Notes (Signed)
D Therman has refused his librium all day...saying he " didn't need it". He thought he was going to be dc'd today, but that was not the case.   A He completed his daily assessment and on it he wrote he deneid SI and he rated his depression, hopelessness and anxiety " 0 /0/0/", respectively.   R Safety is in place and poc cont. Poss dc tomorrow.

## 2014-10-24 NOTE — Progress Notes (Signed)
D: Pt who has been very reclusive denies any form of depression, anxiety, pain, SI, HI and AVH. He states, "I feel wonderful; I think I am ready to go; I have a job waiting for me at home." Pt became irritable after he was encouraged by the nurse to attend the NA meeting. He states, "I been through a thousand of this (The NA meeting)" Pt continues to be nonviolent. A: Medications administered as prescribed.  Pt attend the NA group meeting. Support, encouragement, and safe environment provided. 15-minute safety checks continue. R: Pt was med compliant.  Safety checks continue.

## 2014-10-25 DIAGNOSIS — F1020 Alcohol dependence, uncomplicated: Secondary | ICD-10-CM

## 2014-10-25 MED ORDER — TRAZODONE HCL 50 MG PO TABS: 50.0000 mg | ORAL_TABLET | Freq: Every evening | ORAL | Status: DC | PRN

## 2014-10-25 NOTE — Tx Team (Signed)
Interdisciplinary Treatment Plan Update (Adult) Date: 10/25/2014   Time Reviewed: 9:30 AM  Progress in Treatment: Attending groups: Minimally Participating in groups: Minimally Taking medication as prescribed: Yes Tolerating medication: Yes Family/Significant other contact made: Yes, CSW has spoken with patient's ex-girlfriend Patient understands diagnosis: Yes Discussing patient identified problems/goals with staff: Yes Medical problems stabilized or resolved: Yes Denies suicidal/homicidal ideation: Yes Issues/concerns per patient self-inventory: Yes Other:  New problem(s) identified: N/A  Discharge Plan or Barriers:   10/25/2014: Patient plans to discharge home with girlfriend to follow up with [facility].  Reason for Continuation of Hospitalization:  Depression Anxiety Medication Stabilization   Comments: N/A  Estimated length of stay: Discharge anticipated for today 10/25/14.   Pt is 29 year old male admitted for SI and ETOH/crack detox. Patient has been homeless for 1 week after being kicked out of his ex-girlfriend's house but is hoping to return there at discharge. Patient agreeable to follow up with [facility] for outpatient services. Recommendations for pt include: crisis stabilization, therapeutic milieu, encourage group attendance and participation, librium taper for withdrawals, medication management for mood stabilization, and development of comprehensive mental wellness/sobriety plan. Pt is refusing referral to long term i/p treatment facility but would like to follow up at [facility] for med management and assessment for therapy services (Care coordination).    Review of initial/current patient goals per problem list:  1. Goal(s): Patient will participate in aftercare plan   Met: Yes   Target date: 3-5 days post admission date   As evidenced by: Patient will participate within aftercare plan AEB aftercare provider and housing plan at discharge being  identified.  10/25/2014: Patient plans to discharge home with girlfriend to follow up with [facility].   2. Goal (s): Patient will exhibit decreased depressive symptoms and suicidal ideations.   Met: Adequate for discharge   Target date: 3-5 days post admission date   As evidenced by: Patient will utilize self rating of depression at 3 or below and demonstrate decreased signs of depression or be deemed stable for discharge by MD.  10/25/2014: Patient reports feeling ready for discharge and denies SI.    4. Goal(s): Patient will demonstrate decreased signs of withdrawal due to substance abuse   Met: Yes   Target date: 3-5 days post admission date   As evidenced by: Patient will produce a CIWA/COWS score of 0, have stable vitals signs, and no symptoms of withdrawal  10/25/2014: Goal met: No withdrawal symptoms reported at this time per medical chart.    Attendees: Patient:    Family:    Physician: Dr. Parke Poisson; Dr. Sabra Heck 10/25/2014 9:30 AM  Nursing: Mayra Neer, Vira Browns, Desma Paganini, RN 10/25/2014 9:30 AM  Clinical Social Worker: Tilden Fossa,  [place] 10/25/2014 9:30 AM  Other: Peri Maris, Heather Smart, LCSWA 10/25/2014 9:30 AM  Other: Lucinda Dell, Beverly Sessions Liaison 10/25/2014 9:30 AM  Other:  10/25/2014 9:30 AM  Other: Ave Filter, NP 10/25/2014 9:30 AM  Other:    Other:    Other:      Scribe for Treatment Team:  Tilden Fossa, MSW, [place] [phone]

## 2014-10-25 NOTE — Progress Notes (Signed)
  [facility] Adult Case Management Discharge Plan :  Will you be returning to the same living situation after discharge:  No. Patient plans to stay with his ex-girlfriend at discharge At discharge, do you have transportation home?: Yes,  patient requesting bus pass Do you have the ability to pay for your medications: Yes,  patient will be provided with medication samples and prescriptions at discharge  Release of information consent forms completed and in the chart;  Patient's signature needed at discharge.  Patient to Follow up at: Follow-up Information    Follow up with [facility].   Specialty:  Behavioral Health   Why:  Walk-in clinic Monday-Friday between 8 am to 3pm to get set up with therapy and medication management services. Please arrive early in the morning in order to be seen.   Contact information:   [address] [place] [address] [phone]       Patient denies SI/HI: Yes,  denies    Safety Planning and Suicide Prevention discussed: Yes,  with patient and ex-girlfriend  Have you used any form of tobacco in the last 30 days? (Cigarettes, Smokeless Tobacco, Cigars, and/or Pipes): Yes  Has patient been referred to the Quitline?: Patient refused referral  Lillieann Pavlich, [place] 10/25/2014, 9:41 AM

## 2014-10-25 NOTE — Progress Notes (Signed)
Patient ID: Adam [name], male   DOB: 25-May-1985, 29 y.o.   MRN: [id number]  D: Patient at baseline of functioning. No withdrawals or SI. A: Obtained all belongings, prescriptions, sample meds, and follow-up appointment R: Given bus passes a this time

## 2014-10-25 NOTE — BHH Group Notes (Signed)
Adult Psychoeducational Group Note  Date:  10/25/2014 Time:  10:23 AM  Group Topic/Focus:   Date:  10/25/2014 Time:  10:25 AM  Group Topic/Focus:  Self Care:   The focus of this group is to help patients understand the importance of self-care in order to improve or restore emotional, physical, spiritual, interpersonal, and financial health.  Participation Level:  Active  Participation Quality:  Appropriate  Affect:  Appropriate  Cognitive:  Appropriate  Insight: Good  Engagement in Group:  Engaged  Modes of Intervention:  Activity  Additional Comments: MHT asked Adam [name] to state on positive thing about himself he stated that he has a great smile. Pt is excited about being discharged today and he has no thoughts of SI/HI.  Berlin Hun A 10/25/2014, 10:25 AM

## 2014-10-25 NOTE — [facility] Group Notes (Signed)
   [facility] LCSW Aftercare Discharge Planning Group Note  10/25/2014  8:45 AM   Participation Quality: Alert, Appropriate and Oriented  Mood/Affect: Appropriate  Depression Rating: 0  Anxiety Rating: 0  Thoughts of Suicide: Pt denies SI/HI  Will you contract for safety? Yes  Current AVH: Pt denies  Plan for Discharge/Comments: Pt attended discharge planning group and actively participated in group. CSW provided pt with today's workbook. Patient plans to stay with his girlfriend at discharge and follow up with [facility].  Transportation Means: Pt reports access to transportation  Supports: No supports mentioned at this time  Samuella Bruin, MSW, [employer] Clinical Social Worker [employer] [phone]

## 2014-10-25 NOTE — BHH Group Notes (Signed)
[facility] Group Notes:  (Nursing/MHT/Case Management/Adjunct)  Date:   10/24/2014  LATE ENTRY Time:   0900   Type of Therapy:  Nurse Education : The group was focused on helping patients identify unhealthy behaviors.   Participation Level:  Minimal  Participation Quality:  Resistant  Affect:  Resistant  Cognitive:  Oriented  Insight:  Limited  Engagement in Group:  Off Topic  Modes of Intervention:  Discussion  Summary of Progress/Problems:  Adam [name] 10/25/2014, 10:04 AM

## 2014-10-25 NOTE — Progress Notes (Signed)
Recreation Therapy Notes  Date: 08.12.16 Time: 9:30 am Location: [address] Group Room  Group Topic: Stress Management  Goal Area(s) Addresses:  Patient will verbalize importance of using healthy stress management.  Patient will identify positive emotions associated with healthy stress management.   Intervention: Stress Management  Activity :  Guided Imagery Script.  LRT will introduce and educate the patients on the stress management technique of guided imagery.  A script was used to deliver the technique to the patients.  Patients were asked to follow the script read aloud by the LRT to engage in the stress management technique.  Education:  Stress Management, Discharge Planning.   Education Outcome: Acknowledges edcuation/In group clarification offered/Needs additional education  Clinical Observations/Feedback: Patient did not attend group.   Adam [name], LRT/CTRS         Lucelia Lacey A 10/25/2014 4:04 PM 

## 2014-10-25 NOTE — BHH Suicide Risk Assessment (Signed)
[facility] Discharge Suicide Risk Assessment   Demographic Factors:  Male  Total Time spent with patient: 30 minutes  Musculoskeletal: Strength & Muscle Tone: within normal limits Gait & Station: normal Patient leans: N/A  Psychiatric Specialty Exam: Physical Exam  ROS  Blood pressure 132/89, pulse 83, temperature 97.7 F (36.5 C), temperature source Oral, resp. rate 16, height 5' 9.75" (1.772 m), weight 94.802 kg (209 lb).Body mass index is 30.19 kg/(m^2).  General Appearance: Fairly Groomed  Patent attorney::  Fair  Speech:  Clear and Coherent409  Volume:  Normal  Mood:  Euthymic  Affect:  Appropriate  Thought Process:  Coherent and Goal Directed  Orientation:  Full (Time, Place, and Person)  Thought Content:  plans as he moves on, relapse prevention plan  Suicidal Thoughts:  No  Homicidal Thoughts:  No  Memory:  Immediate;   Fair Recent;   Fair Remote;   Fair  Judgement:  Fair  Insight:  Present  Psychomotor Activity:  Normal  Concentration:  Fair  Recall:  [employer] of Knowledge:Fair  Language: Fair  Akathisia:  No  Handed:  Right  AIMS (if indicated):     Assets:  Desire for Improvement Housing Vocational/Educational  Sleep:  Number of Hours: 6.5  Cognition: WNL  ADL's:  Intact   Have you used any form of tobacco in the last 30 days? (Cigarettes, Smokeless Tobacco, Cigars, and/or Pipes): Yes  Has this patient used any form of tobacco in the last 30 days? (Cigarettes, Smokeless Tobacco, Cigars, and/or Pipes) Yes, Prescription not provided because: nicotine patches given  Mental Status Per Nursing Assessment::   On Admission:  Suicide plan  Current Mental Status by Physician: In full contact with reality. There are no active S/S of withdrawal. There are no active SI plans or intent. He is willing and motivated to pursue long term abstinence   Loss Factors: NA  Historical Factors: NA  Risk Reduction Factors:   Sense of responsibility to family, Employed,  Living with another person, especially a relative and Positive social support  Continued Clinical Symptoms:  Alcohol/Substance Abuse/Dependencies  Cognitive Features That Contribute To Risk:  Closed-mindedness, Polarized thinking and Thought constriction (tunnel vision)    Suicide Risk:  Minimal: No identifiable suicidal ideation.  Patients presenting with no risk factors but with morbid ruminations; may be classified as minimal risk based on the severity of the depressive symptoms  Principal Problem: Substance induced mood disorder Discharge Diagnoses:  Patient Active Problem List   Diagnosis Date Noted  . Alcohol dependence [F10.20] 06/01/2012    Priority: High  . Cocaine abuse [F14.10] 06/01/2012    Priority: High  . Substance induced mood disorder [F19.94] 06/01/2012    Priority: High  . Alcohol use disorder, severe, dependence [F10.20] 10/22/2014  . Polysubstance abuse [F19.10] 05/18/2014  . Alcohol dependence with alcohol-induced mood disorder [F10.24] 05/18/2014  . Alcoholic ketoacidosis [E87.2] 16/12/9602  . Abdominal pain [R10.9] 05/02/2013  . MDD (major depressive disorder) [F32.2] 04/11/2013  . Alcohol intoxication in active alcoholic [F10.229] 02/24/2013    Follow-up Information    Follow up with [facility].   Specialty:  Behavioral Health   Why:  Walk-in clinic Monday-Friday between 8 am to 3pm to get set up with therapy and medication management services. Please arrive early in the morning in order to be seen.   Contact information:   [address] [place] [address] [phone]       Plan Of Care/Follow-up recommendations:  Activity:  as tolerated Diet:  regular Follow up as above  Is patient on multiple antipsychotic therapies at discharge:  No   Has Patient had three or more failed trials of antipsychotic monotherapy by history:  No  Recommended Plan for Multiple Antipsychotic Therapies: NA    Adam [name] A 10/25/2014, 11:49 AM

## 2014-10-25 NOTE — Clinical Social Work Note (Signed)
2  Bus passes placed on chart.  Samuella Bruin, MSW, [employer] Clinical Social Worker [facility] [phone]
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
## 2014-10-26 ENCOUNTER — Encounter (HOSPITAL_COMMUNITY): Payer: Self-pay | Admitting: Emergency Medicine

## 2014-10-26 ENCOUNTER — Emergency Department (HOSPITAL_COMMUNITY)
Admission: EM | Admit: 2014-10-26 | Discharge: 2014-10-27 | Disposition: A | Discharge status: Home / Self Care | Payer: Self-pay | Attending: Emergency Medicine | Admitting: Emergency Medicine

## 2014-10-26 ENCOUNTER — Emergency Department (HOSPITAL_COMMUNITY): Payer: Self-pay

## 2014-10-26 DIAGNOSIS — T1491 Suicide attempt: Secondary | ICD-10-CM | POA: Insufficient documentation

## 2014-10-26 DIAGNOSIS — F1310 Sedative, hypnotic or anxiolytic abuse, uncomplicated: Secondary | ICD-10-CM | POA: Insufficient documentation

## 2014-10-26 DIAGNOSIS — F1410 Cocaine abuse, uncomplicated: Secondary | ICD-10-CM | POA: Insufficient documentation

## 2014-10-26 DIAGNOSIS — F10120 Alcohol abuse with intoxication, uncomplicated: Secondary | ICD-10-CM | POA: Insufficient documentation

## 2014-10-26 DIAGNOSIS — F1020 Alcohol dependence, uncomplicated: Secondary | ICD-10-CM | POA: Diagnosis present

## 2014-10-26 DIAGNOSIS — F1994 Other psychoactive substance use, unspecified with psychoactive substance-induced mood disorder: Secondary | ICD-10-CM | POA: Diagnosis present

## 2014-10-26 DIAGNOSIS — R079 Chest pain, unspecified: Secondary | ICD-10-CM | POA: Insufficient documentation

## 2014-10-26 DIAGNOSIS — F329 Major depressive disorder, single episode, unspecified: Secondary | ICD-10-CM | POA: Insufficient documentation

## 2014-10-26 DIAGNOSIS — F1910 Other psychoactive substance abuse, uncomplicated: Secondary | ICD-10-CM

## 2014-10-26 DIAGNOSIS — T1491XA Suicide attempt, initial encounter: Secondary | ICD-10-CM

## 2014-10-26 DIAGNOSIS — Z720 Tobacco use: Secondary | ICD-10-CM | POA: Insufficient documentation

## 2014-10-26 DIAGNOSIS — F332 Major depressive disorder, recurrent severe without psychotic features: Secondary | ICD-10-CM | POA: Diagnosis present

## 2014-10-26 LAB — RAPID URINE DRUG SCREEN, HOSP PERFORMED
Amphetamines: NOT DETECTED
BARBITURATES: NOT DETECTED
BENZODIAZEPINES: POSITIVE — AB
COCAINE: POSITIVE — AB
OPIATES: NOT DETECTED
Tetrahydrocannabinol: NOT DETECTED

## 2014-10-26 LAB — COMPREHENSIVE METABOLIC PANEL
ALT: 35 U/L (ref 17–63)
AST: 28 U/L (ref 15–41)
Albumin: 3.8 g/dL (ref 3.5–5.0)
Alkaline Phosphatase: 55 U/L (ref 38–126)
Anion gap: 11 (ref 5–15)
BILIRUBIN TOTAL: 0.7 mg/dL (ref 0.3–1.2)
BUN: 11 mg/dL (ref 6–20)
CALCIUM: 9.1 mg/dL (ref 8.9–10.3)
CHLORIDE: 108 mmol/L (ref 101–111)
CO2: 24 mmol/L (ref 22–32)
Creatinine, Ser: 0.87 mg/dL (ref 0.61–1.24)
GFR calc non Af Amer: >60 mL/min (ref >60)
GLUCOSE: 99 mg/dL (ref 65–99)
Potassium: 3.6 mmol/L (ref 3.5–5.1)
Sodium: 143 mmol/L (ref 135–145)
TOTAL PROTEIN: 7.4 g/dL (ref 6.5–8.1)

## 2014-10-26 LAB — CBC
HCT: 41.5 % (ref 39.0–52.0)
Hemoglobin: 14.3 g/dL (ref 13.0–17.0)
MCH: 29.9 pg (ref 26.0–34.0)
MCHC: 34.5 g/dL (ref 30.0–36.0)
MCV: 86.8 fL (ref 78.0–100.0)
Platelets: 212 10*3/uL (ref 150–400)
RBC: 4.78 MIL/uL (ref 4.22–5.81)
RDW: 15.1 % (ref 11.5–15.5)
WBC: 9.2 10*3/uL (ref 4.0–10.5)

## 2014-10-26 LAB — ACETAMINOPHEN LEVEL

## 2014-10-26 LAB — I-STAT TROPONIN, ED
Troponin i, poc: 0.01 ng/mL (ref 0.00–0.08)
Troponin i, poc: 0 ng/mL (ref 0.00–0.08)

## 2014-10-26 LAB — ETHANOL: ALCOHOL ETHYL (B): 42 mg/dL — AB (ref <5)

## 2014-10-26 LAB — SALICYLATE LEVEL: Salicylate Lvl: <4 mg/dL (ref 2.8–30.0)

## 2014-10-26 MED ORDER — ONDANSETRON HCL 4 MG PO TABS: 4.0000 mg | ORAL_TABLET | Freq: Three times a day (TID) | ORAL | Status: DC | PRN

## 2014-10-26 MED ORDER — ALUM & MAG HYDROXIDE-SIMETH 200-200-20 MG/5ML PO SUSP: 30.0000 mL | ORAL | Status: DC | PRN

## 2014-10-26 MED ORDER — LORAZEPAM 1 MG PO TABS
0.0000 mg | ORAL_TABLET | Freq: Four times a day (QID) | ORAL | Status: DC
  Administered 2014-10-26 – 2014-10-27 (×2): 1 mg via ORAL
  Filled 2014-10-26: qty 1

## 2014-10-26 MED ORDER — SODIUM CHLORIDE 0.9 % IV BOLUS (SEPSIS)
1000.0000 mL | Freq: Once | INTRAVENOUS | Status: AC
  Administered 2014-10-26: 1000 mL via INTRAVENOUS

## 2014-10-26 MED ORDER — LORAZEPAM 1 MG PO TABS
1.0000 mg | ORAL_TABLET | Freq: Three times a day (TID) | ORAL | Status: DC | PRN
  Filled 2014-10-26: qty 1

## 2014-10-26 MED ORDER — ZOLPIDEM TARTRATE 5 MG PO TABS
5.0000 mg | ORAL_TABLET | Freq: Every evening | ORAL | Status: DC | PRN
  Administered 2014-10-26: 5 mg via ORAL
  Filled 2014-10-26: qty 1

## 2014-10-26 MED ORDER — LORAZEPAM 1 MG PO TABS: 0.0000 mg | ORAL_TABLET | Freq: Two times a day (BID) | ORAL | Status: DC

## 2014-10-26 MED ORDER — NICOTINE 21 MG/24HR TD PT24: 21.0000 mg | MEDICATED_PATCH | Freq: Every day | TRANSDERMAL | Status: DC

## 2014-10-26 MED ORDER — IBUPROFEN 200 MG PO TABS: 600.0000 mg | ORAL_TABLET | Freq: Three times a day (TID) | ORAL | Status: DC | PRN

## 2014-10-26 MED ORDER — ACETAMINOPHEN 325 MG PO TABS: 650.0000 mg | ORAL_TABLET | ORAL | Status: DC | PRN

## 2014-10-26 NOTE — ED Notes (Signed)
PA @ bedside.

## 2014-10-26 NOTE — Progress Notes (Signed)
Received report from [place], Charity fundraiser.   TTS to initiate assessment once equipment is placed in patient's room.    Janann Colonel. MSW, LCSW Therapeutic Triage [facility] Specialist

## 2014-10-26 NOTE — ED Notes (Signed)
Pt is currently videoconferencing with TTS.  Safety sitter is still present.

## 2014-10-26 NOTE — ED Notes (Signed)
Pt sleeping comfortably in bed with safety sitter at bedside.  No acute distress noted.

## 2014-10-26 NOTE — ED Notes (Signed)
Pt is resting comfortably in bed with safety sitter at the bedside.  He denies the need for a nicotine patch and CIWA at this time is -0-. No acute distress noted.

## 2014-10-26 NOTE — ED Notes (Signed)
Pt now adds that he is having chest pain "because I smoked so much crack."

## 2014-10-26 NOTE — ED Notes (Signed)
Called report to Psych ED.  Waiting on security to wand pt prior to transfer.

## 2014-10-26 NOTE — ED Provider Notes (Signed)
CSN: [id number]     Arrival date & time 10/26/14  4098 History   First MD Initiated Contact with Patient 10/26/14 0725     Chief Complaint  Patient presents with  . Suicidal  . Alcohol Intoxication  . Chest Pain     (Consider location/radiation/quality/duration/timing/severity/associated sxs/prior Treatment) The history is provided by the patient.     Pt p/w alcohol and crack intoxication and suicide attempt.  States he had a gun and was going to shoot himself in the head but his friends took the gun from him.  States he has been depressed for awhile.  Denies homicidal ideation.  Denies visual or auditory hallucinations.   He did have an episode of sharp chest pain while he was smoking the crack that resolved quickly.  Denies current pain.  Denies SOB.    History reviewed. No pertinent past medical history. History reviewed. No pertinent past surgical history. No family history on file. Social History  Substance Use Topics  . Smoking status: Current Every Day Smoker -- 1.00 packs/day    Types: Cigarettes  . Smokeless tobacco: None  . Alcohol Use: Yes     Comment:  " 9 or 10 40's per day"     Review of Systems  All other systems reviewed and are negative.     Allergies  Review of patient's allergies indicates no known allergies.  Home Medications   Prior to Admission medications   Not on File   BP 113/53 mmHg  Pulse 100  Temp(Src) 97.8 F (36.6 C) (Oral)  Resp 15  SpO2 96% Physical Exam  Constitutional: He appears well-developed and well-nourished. No distress.  Intoxicated  HENT:  Head: Normocephalic and atraumatic.  Neck: Neck supple.  Cardiovascular: Normal rate and regular rhythm.   Pulmonary/Chest: Effort normal and breath sounds normal. No respiratory distress. He has no wheezes. He has no rales.  Abdominal: Soft. He exhibits no distension and no mass. There is no tenderness. There is no rebound and no guarding.  Neurological: He is alert. He exhibits  normal muscle tone.  Skin: He is not diaphoretic.  Nursing note and vitals reviewed.   ED Course  Procedures (including critical care time) Labs Review Labs Reviewed  [name] - Abnormal; Notable for the following:    Alcohol, Ethyl (B) 42 (*)    All other components within normal limits  URINE RAPID DRUG SCREEN, HOSP PERFORMED - Abnormal; Notable for the following:    Cocaine POSITIVE (*)    Benzodiazepines POSITIVE (*)    All other components within normal limits  ACETAMINOPHEN LEVEL - Abnormal; Notable for the following:    Acetaminophen (Tylenol), Serum <10 (*)    All other components within normal limits  COMPREHENSIVE METABOLIC PANEL  CBC  SALICYLATE LEVEL  I-STAT TROPOININ, ED  Rosezena Sensor, ED    Imaging Review Dg Chest 2 View  10/26/2014   CLINICAL DATA:  Chest pain.  EXAM: CHEST  2 VIEW  COMPARISON:  None.  FINDINGS: The heart size and mediastinal contours are within normal limits. Both lungs are clear. The visualized skeletal structures are unremarkable.  IMPRESSION: Normal chest.   Electronically Signed   By: Francene Boyers M.D.   On: 10/26/2014 08:09   I, Clista Rainford, personally reviewed and evaluated these images and lab results as part of my medical decision-making.   EKG Interpretation   Date/Time:  Saturday October 26 2014 07:49:49 EDT Ventricular Rate:  90 PR Interval:  142 QRS Duration: 86 QT Interval:  361 QTC Calculation: 442 R Axis:   31 Text Interpretation:  Sinus rhythm ST elev, probable normal early repol  pattern no previous tracing available for comparison Confirmed by LITTLE  MD, RACHEL [phone]) on 10/26/2014 7:54:59 AM     EKG Interpretation  Date/Time:  Saturday October 26 2014 11:46:17 EDT Ventricular Rate:  89 PR Interval:  151 QRS Duration: 86 QT Interval:  334 QTC Calculation: 406 R Axis:   69 Text Interpretation:  Sinus rhythm Anteroseptal infarct, old Confirmed by Freida Busman  MD, ANTHONY ([id number]) on 10/26/2014 1:44:12 PM        7:54 AM Reviewed EKG with Dr Freida Busman and Dr Clarene Duke.  Will repeat EKG.    8:11 AM Pt sleeping in room.  Repeat EKG reviewed with Dr Freida Busman.  No concerning findings, changes consistent with early repolarization.    MDM   Final diagnoses:  Polysubstance abuse  Suicide attempt    Pt brought in after attempting to kill himself, holding a gun to his head. He was drinking alcohol and smoking crack at the time.  Pt did have an episode of sharp chest pain while smoking crack that resolved soon afterward.  EKG and repeat EKG unremarkable, repeat troponins negative.  Pt is currently voluntary.  To be seen by TTS, anticipate placement.      Trixie Dredge, PA-C 10/26/14 1557  Lorre Nick, MD 10/27/14 [phone]

## 2014-10-26 NOTE — BH Assessment (Signed)
Tele Assessment Note   Adam [name] is an 29 y.o. male who presents with GPD with the presenting problem of suicidal ideations with plan to shoot himself in the head. He shares that he has been using alcohol and crack cocaine daily to cope with his depression. He states the he drinks six to seven 40oz of beer/liquor daily in addition to using $300 worth of crack cocaine. Patient endorses depressive symptoms that include insomnia, feelings of anger/irritability, fatigue, and social isolation. He shares that he was previously living with his ex-girlfriend but now is homeless for unspecified reasons. Patient reports that he does have access to firearms and decided to seek mental health treatment prior to committing suicide. Patient states that he has a family history of depression yet no family members who have attempted suicide at this time. Patient reports that he received previous detox at a facility in [facility] for 3 days but was unable to identify the name of this facility to clinician. Patient states that he has no familial support at this time due to his parents being sick with medical issues. Patient denies HI/AVH at this time and seeks inpatient treatment for crisis stabilization and medication management.   Axis I: Major Depression, single episode and Alcohol Use Disorder, Moderate  Past Medical History: History reviewed. No pertinent past medical history.  History reviewed. No pertinent past surgical history.  Family History: No family history on file.  Social History:  reports that he has been smoking Cigarettes.  He has been smoking about 1.00 pack per day. He does not have any smokeless tobacco history on file. He reports that he drinks alcohol. He reports that he uses illicit drugs (Cocaine and "Crack" cocaine).  Additional Social History:  Alcohol / Drug Use History of alcohol / drug use?: Yes Substance #1 Name of Substance 1: Alcohol 1 - Age of First Use: 15 1 - Amount  (size/oz): varies 1 - Frequency: daily 1 - Duration: years 1 - Last Use / Amount: 10/26/2014: Six 40oz beers Substance #2 Name of Substance 2: Crack Cocaine 2 - Age of First Use: 18 2 - Amount (size/oz): varies 2 - Frequency: daily 2 - Duration: years 2 - Last Use / Amount: 10/26/2014: $300 worth per patient  CIWA: CIWA-Ar BP: 101/61 mmHg Pulse Rate: 92 Nausea and Vomiting: no nausea and no vomiting Tactile Disturbances: none Tremor: no tremor Auditory Disturbances: not present Paroxysmal Sweats: no sweat visible Visual Disturbances: not present Anxiety: no anxiety, at ease Headache, Fullness in Head: none present Agitation: normal activity Orientation and Clouding of Sensorium: oriented and can do serial additions CIWA-Ar Total: 0 COWS:    PATIENT STRENGTHS: (choose at least two) Ability for insight Communication skills  Allergies: No Known Allergies  Home Medications:  (Not in a hospital admission)  OB/GYN Status:  No LMP for male patient.  General Assessment Data Location of Assessment: [facility] TTS Assessment: In system Is this a Tele or Face-to-Face Assessment?: Face-to-Face Is this an Initial Assessment or a Re-assessment for this encounter?: Initial Assessment Marital status: Single Is patient pregnant?: No Pregnancy Status: No Living Arrangements: Other (Comment) (Homeless) Can pt return to current living arrangement?: Yes Admission Status: Voluntary Is patient capable of signing voluntary admission?: Yes Referral Source: Self/Family/Friend Insurance type: Self Pay     Crisis Care Plan Living Arrangements: Other (Comment) (Homeless) Name of Psychiatrist: None Name of Therapist: None  Education Status Is patient currently in school?: No  Risk to self with the past 6  months Suicidal Ideation: Yes-Currently Present Has patient been a risk to self within the past 6 months prior to admission? : Yes Suicidal Intent: Yes-Currently Present Has patient  had any suicidal intent within the past 6 months prior to admission? : Yes Is patient at risk for suicide?: Yes Suicidal Plan?: Yes-Currently Present Has patient had any suicidal plan within the past 6 months prior to admission? : Yes Specify Current Suicidal Plan: Plan to shoot self in the head  Access to Means: Yes Specify Access to Suicidal Means: Access to firearms What has been your use of drugs/alcohol within the last 12 months?: Alcohol and Crack Cocaine Previous Attempts/Gestures: No How many times?: 0 Triggers for Past Attempts: None known Intentional Self Injurious Behavior: None Family Suicide History: No Recent stressful life event(s): Conflict (Comment), Financial Problems Persecutory voices/beliefs?: No Depression: Yes Depression Symptoms: Insomnia, Isolating, Feeling worthless/self pity, Feeling angry/irritable Substance abuse history and/or treatment for substance abuse?: Yes  Risk to Others within the past 6 months Homicidal Ideation: No Does patient have any lifetime risk of violence toward others beyond the six months prior to admission? : No Thoughts of Harm to Others: No Current Homicidal Intent: No Current Homicidal Plan: No Access to Homicidal Means: No Identified Victim: None reported History of harm to others?: No Assessment of Violence: None Noted Violent Behavior Description: Patient is calm and cooperative during assessment Does patient have access to weapons?: Yes (Comment) Criminal Charges Pending?: No Does patient have a court date: No Is patient on probation?: No  Psychosis Hallucinations: None noted Delusions: None noted  Mental Status Report Appearance/Hygiene: In scrubs Eye Contact: Fair Motor Activity: Unremarkable Speech: Soft, Slow Level of Consciousness: Sedated Mood: Depressed Affect: Depressed Anxiety Level: Minimal Thought Processes: Coherent, Relevant Judgement: Impaired Orientation: Person, Place, Time, Situation Obsessive  Compulsive Thoughts/Behaviors: None  Cognitive Functioning Concentration: Decreased Memory: Recent Intact, Remote Intact IQ: Average Insight: Poor Impulse Control: Poor Appetite: Poor Weight Loss: 4 Weight Gain: 0 Sleep: Increased Total Hours of Sleep: 1 Vegetative Symptoms: None  ADLScreening [facility] Assessment Services) Patient's cognitive ability adequate to safely complete daily activities?: Yes Patient able to express need for assistance with ADLs?: Yes Independently performs ADLs?: Yes (appropriate for developmental age)  Prior Inpatient Therapy Prior Inpatient Therapy: Yes Prior Therapy Dates: July 2016 Prior Therapy Facilty/Provider(s): Facility in [place] Reason for Treatment: Detox  Prior Outpatient Therapy Prior Outpatient Therapy: No  ADL Screening (condition at time of admission) Patient's cognitive ability adequate to safely complete daily activities?: Yes Is the patient deaf or have difficulty hearing?: No Does the patient have difficulty seeing, even when wearing glasses/contacts?: No Does the patient have difficulty concentrating, remembering, or making decisions?: No Patient able to express need for assistance with ADLs?: Yes Does the patient have difficulty dressing or bathing?: No Independently performs ADLs?: Yes (appropriate for developmental age) Does the patient have difficulty walking or climbing stairs?: No Weakness of Legs: None Weakness of Arms/Hands: None  Home Assistive Devices/Equipment Home Assistive Devices/Equipment: None  Therapy Consults (therapy consults require a physician order) PT Evaluation Needed: No OT Evalulation Needed: No SLP Evaluation Needed: No Abuse/Neglect Assessment (Assessment to be complete while patient is alone) Physical Abuse: Denies Verbal Abuse: Denies Sexual Abuse: Denies Exploitation of patient/patient's resources: Denies Self-Neglect: Denies Values / Beliefs Cultural Requests During  Hospitalization: None Spiritual Requests During Hospitalization: None Consults Spiritual Care Consult Needed: No Social Work Consult Needed: No Merchant navy officer (For Healthcare) Does patient have an advance directive?: No Would patient  like information on creating an advanced directive?: No - patient declined information    Additional Information 1:1 In Past 12 Months?: No CIRT Risk: No Elopement Risk: No Does patient have medical clearance?: Yes     Disposition:  Disposition Initial Assessment Completed for this Encounter: Yes Disposition of Patient: Inpatient treatment program  Haskel Khan 10/26/2014 1:54 PM

## 2014-10-26 NOTE — ED Notes (Signed)
Patient transported to X-ray 

## 2014-10-26 NOTE — ED Notes (Addendum)
Pt from home via GPD c/o SI and ETOH use. When asked about self harm, pt reports " I don't want to be here any longer because I can't help my family." Pt states that he drank 7-40 oz beers, 2 pints liquor and smoked $350 of crack cocaine.Pt adds " I put a gun to my head and my friends stopped me this morning." Pt has slurred speech and has difficulty staying awake in triage, but awaked to voice commands and answers questions appropriately. Pt in NAD.

## 2014-10-26 NOTE — Progress Notes (Signed)
Per Aggie,NP patient meets inpatient criteria at this time. TTS to seek placement at surrounding facilities for treatment.    Janann Colonel. MSW, LCSW Therapeutic Triage [facility] Specialist

## 2014-10-26 NOTE — Progress Notes (Signed)
Disposition CSW completed referrals for patient to the following inpatient psych facilities:  [facility]  CSW will continue to assist as needed.  [facility] Disposition CSW [phone]

## 2014-10-26 NOTE — ED Notes (Signed)
Bed: [facility] Expected date:  Expected time:  Means of arrival:  Comments: Hold for Rm 4

## 2014-10-26 NOTE — Progress Notes (Signed)
D Pt. Denies SI and HI, no complaints of pain or discomfort noted at present time.  A Writer offered support and encouragement, discussed reason for pt.'s admission.  R Pt. Remains safe on the unit,  Is requesting to be discharged stating he just had too much to drink and has no thoughts of self harm.

## 2014-10-26 NOTE — ED Notes (Signed)
Pt oriented to room and unit.  Pt is alert and oriented.  His speech is slurred and he appears very groggy.  He complains of SI but is able to contract for safety.  Denies HI and AVH.  15 minute checks in place.

## 2014-10-27 DIAGNOSIS — F1020 Alcohol dependence, uncomplicated: Secondary | ICD-10-CM | POA: Diagnosis present

## 2014-10-27 DIAGNOSIS — F329 Major depressive disorder, single episode, unspecified: Secondary | ICD-10-CM

## 2014-10-27 DIAGNOSIS — F1910 Other psychoactive substance abuse, uncomplicated: Secondary | ICD-10-CM | POA: Insufficient documentation

## 2014-10-27 DIAGNOSIS — F332 Major depressive disorder, recurrent severe without psychotic features: Secondary | ICD-10-CM | POA: Diagnosis present

## 2014-10-27 DIAGNOSIS — F1994 Other psychoactive substance use, unspecified with psychoactive substance-induced mood disorder: Secondary | ICD-10-CM | POA: Diagnosis present

## 2014-10-27 DIAGNOSIS — F17210 Nicotine dependence, cigarettes, uncomplicated: Secondary | ICD-10-CM

## 2014-10-27 NOTE — ED Notes (Signed)
Discharge instructions reviewed with pt. All belongings returned to patient with bus pass. Pt to follow-up with [employer]. Pt discharged home.

## 2014-10-27 NOTE — Consult Note (Signed)
[place] [facility] Consult   Reason for Consult:  Statements made when intoxicated Referring Physician:  EDP Patient Identification: Adam [name] MRN:  [id number] Principal Diagnosis: Substance induced mood disorder Diagnosis:   Patient Active Problem List   Diagnosis Date Noted  . Alcohol use disorder, severe, dependence [F10.20] 10/27/2014    Priority: High  . Substance induced mood disorder [F19.94] 10/27/2014    Priority: High  . MDD (major depressive disorder), recurrent severe, without psychosis [F33.2] 10/27/2014    Priority: High  . Polysubstance abuse [F19.10]    Total Time spent with patient: 25 minutes  Subjective:   Adam [name] is a 29 y.o. male patient admitted with reports of having made suicidal statements while intoxicated. Pt sobered up and is now contracting for safety and asking to leave. Pt seen and chart reviewed with NP and MD. Pt well-known to this provider from numerous consults (use patient station to find his other chart with his photo). Pt denies suicidal/homicidal ideation and psychosis and does not appear to be responding to internal stimuli. Pt reports that his primary concern is drinking alcohol and smoking crack every 3 days or so and that this has been happening for years. Pt cites recent stressors to include starting a new job, caring for his family including his mother and father who are both very ill, and helping his girlfriend move into a new home in which e will also be living.   HPI:  I have reviewed and concur with HPI below, modified as follows: Adam [name] is a 29 y.o. male who presents with GPD with the presenting problem of suicidal ideations with plan to shoot himself in the head. He shares that he has been using alcohol and crack cocaine daily to cope with his depression. He states the he drinks six to seven 40oz of beer/liquor daily in addition to using $300 worth of crack cocaine. Patient endorses depressive symptoms that include  insomnia, feelings of anger/irritability, fatigue, and social isolation. He shares that he was previously living with his ex-girlfriend but now is homeless for unspecified reasons. Patient reports that he does have access to firearms and decided to seek mental health treatment prior to committing suicide. Patient states that he has a family history of depression yet no family members who have attempted suicide at this time. Patient reports that he received previous detox at a facility in [facility] for 3 days but was unable to identify the name of this facility to clinician. Patient states that he has no familial support at this time due to his parents being sick with medical issues. Patient denies HI/AVH at this time and seeks inpatient treatment for crisis stabilization and medication management.   Pt spent the night in the ED ans has improved from his intoxicated state upon arrival (and was high on drugs). Nursing staff report that pt has been cooperative as well. Pt initially gave a false name when he came in because he has a court date coming up, but then told the truth.    HPI Elements:   Location:  Psychiatric. Quality:  Improving, baseline. Severity:  Moderate. Timing:  Intermittent. Duration:  Transient. Context:  Exacerbation of underlying history of chronic severe alcohlism without current withdrawal symptoms, with potential triggers to include moving into a new home and starting a new job.   Past Medical History: History reviewed. No pertinent past medical history. History reviewed. No pertinent past surgical history. Family History: No family history on file. Social History:  History  Alcohol Use  . Yes    Comment:  " 9 or 10 40's per day"      History  Drug Use  . Yes  . Special: Cocaine, "Crack" cocaine    Social History   Social History  . Marital Status: Single    Spouse Name: N/A  . Number of Children: N/A  . Years of Education: N/A   Social History Main Topics   . Smoking status: Current Every Day Smoker -- 1.00 packs/day    Types: Cigarettes  . Smokeless tobacco: None  . Alcohol Use: Yes     Comment:  " 9 or 10 40's per day"   . Drug Use: Yes    Special: Cocaine, "Crack" cocaine  . Sexual Activity: Not Asked   Other Topics Concern  . None   Social History Narrative  . None   Additional Social History:    History of alcohol / drug use?: Yes Name of Substance 1: Alcohol 1 - Age of First Use: 15 1 - Amount (size/oz): varies 1 - Frequency: daily 1 - Duration: years 1 - Last Use / Amount: 10/26/2014: Six 40oz beers Name of Substance 2: Crack Cocaine 2 - Age of First Use: 18 2 - Amount (size/oz): varies 2 - Frequency: daily 2 - Duration: years 2 - Last Use / Amount: 10/26/2014: $300 worth per patient                 Allergies:  No Known Allergies  Labs:  Results for orders placed or performed during the hospital encounter of 10/26/14 (from the past 48 hour(s))  Comprehensive metabolic panel     Status: None   Collection Time: 10/26/14  8:15 AM  Result Value Ref Range   Sodium 143 135 - 145 mmol/L   Potassium 3.6 3.5 - 5.1 mmol/L   Chloride 108 101 - 111 mmol/L   CO2 24 22 - 32 mmol/L   Glucose, Bld 99 65 - 99 mg/dL   BUN 11 6 - 20 mg/dL   Creatinine, Ser 0.87 0.61 - 1.24 mg/dL   Calcium 9.1 8.9 - 10.3 mg/dL   Total Protein 7.4 6.5 - 8.1 g/dL   Albumin 3.8 3.5 - 5.0 g/dL   AST 28 15 - 41 U/L   ALT 35 17 - 63 U/L   Alkaline Phosphatase 55 38 - 126 U/L   Total Bilirubin 0.7 0.3 - 1.2 mg/dL   GFR calc non Af Amer >60 >60 mL/min   GFR calc Af Amer >60 >60 mL/min    Comment: (NOTE) The eGFR has been calculated using the CKD EPI equation. This calculation has not been validated in all clinical situations. eGFR's persistently <60 mL/min signify possible Chronic Kidney Disease.    Anion gap 11 5 - 15  [name] (ETOH)     Status: Abnormal   Collection Time: 10/26/14  8:15 AM  Result Value Ref Range   Alcohol, Ethyl (B)  42 (H) <5 mg/dL    Comment:        LOWEST DETECTABLE LIMIT FOR SERUM ALCOHOL IS 5 mg/dL FOR MEDICAL PURPOSES ONLY   CBC     Status: None   Collection Time: 10/26/14  8:15 AM  Result Value Ref Range   WBC 9.2 4.0 - 10.5 K/uL   RBC 4.78 4.22 - 5.81 MIL/uL   Hemoglobin 14.3 13.0 - 17.0 g/dL   HCT 41.5 39.0 - 52.0 %   MCV 86.8 78.0 - 100.0 fL   MCH 29.9  26.0 - 34.0 pg   MCHC 34.5 30.0 - 36.0 g/dL   RDW 15.1 11.5 - 15.5 %   Platelets 212 150 - 400 K/uL  Acetaminophen level     Status: Abnormal   Collection Time: 10/26/14  8:15 AM  Result Value Ref Range   Acetaminophen (Tylenol), Serum <10 (L) 10 - 30 ug/mL    Comment:        THERAPEUTIC CONCENTRATIONS VARY SIGNIFICANTLY. A RANGE OF 10-30 ug/mL MAY BE AN EFFECTIVE CONCENTRATION FOR MANY PATIENTS. HOWEVER, SOME ARE BEST TREATED AT CONCENTRATIONS OUTSIDE THIS RANGE. ACETAMINOPHEN CONCENTRATIONS >150 ug/mL AT 4 HOURS AFTER INGESTION AND >50 ug/mL AT 12 HOURS AFTER INGESTION ARE OFTEN ASSOCIATED WITH TOXIC REACTIONS.   Salicylate level     Status: None   Collection Time: 10/26/14  8:15 AM  Result Value Ref Range   Salicylate Lvl <1.1 2.8 - 30.0 mg/dL  I-stat troponin, ED     Status: None   Collection Time: 10/26/14  8:27 AM  Result Value Ref Range   Troponin i, poc 0.01 0.00 - 0.08 ng/mL   Comment 3            Comment: Due to the release kinetics of cTnI, a negative result within the first hours of the onset of symptoms does not rule out myocardial infarction with certainty. If myocardial infarction is still suspected, repeat the test at appropriate intervals.   Urine rapid drug screen (hosp performed) (Not at [facility])     Status: Abnormal   Collection Time: 10/26/14 10:00 AM  Result Value Ref Range   Opiates NONE DETECTED NONE DETECTED   Cocaine POSITIVE (A) NONE DETECTED   Benzodiazepines POSITIVE (A) NONE DETECTED   Amphetamines NONE DETECTED NONE DETECTED   Tetrahydrocannabinol NONE DETECTED NONE DETECTED    Barbiturates NONE DETECTED NONE DETECTED    Comment:        DRUG SCREEN FOR MEDICAL PURPOSES ONLY.  IF CONFIRMATION IS NEEDED FOR ANY PURPOSE, NOTIFY LAB WITHIN 5 DAYS.        LOWEST DETECTABLE LIMITS FOR URINE DRUG SCREEN Drug Class       Cutoff (ng/mL) Amphetamine      1000 Barbiturate      200 Benzodiazepine   657 Tricyclics       903 Opiates          300 Cocaine          300 THC              50   I-stat troponin, ED     Status: None   Collection Time: 10/26/14 11:49 AM  Result Value Ref Range   Troponin i, poc 0.00 0.00 - 0.08 ng/mL   Comment 3            Comment: Due to the release kinetics of cTnI, a negative result within the first hours of the onset of symptoms does not rule out myocardial infarction with certainty. If myocardial infarction is still suspected, repeat the test at appropriate intervals.     Vitals: Blood pressure 147/77, pulse 68, temperature 97.7 F (36.5 C), temperature source Oral, resp. rate 16, SpO2 100 %.  Risk to Self: Suicidal Ideation: Yes-Currently Present Suicidal Intent: Yes-Currently Present Is patient at risk for suicide?: Yes Suicidal Plan?: Yes-Currently Present Specify Current Suicidal Plan: Plan to shoot self in the head  Access to Means: Yes Specify Access to Suicidal Means: Access to firearms What has been your use of drugs/alcohol within the last  12 months?: Alcohol and Crack Cocaine How many times?: 0 Triggers for Past Attempts: None known Intentional Self Injurious Behavior: None Risk to Others: Homicidal Ideation: No Thoughts of Harm to Others: No Current Homicidal Intent: No Current Homicidal Plan: No Access to Homicidal Means: No Identified Victim: None reported History of harm to others?: No Assessment of Violence: None Noted Violent Behavior Description: Patient is calm and cooperative during assessment Does patient have access to weapons?: Yes (Comment) Criminal Charges Pending?: No Does patient have a court  date: No Prior Inpatient Therapy: Prior Inpatient Therapy: Yes Prior Therapy Dates: July 2016 Prior Therapy Facilty/Provider(s): Facility in [facility] Reason for Treatment: Detox Prior Outpatient Therapy: Prior Outpatient Therapy: No  Current Facility-Administered Medications  Medication Dose Route Frequency Provider Last Rate Last Dose  . acetaminophen (TYLENOL) tablet 650 mg  650 mg Oral Q4H PRN Clayton Bibles, PA-C      . alum & mag hydroxide-simeth (MAALOX/MYLANTA) 200-200-20 MG/5ML suspension 30 mL  30 mL Oral PRN Clayton Bibles, PA-C      . ibuprofen (ADVIL,MOTRIN) tablet 600 mg  600 mg Oral Q8H PRN Clayton Bibles, PA-C      . LORazepam (ATIVAN) tablet 0-4 mg  0-4 mg Oral 4 times per day Clayton Bibles, PA-C   1 mg at 10/27/14 1275   Followed by  . [START ON 10/28/2014] LORazepam (ATIVAN) tablet 0-4 mg  0-4 mg Oral Q12H Clayton Bibles, PA-C      . LORazepam (ATIVAN) tablet 1 mg  1 mg Oral Q8H PRN Clayton Bibles, PA-C      . nicotine (NICODERM CQ - dosed in mg/24 hours) patch 21 mg  21 mg Transdermal Daily Emily West, PA-C   21 mg at 10/26/14 1311  . ondansetron (ZOFRAN) tablet 4 mg  4 mg Oral Q8H PRN Clayton Bibles, PA-C      . zolpidem (AMBIEN) tablet 5 mg  5 mg Oral QHS PRN Clayton Bibles, PA-C   5 mg at 10/26/14 2113   No current outpatient prescriptions on file.    Musculoskeletal: Strength & Muscle Tone: within normal limits Gait & Station: normal Patient leans: N/A  Psychiatric Specialty Exam: Physical Exam  Review of Systems  Psychiatric/Behavioral: Positive for depression and substance abuse. Negative for suicidal ideas and hallucinations. The patient is nervous/anxious and has insomnia.   All other systems reviewed and are negative.   Blood pressure 147/77, pulse 68, temperature 97.7 F (36.5 C), temperature source Oral, resp. rate 16, SpO2 100 %.There is no height or weight on file to calculate BMI.  General Appearance: Casual and Fairly Groomed  Engineer, water::  Good  Speech:  Clear and  Coherent and Normal Rate  Volume:  Normal  Mood:  Euthymic  Affect:  Appropriate and Congruent  Thought Process:  Coherent and Goal Directed  Orientation:  Full (Time, Place, and Person)  Thought Content:  WDL  Suicidal Thoughts:  No  Homicidal Thoughts:  No  Memory:  Immediate;   Fair Recent;   Fair Remote;   Fair  Judgement:  Fair  Insight:  Fair  Psychomotor Activity:  Normal  Concentration:  Good  Recall:  Good  Fund of Knowledge:Good  Language: Good  Akathisia:  No  Handed:    AIMS (if indicated):     Assets:  Communication Skills Desire for Improvement Resilience Social Support  ADL's:  Intact  Cognition: WNL  Sleep:      Medical Decision Making: Self-Limited or Minor (1), Review of Psycho-Social Stressors (1),  Review or order clinical lab tests (1), Review of Medication Regimen & Side Effects (2) and Review of New Medication or Change in Dosage (2)  Treatment Plan Summary: Daily contact with patient to assess and evaluate symptoms and progress in treatment and Medication management  Plan:  No evidence of imminent risk to self or others at present.   Patient does not meet criteria for psychiatric inpatient admission. Supportive therapy provided about ongoing stressors. Refer to IOP. Discussed crisis plan, support from social network, calling 911, coming to the Emergency Department, and calling Suicide Hotline.  Disposition:  -Discharge home -Give pt outpatient resources for alcoholism and depression  Benjamine Mola, FNP-BC 10/27/2014 1:23 PM   Patient seen and I agree with treatment and plan  Levonne Spiller M.D.

## 2014-10-27 NOTE — BHH Suicide Risk Assessment (Signed)
[facility] Discharge Suicide Risk Assessment   Demographic Factors:  Male, Adolescent or young adult and Low socioeconomic status  Total Time spent with patient: 25 minutes  Musculoskeletal: Strength & Muscle Tone: within normal limits Gait & Station: normal Patient leans: N/A  Psychiatric Specialty Exam:     Blood pressure 147/77, pulse 68, temperature 97.7 F (36.5 C), temperature source Oral, resp. rate 16, SpO2 100 %.There is no height or weight on file to calculate BMI.     General Appearance: Casual and Fairly Groomed  Patent attorney:: Good  Speech: Clear and Coherent and Normal Rate  Volume: Normal  Mood: Euthymic  Affect: Appropriate and Congruent  Thought Process: Coherent and Goal Directed  Orientation: Full (Time, Place, and Person)  Thought Content: WDL  Suicidal Thoughts: No  Homicidal Thoughts: No  Memory: Immediate; Fair Recent; Fair Remote; Fair  Judgement: Fair  Insight: Fair  Psychomotor Activity: Normal  Concentration: Good  Recall: Good  Fund of Knowledge:Good  Language: Good  Akathisia: No  Handed:   AIMS (if indicated):    Assets: Communication Skills Desire for Improvement Resilience Social Support  ADL's: Intact  Cognition: WNL  Sleep:             Has this patient used any form of tobacco in the last 30 days? (Cigarettes, Smokeless Tobacco, Cigars, and/or Pipes) No  Mental Status Per Nursing Assessment::   On Admission:     Current Mental Status by Physician: Adam [name] is a 29 y.o. male patient admitted with reports of having made suicidal statements while intoxicated. Pt sobered up and is now contracting for safety and asking to leave. Pt seen and chart reviewed with NP and MD. Pt well-known to this provider from numerous consults (use patient station to find his other chart with his photo). Pt denies suicidal/homicidal ideation and psychosis and does not appear to be responding to  internal stimuli. Pt reports that his primary concern is drinking alcohol and smoking crack every 3 days or so and that this has been happening for years. Pt cites recent stressors to include starting a new job, caring for his family including his mother and father who are both very ill, and helping his girlfriend move into a new home in which e will also be living.   Loss Factors: Problems with family being very ill   Historical Factors: Impulsivity and major history of substance abuse  Risk Reduction Factors:   Sense of responsibility to family, Living with another person, especially a relative and Positive social support  Continued Clinical Symptoms:  Severe Anxiety and/or Agitation Depression:   Aggression Comorbid alcohol abuse/dependence Impulsivity  Cognitive Features That Contribute To Risk:  Polarized thinking    Suicide Risk:  Minimal: No identifiable suicidal ideation.  Patients presenting with no risk factors but with morbid ruminations; may be classified as minimal risk based on the severity of the depressive symptoms  Principal Problem: Substance induced mood disorder Discharge Diagnoses:  Patient Active Problem List   Diagnosis Date Noted  . Alcohol use disorder, severe, dependence [F10.20] 10/27/2014    Priority: High  . Substance induced mood disorder [F19.94] 10/27/2014    Priority: High  . MDD (major depressive disorder), recurrent severe, without psychosis [F33.2] 10/27/2014    Priority: High  . Polysubstance abuse [F19.10]       Plan Of Care/Follow-up recommendations:  Activity:  As tolerated Diet:  Heart healthy with low sodium.  Is patient on multiple antipsychotic therapies at discharge:  No  Has Patient had three or more failed trials of antipsychotic monotherapy by history:  No  Recommended Plan for Multiple Antipsychotic Therapies: NA   Beau Fanny, FNP-BC 10/27/2014, 1:54 PM

## 2014-10-27 NOTE — ED Notes (Signed)
Pt alert and cooperative.  Pt recognized by MHT as being another person. Pt asked by charge nurse about name. "I was drunk and out of it". Pt admits to being Adam [name], name changed and chart combined by registration. -SI/HI/A/V/H.  Emotional support and encouragement given. Will continue to monitor closely and evaluate for stabilization.

## 2014-10-28 ENCOUNTER — Encounter (HOSPITAL_COMMUNITY): Payer: Self-pay | Admitting: Psychiatry

## 2014-10-30 ENCOUNTER — Encounter (HOSPITAL_COMMUNITY): Payer: Self-pay | Admitting: Emergency Medicine

## 2014-10-30 ENCOUNTER — Emergency Department (HOSPITAL_COMMUNITY)
Admission: EM | Admit: 2014-10-30 | Discharge: 2014-11-01 | Disposition: A | Discharge status: Home / Self Care | Payer: Federal, State, Local not specified - Other | Attending: Emergency Medicine | Admitting: Emergency Medicine

## 2014-10-30 DIAGNOSIS — F1310 Sedative, hypnotic or anxiolytic abuse, uncomplicated: Secondary | ICD-10-CM | POA: Insufficient documentation

## 2014-10-30 DIAGNOSIS — J189 Pneumonia, unspecified organism: Secondary | ICD-10-CM

## 2014-10-30 DIAGNOSIS — F329 Major depressive disorder, single episode, unspecified: Secondary | ICD-10-CM | POA: Insufficient documentation

## 2014-10-30 DIAGNOSIS — F1410 Cocaine abuse, uncomplicated: Secondary | ICD-10-CM | POA: Insufficient documentation

## 2014-10-30 DIAGNOSIS — Z720 Tobacco use: Secondary | ICD-10-CM | POA: Insufficient documentation

## 2014-10-30 DIAGNOSIS — F419 Anxiety disorder, unspecified: Secondary | ICD-10-CM | POA: Insufficient documentation

## 2014-10-30 DIAGNOSIS — F1910 Other psychoactive substance abuse, uncomplicated: Secondary | ICD-10-CM

## 2014-10-30 DIAGNOSIS — J159 Unspecified bacterial pneumonia: Secondary | ICD-10-CM | POA: Insufficient documentation

## 2014-10-30 DIAGNOSIS — Z87828 Personal history of other (healed) physical injury and trauma: Secondary | ICD-10-CM | POA: Insufficient documentation

## 2014-10-30 DIAGNOSIS — R45851 Suicidal ideations: Secondary | ICD-10-CM

## 2014-10-30 LAB — CBC
HEMATOCRIT: 42.2 % (ref 39.0–52.0)
Hemoglobin: 14.9 g/dL (ref 13.0–17.0)
MCH: 30.2 pg (ref 26.0–34.0)
MCHC: 35.3 g/dL (ref 30.0–36.0)
MCV: 85.4 fL (ref 78.0–100.0)
Platelets: 207 10*3/uL (ref 150–400)
RBC: 4.94 MIL/uL (ref 4.22–5.81)
RDW: 15.2 % (ref 11.5–15.5)
WBC: 15 10*3/uL — AB (ref 4.0–10.5)

## 2014-10-30 LAB — I-STAT TROPONIN, ED: TROPONIN I, POC: 0 ng/mL (ref 0.00–0.08)

## 2014-10-30 MED ORDER — SODIUM CHLORIDE 0.9 % IV BOLUS (SEPSIS)
1000.0000 mL | Freq: Once | INTRAVENOUS | Status: AC
  Administered 2014-10-31: 1000 mL via INTRAVENOUS

## 2014-10-30 MED ORDER — TRAZODONE HCL 50 MG PO TABS
50.0000 mg | ORAL_TABLET | Freq: Every evening | ORAL | Status: DC | PRN
  Administered 2014-10-31: 50 mg via ORAL
  Filled 2014-10-30: qty 1

## 2014-10-30 MED ORDER — LORAZEPAM 2 MG/ML IJ SOLN
1.0000 mg | Freq: Once | INTRAMUSCULAR | Status: AC
  Administered 2014-10-31: 1 mg via INTRAVENOUS
  Filled 2014-10-30: qty 1
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
## 2014-10-31 ENCOUNTER — Emergency Department (HOSPITAL_COMMUNITY): Payer: Self-pay

## 2014-10-31 LAB — DIFFERENTIAL
BASOS ABS: 0 10*3/uL (ref 0.0–0.1)
Basophils Relative: 0 % (ref 0–1)
EOS ABS: 0.1 10*3/uL (ref 0.0–0.7)
Eosinophils Relative: 0 % (ref 0–5)
LYMPHS ABS: 2.4 10*3/uL (ref 0.7–4.0)
Lymphocytes Relative: 17 % (ref 12–46)
MONO ABS: 1.6 10*3/uL — AB (ref 0.1–1.0)
MONOS PCT: 11 % (ref 3–12)
NEUTROS ABS: 10.5 10*3/uL — AB (ref 1.7–7.7)
Neutrophils Relative %: 72 % (ref 43–77)

## 2014-10-31 LAB — COMPREHENSIVE METABOLIC PANEL
ALBUMIN: 3.9 g/dL (ref 3.5–5.0)
ALT: 31 U/L (ref 17–63)
ANION GAP: 11 (ref 5–15)
AST: 29 U/L (ref 15–41)
Alkaline Phosphatase: 63 U/L (ref 38–126)
BILIRUBIN TOTAL: 0.8 mg/dL (ref 0.3–1.2)
BUN: 7 mg/dL (ref 6–20)
CO2: 26 mmol/L (ref 22–32)
Calcium: 8.8 mg/dL — ABNORMAL LOW (ref 8.9–10.3)
Chloride: 105 mmol/L (ref 101–111)
Creatinine, Ser: 1.19 mg/dL (ref 0.61–1.24)
GFR calc Af Amer: >60 mL/min (ref >60)
Glucose, Bld: 93 mg/dL (ref 65–99)
POTASSIUM: 3.8 mmol/L (ref 3.5–5.1)
Sodium: 142 mmol/L (ref 135–145)
TOTAL PROTEIN: 7.5 g/dL (ref 6.5–8.1)

## 2014-10-31 LAB — RAPID URINE DRUG SCREEN, HOSP PERFORMED
AMPHETAMINES: NOT DETECTED
BARBITURATES: NOT DETECTED
BENZODIAZEPINES: POSITIVE — AB
COCAINE: POSITIVE — AB
Opiates: NOT DETECTED
Tetrahydrocannabinol: NOT DETECTED

## 2014-10-31 LAB — ETHANOL: Alcohol, Ethyl (B): 144 mg/dL — ABNORMAL HIGH (ref <5)

## 2014-10-31 LAB — SALICYLATE LEVEL: Salicylate Lvl: <4 mg/dL (ref 2.8–30.0)

## 2014-10-31 LAB — ACETAMINOPHEN LEVEL

## 2014-10-31 MED ORDER — LORAZEPAM 2 MG/ML IJ SOLN: 1.0000 mg | Freq: Four times a day (QID) | INTRAMUSCULAR | Status: DC | PRN

## 2014-10-31 MED ORDER — LORAZEPAM 1 MG PO TABS: 0.0000 mg | ORAL_TABLET | Freq: Four times a day (QID) | ORAL | Status: DC

## 2014-10-31 MED ORDER — FOLIC ACID 1 MG PO TABS
1.0000 mg | ORAL_TABLET | Freq: Every day | ORAL | Status: DC
  Administered 2014-10-31 – 2014-11-01 (×2): 1 mg via ORAL
  Filled 2014-10-31 (×2): qty 1

## 2014-10-31 MED ORDER — LORAZEPAM 1 MG PO TABS
1.0000 mg | ORAL_TABLET | Freq: Four times a day (QID) | ORAL | Status: DC | PRN
Start: 2014-10-31 — End: 2014-11-01

## 2014-10-31 MED ORDER — LEVOFLOXACIN 500 MG PO TABS
500.0000 mg | ORAL_TABLET | Freq: Every day | ORAL | Status: DC
Start: 2014-10-31 — End: 2014-11-01
  Administered 2014-10-31 – 2014-11-01 (×2): 500 mg via ORAL
  Filled 2014-10-31 (×2): qty 1

## 2014-10-31 MED ORDER — ADULT MULTIVITAMIN W/MINERALS CH
1.0000 | ORAL_TABLET | Freq: Every day | ORAL | Status: DC
  Administered 2014-10-31 – 2014-11-01 (×2): 1 via ORAL
  Filled 2014-10-31 (×2): qty 1

## 2014-10-31 MED ORDER — THIAMINE HCL 100 MG/ML IJ SOLN: 100.0000 mg | Freq: Every day | INTRAMUSCULAR | Status: DC

## 2014-10-31 MED ORDER — LORAZEPAM 1 MG PO TABS: 0.0000 mg | ORAL_TABLET | Freq: Two times a day (BID) | ORAL | Status: DC

## 2014-10-31 MED ORDER — VITAMIN B-1 100 MG PO TABS
100.0000 mg | ORAL_TABLET | Freq: Every day | ORAL | Status: DC
  Administered 2014-10-31 – 2014-11-01 (×2): 100 mg via ORAL
  Filled 2014-10-31 (×2): qty 1

## 2014-10-31 NOTE — Progress Notes (Signed)
Seeking inpatient psych placement for pt.  Referred to: [facility]- per Renea Ee [facility]- per Victorino Dike  Both state referrals should be reviewed in the afternoon [facility]- on waitlist per [facility]- will review per Dahlia Client  At capacity for appropriate units: [facility] (will only be able to take referrals for females on adult unit until further notice) [facility]  Left voicemail for [facility] and will refer as appropriate.  Ilean Skill, MSW, LCSW Clinical Social Work, Disposition  10/31/2014 [phone]
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
## 2014-10-31 NOTE — BH Assessment (Signed)
Reviewed ED notes prior to initiating assessment. Per ED notes pt reports he has been abusing crack and having thoughts of shooting himself.   Requested cart be placed with pt for assessment.    Assessment to commence shortly.    Clista Bernhardt, [facility] Triage Specialist 10/31/2014 4:04 AM

## 2014-10-31 NOTE — ED Notes (Signed)
Attempted TTS, unable to wake patient at this time.  TTS notified and will attempt later in the night.

## 2014-10-31 NOTE — ED Notes (Signed)
Pt doing TTS at this time. 

## 2014-10-31 NOTE — BH Assessment (Signed)
No current [facility] beds available. TTS to seek placement. Sent referrals to: [facility], [facility], [facility]    Clista Bernhardt, [place] Triage Specialist 10/31/2014 4:36 AM

## 2014-10-31 NOTE — ED Notes (Signed)
Primary RN made aware [facility] will seek placement for pt.

## 2014-11-01 DIAGNOSIS — R45851 Suicidal ideations: Secondary | ICD-10-CM | POA: Insufficient documentation

## 2014-11-01 DIAGNOSIS — F1910 Other psychoactive substance abuse, uncomplicated: Secondary | ICD-10-CM

## 2014-11-01 NOTE — ED Notes (Signed)
Patient given Bus passess.

## 2014-11-01 NOTE — ED Notes (Signed)
Spoke with [facility] - pt will not be receiving bus ticket to [facility]. Explained pt needs to call for a ride and [facility] can provide bus pass as far as pass will take him towards [facility]. Pt will not leave and is cussing at this RN. Pt requesting 2 bus passes. Explained there must have been a miscommunication, that pt can have 1 bus pass and pt keeps stating "I got lied to - this is crazy."
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
## 2014-11-01 NOTE — Progress Notes (Signed)
Per Fumi at [facility], pt is accepted and will have bed Monday 11/04/14. Spoke with [facility], pt is agreeable to admission. Will need to arrive by 11am on Monday morning, 11/04/14, to [address], [place], [place] [address]. Will be provided bus pass, however SW unable to provide transportation to [facility], therefore pt to contact family in [place] for additional transportation needs.  Notified Fumi at [facility] that pt has agreed to accept admission offer. Fumi states she will fax list of approved items pt can bring to facility--when received, CSW will fax to [facility] to provide to pt.   Ilean Skill, MSW, LCSW Clinical Social Work, Disposition  11/01/2014 [phone]

## 2014-11-01 NOTE — Progress Notes (Signed)
Spoke with Dr. Lucianne Muss, who has evaluated pt today and advises he does not meet inpatient crisis psych placement. Recommends he be d/c with referral to [facility] to meet SA tx needs.  CSW made referral to [facility] with [facility] authorization [id number]. Pt will need to follow up with [facility]. [phone], [phone].  Also called [facility] to attempt referring [facility] but [facility]. Pt has no detox needs at this time.  Spoke with [facility] RN disposition for pt- [facility] SW to provide bus pass to facilitate pt's transportation upon d/c. No barriers to d/c at this time, CSW available to assist as needed.  Ilean Skill, MSW, LCSW Clinical Social Work, Disposition  11/01/2014 [phone]

## 2014-11-01 NOTE — Discharge Instructions (Signed)
If you were given medicines take as directed.  If you are on coumadin or contraceptives realize their levels and effectiveness is altered by many different medicines.  If you have any reaction (rash, tongues swelling, other) to the medicines stop taking and see a physician.    If your blood pressure was elevated in the ER make sure you follow up for management with a primary doctor or return for chest pain, shortness of breath or stroke symptoms.  Please follow up as directed and return to the ER or see a physician for new or worsening symptoms.  Thank you. Filed Vitals:   11/01/14 0138 11/01/14 0619 11/01/14 0719 11/01/14 1153  BP: 150/88 109/68 109/68 125/65  Pulse: 80 65 65 69  Temp:  98.4 F (36.9 C)  98.4 F (36.9 C)  TempSrc:  Oral  Oral  Resp:  20  18  SpO2:  99%  100%

## 2014-11-01 NOTE — ED Notes (Signed)
Patient refused to allow nurse to obtained vitals.

## 2014-11-01 NOTE — Discharge Planning (Signed)
Pt awaiting bus ticket to [place], [place].  NCM contacted Pollyann Savoy, SW who will take care of this matter.

## 2014-11-01 NOTE — ED Notes (Signed)
Patient signed discharge. Paper work and belongings given. Vitals updated. Patient waiting for SW to get pass to go to [facility]. Patient discharged at this time.

## 2014-11-02 ENCOUNTER — Encounter (HOSPITAL_COMMUNITY): Payer: Self-pay | Admitting: *Deleted

## 2014-11-02 ENCOUNTER — Emergency Department (HOSPITAL_COMMUNITY)
Admission: EM | Admit: 2014-11-02 | Discharge: 2014-11-04 | Disposition: A | Discharge status: Home / Self Care | Payer: Self-pay | Attending: Emergency Medicine | Admitting: Emergency Medicine

## 2014-11-02 DIAGNOSIS — F329 Major depressive disorder, single episode, unspecified: Secondary | ICD-10-CM | POA: Insufficient documentation

## 2014-11-02 DIAGNOSIS — Z720 Tobacco use: Secondary | ICD-10-CM | POA: Insufficient documentation

## 2014-11-02 DIAGNOSIS — F419 Anxiety disorder, unspecified: Secondary | ICD-10-CM | POA: Insufficient documentation

## 2014-11-02 DIAGNOSIS — R45851 Suicidal ideations: Secondary | ICD-10-CM

## 2014-11-02 DIAGNOSIS — J189 Pneumonia, unspecified organism: Secondary | ICD-10-CM

## 2014-11-02 DIAGNOSIS — F1310 Sedative, hypnotic or anxiolytic abuse, uncomplicated: Secondary | ICD-10-CM | POA: Insufficient documentation

## 2014-11-02 DIAGNOSIS — F1410 Cocaine abuse, uncomplicated: Secondary | ICD-10-CM | POA: Insufficient documentation

## 2014-11-02 DIAGNOSIS — J159 Unspecified bacterial pneumonia: Secondary | ICD-10-CM | POA: Insufficient documentation

## 2014-11-02 LAB — CBC WITH DIFFERENTIAL/PLATELET
Basophils Absolute: 0 10*3/uL (ref 0.0–0.1)
Basophils Relative: 0 % (ref 0–1)
EOS ABS: 0 10*3/uL (ref 0.0–0.7)
EOS PCT: 0 % (ref 0–5)
HCT: 40.5 % (ref 39.0–52.0)
Hemoglobin: 14.4 g/dL (ref 13.0–17.0)
LYMPHS ABS: 2.3 10*3/uL (ref 0.7–4.0)
LYMPHS PCT: 19 % (ref 12–46)
MCH: 30.1 pg (ref 26.0–34.0)
MCHC: 35.6 g/dL (ref 30.0–36.0)
MCV: 84.7 fL (ref 78.0–100.0)
MONO ABS: 0.8 10*3/uL (ref 0.1–1.0)
Monocytes Relative: 7 % (ref 3–12)
Neutro Abs: 9 10*3/uL — ABNORMAL HIGH (ref 1.7–7.7)
Neutrophils Relative %: 74 % (ref 43–77)
PLATELETS: 218 10*3/uL (ref 150–400)
RBC: 4.78 MIL/uL (ref 4.22–5.81)
RDW: 14.6 % (ref 11.5–15.5)
WBC: 12.1 10*3/uL — ABNORMAL HIGH (ref 4.0–10.5)

## 2014-11-02 LAB — COMPREHENSIVE METABOLIC PANEL
ALT: 24 U/L (ref 17–63)
ANION GAP: 15 (ref 5–15)
AST: 26 U/L (ref 15–41)
Albumin: 3.7 g/dL (ref 3.5–5.0)
Alkaline Phosphatase: 67 U/L (ref 38–126)
BUN: 9 mg/dL (ref 6–20)
CHLORIDE: 101 mmol/L (ref 101–111)
CO2: 22 mmol/L (ref 22–32)
CREATININE: 0.96 mg/dL (ref 0.61–1.24)
Calcium: 8.9 mg/dL (ref 8.9–10.3)
Glucose, Bld: 72 mg/dL (ref 65–99)
POTASSIUM: 4 mmol/L (ref 3.5–5.1)
Sodium: 138 mmol/L (ref 135–145)
Total Bilirubin: 1 mg/dL (ref 0.3–1.2)
Total Protein: 7.2 g/dL (ref 6.5–8.1)

## 2014-11-02 LAB — I-STAT TROPONIN, ED: TROPONIN I, POC: 0 ng/mL (ref 0.00–0.08)

## 2014-11-02 LAB — RAPID URINE DRUG SCREEN, HOSP PERFORMED
Amphetamines: NOT DETECTED
BARBITURATES: NOT DETECTED
Benzodiazepines: POSITIVE — AB
COCAINE: POSITIVE — AB
OPIATES: NOT DETECTED
Tetrahydrocannabinol: NOT DETECTED

## 2014-11-02 LAB — ETHANOL: ALCOHOL ETHYL (B): 6 mg/dL — AB (ref <5)

## 2014-11-02 MED ORDER — LEVOFLOXACIN 750 MG PO TABS
750.0000 mg | ORAL_TABLET | Freq: Every day | ORAL | Status: DC
  Administered 2014-11-03 – 2014-11-04 (×2): 750 mg via ORAL
  Filled 2014-11-02 (×2): qty 1

## 2014-11-02 MED ORDER — LEVOFLOXACIN 750 MG PO TABS
750.0000 mg | ORAL_TABLET | Freq: Once | ORAL | Status: AC
  Administered 2014-11-02: 750 mg via ORAL
  Filled 2014-11-02: qty 1

## 2014-11-02 MED ORDER — ACETAMINOPHEN 325 MG PO TABS: 650.0000 mg | ORAL_TABLET | ORAL | Status: DC | PRN

## 2014-11-02 MED ORDER — LORAZEPAM 1 MG PO TABS: 1.0000 mg | ORAL_TABLET | Freq: Three times a day (TID) | ORAL | Status: DC | PRN

## 2014-11-02 MED ORDER — IBUPROFEN 200 MG PO TABS
600.0000 mg | ORAL_TABLET | Freq: Three times a day (TID) | ORAL | Status: DC | PRN
  Filled 2014-11-02 (×2): qty 1

## 2014-11-02 NOTE — BH Assessment (Signed)
[facility] Assessment Progress Note   Called and scheduled pt's tele assessment with this clinician as well as gathered clinical information from EDP Yao.  Casimer Lanius, MS, [facility] Therapeutic Triage Specialist [facility]
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
## 2014-11-02 NOTE — ED Notes (Signed)
Advised [facility] case management that pt attempted to call girlfriend for ride to [facility], girlfriend unable to take him.

## 2014-11-02 NOTE — Progress Notes (Signed)
Pt provided with MATCH letter at the request of MD for remainder of Levaquin Treatment (5 doses). For PNE. MATCH   CM reviewed EPIC notes and chart review information CM spoke with the pt about [facility] MATCH program ($3 co pay for each Rx through [facility] program, does not include refills, 7 day expiration of MATCH letter and choice of pharmacies) Pt agreed to receive assistance from program    Pt is eligible for [facility] MATCH program (unable to find pt listed in PDMI per cardholder name inquiry)  PDMI information entered. MATCH letter completed and provided to pt.   CM updated EDP and ED RN   Pending confirmation of d/c medication list  Yvone Neu, RNCM [phone]

## 2014-11-02 NOTE — ED Notes (Signed)
Pt to room from triage.  Steward Drone RN called for sitter.  Pt put in scrubs and EKG done.

## 2014-11-02 NOTE — ED Notes (Signed)
Called charge, staffing and security.  Sitter on their way.  Security on way to pt room.
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
## 2014-11-02 NOTE — ED Notes (Addendum)
Pt states he was discharged yesterday after being tx for SI and pnx.  States he feels even more suicidal than before and he is still coughing and having chest pain.  Still wearing purple scrubs. Pt states he drank etoh and did cocaine yesterday after discharge.

## 2014-11-02 NOTE — ED Notes (Signed)
Called for sharps container.

## 2014-11-02 NOTE — Progress Notes (Signed)
12:40PM CSW went to speak with patient regarding needs for transportation. Per chart, patient received two bus passes on his last admission. Patient has returned to ED c/o chest pains and coughing. Per chart, patient was discharged yesterday after being tx for SI and pnx. Patient also reported that he drank etoh and did cocaine after discharge.   CSW informed patient that CSW is unable to provided transportation for patient to get to [facility]. Patient currently has been accepted at [facility], and will need to arrive by 11:00am on Monday morning. CSW explored alternatives with the patient. Patient stated, that he could possibly call his ex-girlfriend for transportation. CSW provided outpatient resources to patient. Patient was appreciative of resources provided. No further needs reported at this time. CSW to sign off.   Fernande Boyden, LCSWA Clinical Social Worker [facility] Ph: [phone]
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
## 2014-11-03 NOTE — ED Notes (Signed)
Relieved by Rocky Link, EMT, now sitting with patient; patient laying on stretcher watching television, no needs at this time

## 2014-11-03 NOTE — ED Notes (Signed)
Being relieved by Yvonna Alanis, EMT for lunch break

## 2014-11-03 NOTE — ED Provider Notes (Signed)
  Physical Exam  BP 121/80 mmHg  Pulse 48  Temp(Src) 98 F (36.7 C) (Oral)  Resp 48  SpO2 100%  Physical Exam  ED Course  Procedures  MDM Patient here with suicidal thoughts. Seen by me yesterday. Pending psych placement. Medically stable. On levaquin for pneumonia.   Richardean Canal, MD 11/03/14 [phone]

## 2014-11-03 NOTE — ED Notes (Signed)
Adam [name], NT relieved me for a bathroom break; patient sitting on side of stretcher watching tv and drinking gingerale

## 2014-11-03 NOTE — ED Notes (Signed)
Patient eating his dinner tray at this time

## 2014-11-03 NOTE — ED Notes (Signed)
Sitting with patient now

## 2014-11-03 NOTE — ED Notes (Signed)
Unable to get pt temp due to pt eating snack and drinking cold drink

## 2014-11-03 NOTE — ED Notes (Signed)
Sitter has returned 

## 2014-11-03 NOTE — ED Notes (Signed)
Patient laying on stretcher, watching television, no needs at this time

## 2014-11-03 NOTE — ED Notes (Signed)
Returned from lunch break; patient watching television, eating crackers and drinking gingerale

## 2014-11-03 NOTE — ED Notes (Signed)
Patient laying on stretcher watching television, no needs at this time 

## 2014-11-03 NOTE — ED Notes (Signed)
relieving sitter for bathroom break; patient watching tv, no needs at this time

## 2014-11-03 NOTE — ED Notes (Signed)
Patient laying on stretcher watching tv, no needs at this time

## 2014-11-03 NOTE — ED Notes (Signed)
Patient has finished eating, now laying on stretcher watching television and eating ice chips, no needs at this time

## 2014-11-04 MED ORDER — LEVOFLOXACIN 750 MG PO TABS: 750.0000 mg | ORAL_TABLET | Freq: Every day | ORAL | Status: DC

## 2014-11-04 NOTE — Progress Notes (Signed)
15:00- received call from [facility] at [facility] stating pt arrived and refused admission for treatment, stating "I need to get to my job tomorrow." Was not admitted to [facility].  Ilean Skill, MSW, LCSW Clinical Social Work, Disposition  11/04/2014 [phone]

## 2014-11-04 NOTE — ED Provider Notes (Signed)
29 year old male with history of suicidal thoughts and ideation here with the same. Also has a cough has been diagnosed with pneumonia. Patient has been accepted to a alcohol and drug abuse rehabilitation [facility] in [place] by Dr. Cyril Mourning.  Physical examination reveals clear lung sounds. Normal vitals. Medically cleared and stable for discharge to [facility] with transport by [facility].   Filed Vitals:   11/04/14 1052  BP: 126/84  Pulse: 57  Temp: 97.6 F (36.4 C)  Resp: 18       Marily Memos, MD 11/04/14 1931

## 2014-11-04 NOTE — ED Notes (Signed)
Pt refused ibuprofen at this time.

## 2014-11-04 NOTE — Progress Notes (Signed)
Per Fumi at [facility] 11/01/14, pt should arrive between 8am-11am for admission. Pt voluntary therefore [facility] appropriate for transportation.  Ilean Skill, MSW, LCSW Clinical Social Work, Disposition  11/04/2014 [phone]

## 2014-11-04 NOTE — Progress Notes (Signed)
Spoke with Fumi, [facility] intake RN, who states she received report from RN. States bed is ready for pt when he arrives.   Ilean Skill, MSW, LCSW Clinical Social Work, Disposition  11/04/2014 [phone]

## 2015-05-19 IMAGING — CR DG HAND COMPLETE 3+V*L*
3 series · 3 of 3 positions shown · non-contrast
Comparison: None available

CLINICAL DATA: Pain, 5th metacarpal, question injury

EXAM:
LEFT HAND - COMPLETE 3+ VIEW

[x hand pa left]
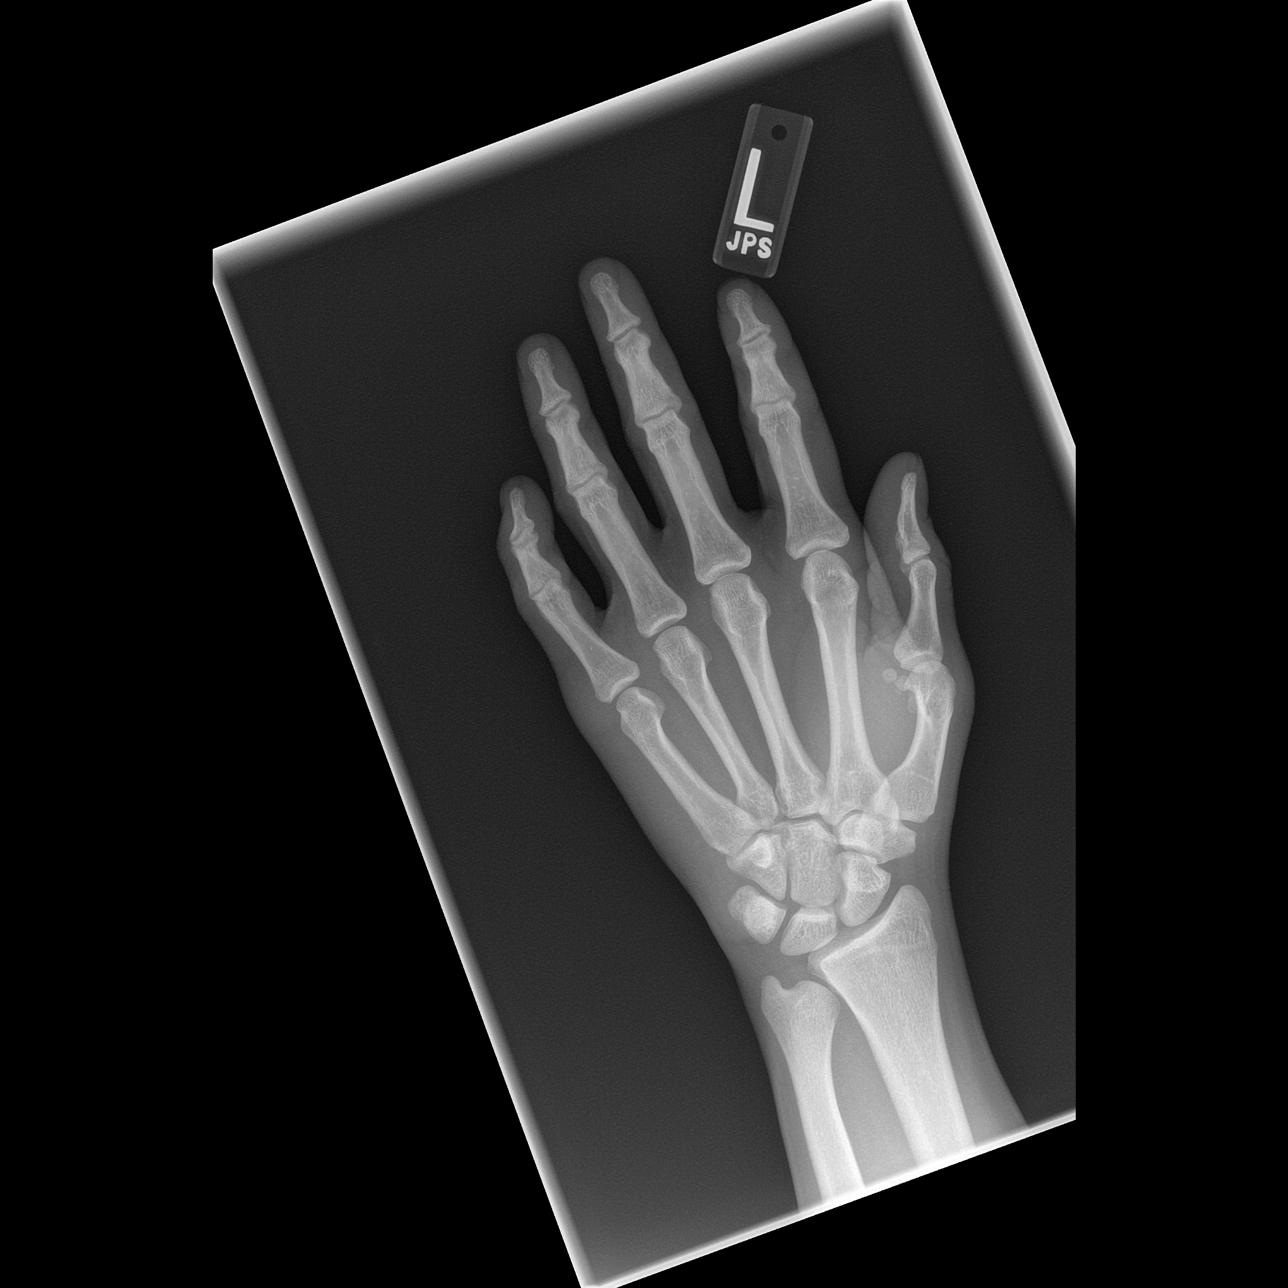

[x hand oblique left]
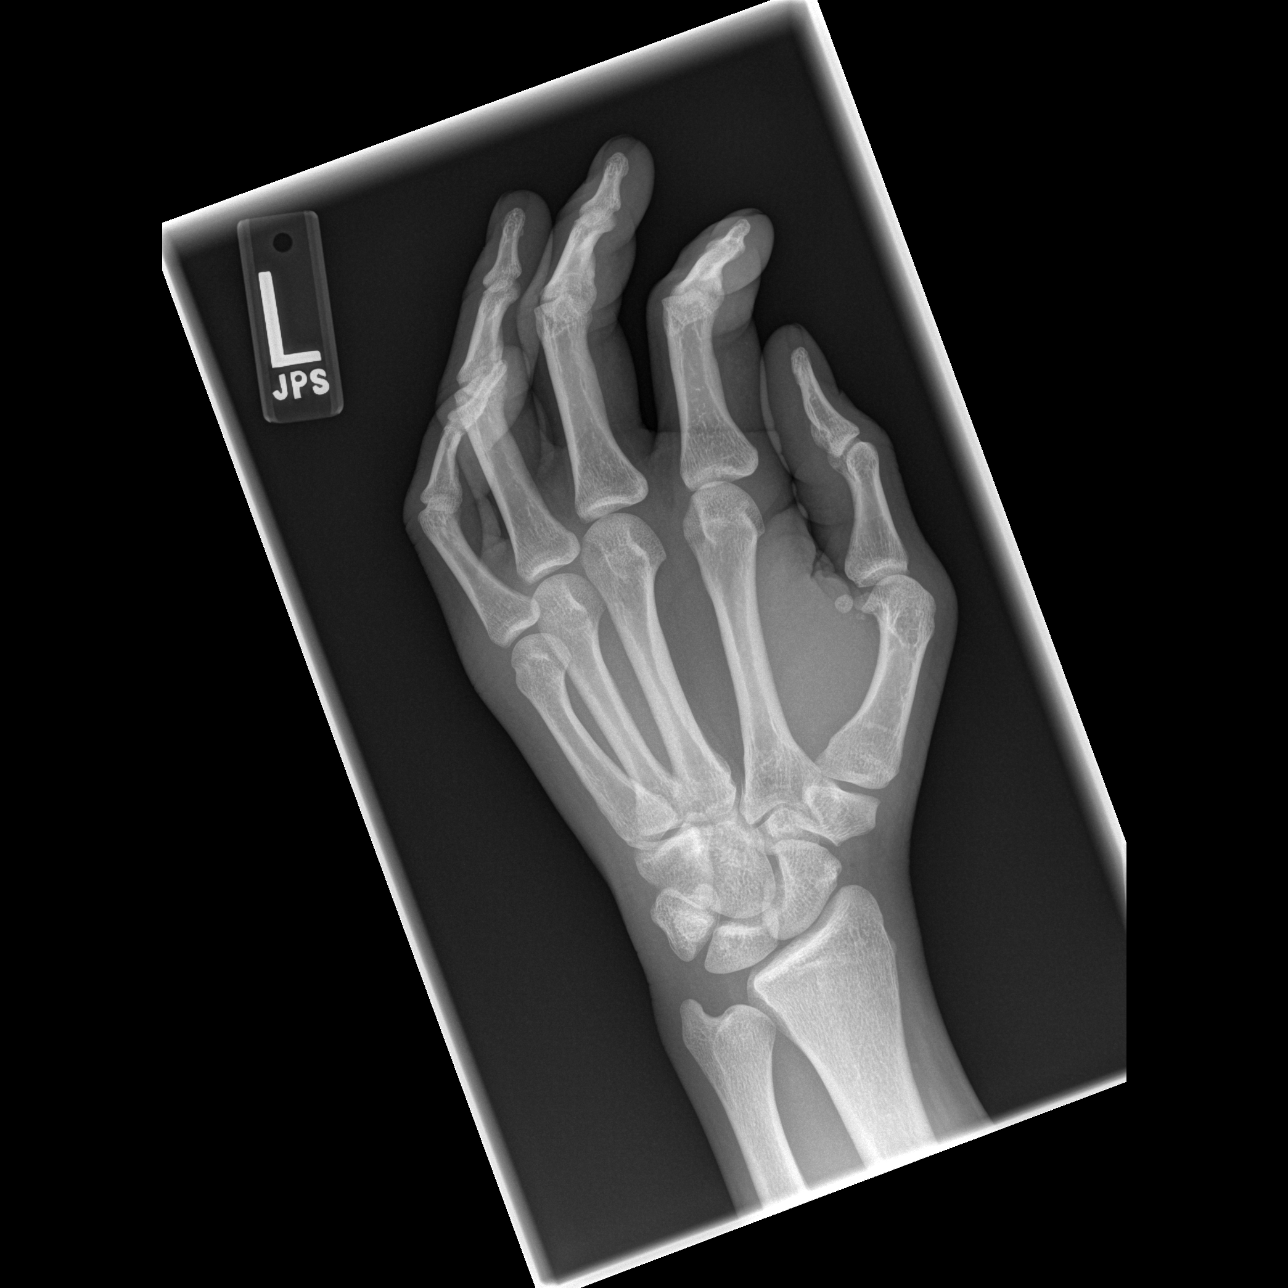

[x hand lat left]
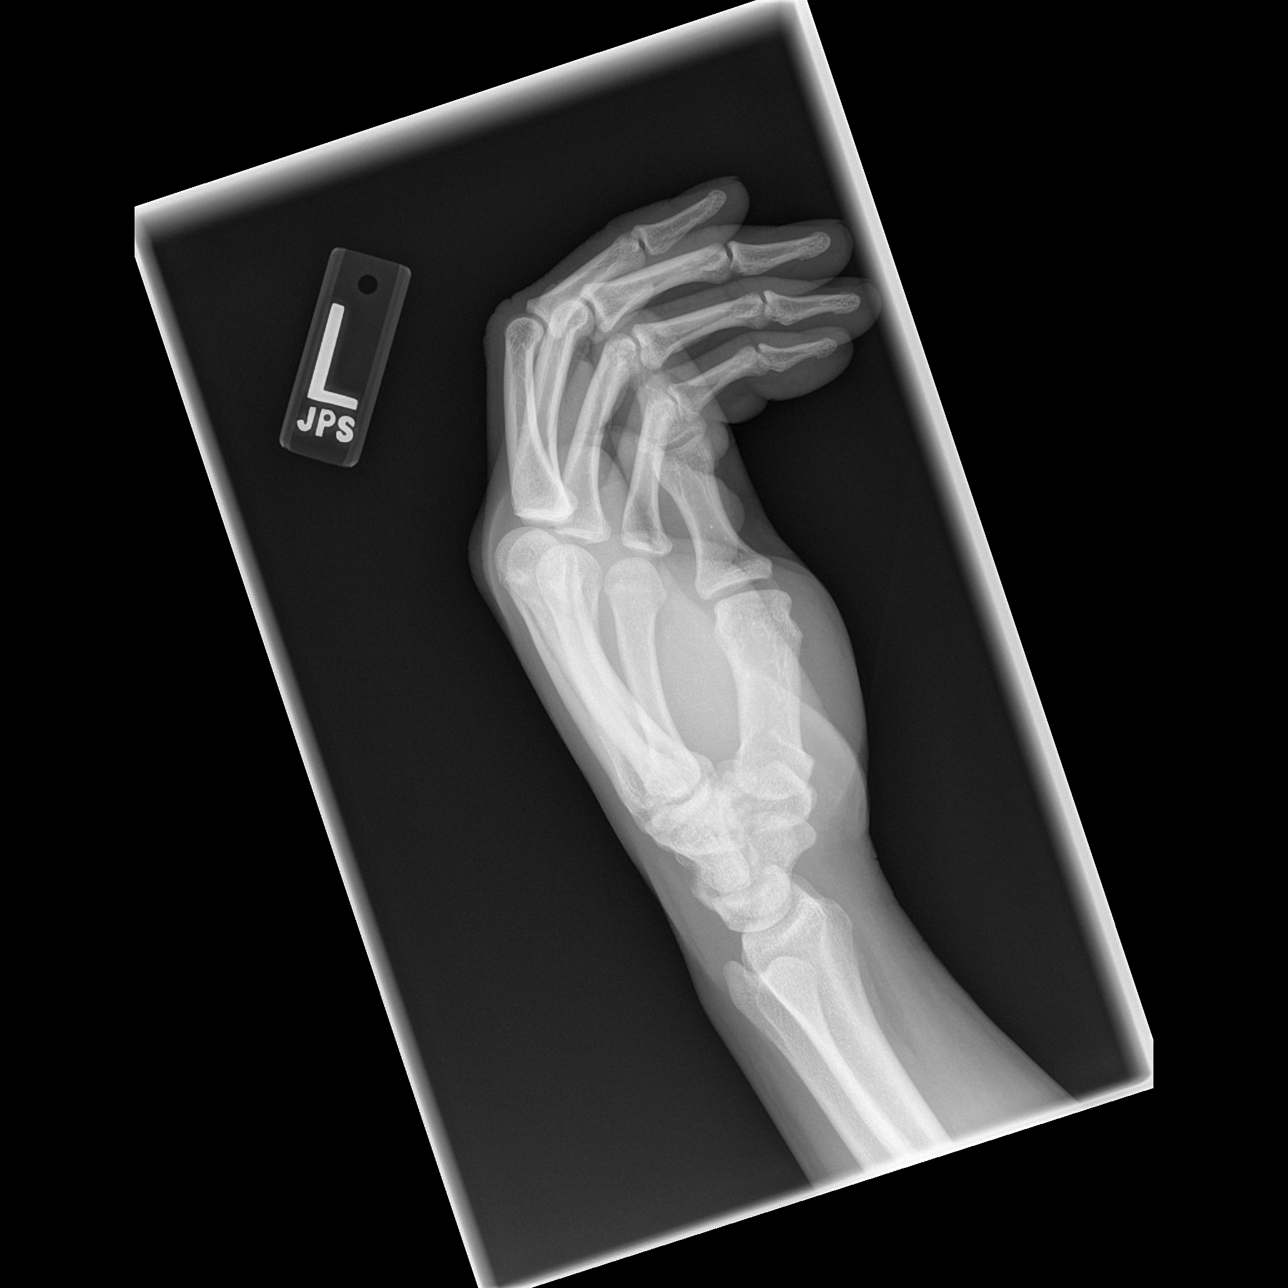

[3 of 3 positions shown; findings below may reference images not displayed]

FINDINGS: There is no evidence of fracture or dislocation. There is no
evidence of arthropathy or other focal bone abnormality. Soft
tissues are unremarkable.
IMPRESSION: No acute fracture or dislocation identified.

## 2015-10-19 ENCOUNTER — Emergency Department (HOSPITAL_COMMUNITY)
Admission: EM | Admit: 2015-10-19 | Discharge: 2015-10-19 | Disposition: A | Discharge status: Other Acute Inpatient Hospital | Payer: Self-pay | Attending: Emergency Medicine | Admitting: Emergency Medicine

## 2015-10-19 ENCOUNTER — Encounter (HOSPITAL_COMMUNITY): Payer: Self-pay | Admitting: Emergency Medicine

## 2015-10-19 ENCOUNTER — Inpatient Hospital Stay (HOSPITAL_COMMUNITY)
Admission: AD | Admit: 2015-10-19 | Discharge: 2015-10-22 | DRG: 885 | Disposition: A | Discharge status: Home / Self Care | Payer: Federal, State, Local not specified - Other | Source: Intra-hospital | Attending: Psychiatry | Admitting: Psychiatry

## 2015-10-19 ENCOUNTER — Encounter (HOSPITAL_COMMUNITY): Payer: Self-pay | Admitting: *Deleted

## 2015-10-19 DIAGNOSIS — F1024 Alcohol dependence with alcohol-induced mood disorder: Secondary | ICD-10-CM | POA: Diagnosis not present

## 2015-10-19 DIAGNOSIS — G4700 Insomnia, unspecified: Secondary | ICD-10-CM | POA: Diagnosis present

## 2015-10-19 DIAGNOSIS — Z590 Homelessness: Secondary | ICD-10-CM | POA: Diagnosis not present

## 2015-10-19 DIAGNOSIS — F1420 Cocaine dependence, uncomplicated: Secondary | ICD-10-CM | POA: Diagnosis present

## 2015-10-19 DIAGNOSIS — I10 Essential (primary) hypertension: Secondary | ICD-10-CM | POA: Diagnosis present

## 2015-10-19 DIAGNOSIS — Z823 Family history of stroke: Secondary | ICD-10-CM | POA: Diagnosis not present

## 2015-10-19 DIAGNOSIS — R45851 Suicidal ideations: Secondary | ICD-10-CM | POA: Diagnosis present

## 2015-10-19 DIAGNOSIS — F333 Major depressive disorder, recurrent, severe with psychotic symptoms: Principal | ICD-10-CM | POA: Diagnosis present

## 2015-10-19 DIAGNOSIS — F17210 Nicotine dependence, cigarettes, uncomplicated: Secondary | ICD-10-CM | POA: Insufficient documentation

## 2015-10-19 DIAGNOSIS — Z79899 Other long term (current) drug therapy: Secondary | ICD-10-CM | POA: Insufficient documentation

## 2015-10-19 DIAGNOSIS — F1020 Alcohol dependence, uncomplicated: Secondary | ICD-10-CM | POA: Diagnosis present

## 2015-10-19 DIAGNOSIS — F419 Anxiety disorder, unspecified: Secondary | ICD-10-CM | POA: Diagnosis present

## 2015-10-19 LAB — RAPID URINE DRUG SCREEN, HOSP PERFORMED
AMPHETAMINES: NOT DETECTED
BENZODIAZEPINES: NOT DETECTED
Barbiturates: NOT DETECTED
Cocaine: POSITIVE — AB
Opiates: NOT DETECTED
TETRAHYDROCANNABINOL: NOT DETECTED

## 2015-10-19 LAB — CBC
HCT: 40.6 % (ref 39.0–52.0)
HEMOGLOBIN: 14.2 g/dL (ref 13.0–17.0)
MCH: 29.6 pg (ref 26.0–34.0)
MCHC: 35 g/dL (ref 30.0–36.0)
MCV: 84.6 fL (ref 78.0–100.0)
Platelets: 227 10*3/uL (ref 150–400)
RBC: 4.8 MIL/uL (ref 4.22–5.81)
RDW: 15.8 % — ABNORMAL HIGH (ref 11.5–15.5)
WBC: 10.8 10*3/uL — ABNORMAL HIGH (ref 4.0–10.5)

## 2015-10-19 LAB — COMPREHENSIVE METABOLIC PANEL
ALBUMIN: 3.8 g/dL (ref 3.5–5.0)
ALT: 36 U/L (ref 17–63)
AST: 58 U/L — AB (ref 15–41)
Alkaline Phosphatase: 70 U/L (ref 38–126)
Anion gap: 11 (ref 5–15)
BUN: 6 mg/dL (ref 6–20)
CHLORIDE: 103 mmol/L (ref 101–111)
CO2: 24 mmol/L (ref 22–32)
CREATININE: 0.92 mg/dL (ref 0.61–1.24)
Calcium: 8.8 mg/dL — ABNORMAL LOW (ref 8.9–10.3)
GFR calc Af Amer: >60 mL/min (ref >60)
GLUCOSE: 123 mg/dL — AB (ref 65–99)
Potassium: 3.9 mmol/L (ref 3.5–5.1)
SODIUM: 138 mmol/L (ref 135–145)
Total Bilirubin: 0.8 mg/dL (ref 0.3–1.2)
Total Protein: 6.7 g/dL (ref 6.5–8.1)

## 2015-10-19 LAB — SALICYLATE LEVEL: Salicylate Lvl: <4 mg/dL (ref 2.8–30.0)

## 2015-10-19 LAB — ETHANOL: Alcohol, Ethyl (B): 27 mg/dL — ABNORMAL HIGH (ref <5)

## 2015-10-19 LAB — ACETAMINOPHEN LEVEL: Acetaminophen (Tylenol), Serum: <10 ug/mL — ABNORMAL LOW (ref 10–30)

## 2015-10-19 MED ORDER — ONDANSETRON HCL 4 MG PO TABS: 4.0000 mg | ORAL_TABLET | Freq: Three times a day (TID) | ORAL | Status: DC | PRN

## 2015-10-19 MED ORDER — LORAZEPAM 1 MG PO TABS
1.0000 mg | ORAL_TABLET | Freq: Four times a day (QID) | ORAL | Status: AC
  Administered 2015-10-20 – 2015-10-21 (×4): 1 mg via ORAL
  Filled 2015-10-19 (×4): qty 1

## 2015-10-19 MED ORDER — HYDROXYZINE HCL 25 MG PO TABS
25.0000 mg | ORAL_TABLET | Freq: Four times a day (QID) | ORAL | Status: DC | PRN
  Administered 2015-10-21: 25 mg via ORAL
  Filled 2015-10-19 (×2): qty 1

## 2015-10-19 MED ORDER — ADULT MULTIVITAMIN W/MINERALS CH
1.0000 | ORAL_TABLET | Freq: Every day | ORAL | Status: DC
  Administered 2015-10-20 – 2015-10-22 (×2): 1 via ORAL
  Filled 2015-10-19 (×6): qty 1

## 2015-10-19 MED ORDER — IBUPROFEN 400 MG PO TABS: 600.0000 mg | ORAL_TABLET | Freq: Three times a day (TID) | ORAL | Status: DC | PRN

## 2015-10-19 MED ORDER — LORATADINE 10 MG PO TABS
10.0000 mg | ORAL_TABLET | Freq: Every day | ORAL | Status: DC
  Administered 2015-10-20 – 2015-10-22 (×2): 10 mg via ORAL
  Filled 2015-10-19: qty 1
  Filled 2015-10-19: qty 7
  Filled 2015-10-19 (×4): qty 1

## 2015-10-19 MED ORDER — LORAZEPAM 1 MG PO TABS: 1.0000 mg | ORAL_TABLET | Freq: Three times a day (TID) | ORAL | Status: DC | PRN

## 2015-10-19 MED ORDER — TRAZODONE HCL 50 MG PO TABS: 50.0000 mg | ORAL_TABLET | Freq: Every evening | ORAL | Status: DC | PRN

## 2015-10-19 MED ORDER — ZOLPIDEM TARTRATE 5 MG PO TABS: 5.0000 mg | ORAL_TABLET | Freq: Every evening | ORAL | Status: DC | PRN

## 2015-10-19 MED ORDER — ONDANSETRON 4 MG PO TBDP: 4.0000 mg | ORAL_TABLET | Freq: Four times a day (QID) | ORAL | Status: DC | PRN

## 2015-10-19 MED ORDER — ACETAMINOPHEN 325 MG PO TABS: 650.0000 mg | ORAL_TABLET | ORAL | Status: DC | PRN

## 2015-10-19 MED ORDER — ALUM & MAG HYDROXIDE-SIMETH 200-200-20 MG/5ML PO SUSP: 30.0000 mL | ORAL | Status: DC | PRN

## 2015-10-19 MED ORDER — LOPERAMIDE HCL 2 MG PO CAPS: 2.0000 mg | ORAL_CAPSULE | ORAL | Status: DC | PRN

## 2015-10-19 MED ORDER — TRAZODONE HCL 50 MG PO TABS
50.0000 mg | ORAL_TABLET | Freq: Every evening | ORAL | Status: DC | PRN
  Administered 2015-10-19: 50 mg via ORAL
  Filled 2015-10-19: qty 1

## 2015-10-19 MED ORDER — LORAZEPAM 1 MG PO TABS
1.0000 mg | ORAL_TABLET | Freq: Three times a day (TID) | ORAL | Status: AC
  Filled 2015-10-19 (×2): qty 1

## 2015-10-19 MED ORDER — LORAZEPAM 1 MG PO TABS
1.0000 mg | ORAL_TABLET | Freq: Four times a day (QID) | ORAL | Status: DC | PRN
  Filled 2015-10-19: qty 1

## 2015-10-19 MED ORDER — LORAZEPAM 1 MG PO TABS: 1.0000 mg | ORAL_TABLET | Freq: Two times a day (BID) | ORAL | Status: DC

## 2015-10-19 MED ORDER — LORAZEPAM 1 MG PO TABS: 1.0000 mg | ORAL_TABLET | Freq: Every day | ORAL | Status: DC

## 2015-10-19 MED ORDER — NICOTINE 21 MG/24HR TD PT24: 21.0000 mg | MEDICATED_PATCH | Freq: Every day | TRANSDERMAL | Status: DC

## 2015-10-19 MED ORDER — ACETAMINOPHEN 325 MG PO TABS: 650.0000 mg | ORAL_TABLET | Freq: Four times a day (QID) | ORAL | Status: DC | PRN

## 2015-10-19 MED ORDER — VITAMIN B-1 100 MG PO TABS
100.0000 mg | ORAL_TABLET | Freq: Every day | ORAL | Status: DC
  Administered 2015-10-20 – 2015-10-22 (×2): 100 mg via ORAL
  Filled 2015-10-19 (×6): qty 1

## 2015-10-19 NOTE — ED Notes (Signed)
Adam [name], [employer], aware pt in B16.

## 2015-10-19 NOTE — Progress Notes (Signed)
Admission note:  Pt is a 30 year old AAM admitted to the services of Dr. Jama Flavorsobos for treatment for substance abuse, depression, and suicidal ideation.  [facility] but must complete detox first.  Pt has been increasing depressed and suicidal with thoughts to walk into traffic.  Pt drinks 8 40s daily as well as abuses crack cocaine.  Pt states that he lives in a "crack house".  Pt denies support at this time.  Pt does contract for safety while on the unit and is cooperative with the admission process.  Pt states that he his allergic to alcohol with symptoms resembling seasonal rhinitis.  Pt states that he has been experiencing some auditory hallucinations but denies that they are command in nature.  Pt oriented to unit, salad provided, Gatorade provided, and shown to room.
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
## 2015-10-19 NOTE — Tx Team (Signed)
Initial Interdisciplinary Treatment Plan   PATIENT STRESSORS: Financial difficulties Substance abuse   PATIENT STRENGTHS: Average or above average intelligence Motivation for treatment/growth   PROBLEM LIST: Problem List/Patient Goals Date to be addressed Date deferred Reason deferred Estimated date of resolution  Substance abuse 10/19/15     Suicidal ideation 10/19/15     depression 10/19/15     "Getting into [facility], I have a bed"                                     DISCHARGE CRITERIA:  Adequate post-discharge living arrangements Improved stabilization in mood, thinking, and/or behavior Motivation to continue treatment in a less acute level of care Need for constant or close observation no longer present Verbal commitment to aftercare and medication compliance Withdrawal symptoms are absent or subacute and managed without 24-hour nursing intervention  PRELIMINARY DISCHARGE PLAN: Attend 12-step recovery group Outpatient therapy Placement in alternative living arrangements  PATIENT/FAMIILY INVOLVEMENT: This treatment plan has been presented to and reviewed with the patient, Adam [name].  The patient and family have been given the opportunity to ask questions and make suggestions.  Juliann ParesBowman, Yunis Voorheis Elizabeth 10/19/2015, 11:52 PM
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
## 2015-10-19 NOTE — [facility] Counselor (Signed)
[facility]  May be admitted after 2130.  Admitting is T. Starkes, NP.  Attending is Dr. Jama Flavorsobos.

## 2015-10-20 DIAGNOSIS — F1024 Alcohol dependence with alcohol-induced mood disorder: Secondary | ICD-10-CM

## 2015-10-20 MED ORDER — TRAZODONE HCL 100 MG PO TABS
100.0000 mg | ORAL_TABLET | Freq: Every evening | ORAL | Status: DC | PRN
  Administered 2015-10-21 (×2): 100 mg via ORAL
  Filled 2015-10-20: qty 7
  Filled 2015-10-20 (×2): qty 1

## 2015-10-20 NOTE — [facility] Group Notes (Signed)
[facility] LCSW Group Therapy  10/20/2015 1:15pm  Type of Therapy:  Group Therapy vercoming Obstacles  Pt did not attend, declined invitation.   Chad CordialLauren Carter, LCSWA 10/20/2015 3:40 PM

## 2015-10-20 NOTE — Progress Notes (Signed)
D: Patient denies SI/HI and A/V hallucinations   A: Monitored q 15 minutes; patient encouraged to attend groups; patient educated about medications; patient given medications per physician orders; patient encouraged to express feelings and/or concerns  R: Patient has been in the bed all morning sleeping; during interaction the patient is agitated but cooperative; patient is taking all medications as prescribed; patient has not attended any groups

## 2015-10-20 NOTE — Tx Team (Signed)
Interdisciplinary Treatment Plan Update (Adult) Date: 10/20/2015   Date: 10/20/2015 10:18 AM  Progress in Treatment:  Attending groups: Pt is new to milieu, continuing to assess  Participating in groups: Pt is new to milieu, continuing to assess  Taking medication as prescribed: Yes  Tolerating medication: Yes  Family/Significant othe contact made: No, CSW assessing for appropriate contact Patient understands diagnosis: Continuing to assess Discussing patient identified problems/goals with staff: Yes  Medical problems stabilized or resolved: Yes  Denies suicidal/homicidal ideation: Admitted with passive SI Patient has not harmed self or Others: Yes   New problem(s) identified: None identified at this time.   Discharge Plan or Barriers: CSW will assess for appropriate discharge plan and relevant barriers.   Additional comments:  Patient and CSW reviewed pt's identified goals and treatment plan. Patient verbalized understanding and agreed to treatment plan.   Reason for Continuation of Hospitalization:  Depression Medication stabilization Suicidal ideation Withdrawal symptom  Estimated length of stay: 3-5 days  Review of initial/current patient goals per problem list:   1.  Goal(s): Patient will participate in aftercare plan  Met:  No  Target date: 3-5 days from date of admission   As evidenced by: Patient will participate within aftercare plan AEB aftercare provider and housing plan at discharge being identified.  10/20/15: CSW to work with Pt to assess for appropriate discharge plan and faciliate appointments and referrals as needed prior to d/c.  2.  Goal (s): Patient will exhibit decreased depressive symptoms and suicidal ideations.  Met:  No  Target date: 3-5 days from date of admission   As evidenced by: Patient will utilize self rating of depression at 3 or below and demonstrate decreased signs of depression or be deemed stable for discharge by MD. 10/20/15: Pt was  admitted with symptoms of depression, rating 10/10. Pt continues to present with flat affect and depressive symptoms.  Pt will demonstrate decreased symptoms of depression and rate depression at 3/10 or lower prior to discharge.  3.  Goal(s): Patient will demonstrate decreased signs and symptoms of anxiety.  Met:  No  Target date: 3-5 days from date of admission   As evidenced by: Patient will utilize self rating of anxiety at 3 or below and demonstrated decreased signs of anxiety, or be deemed stable for discharge by MD 10/20/15: Pt was admitted with increased levels of anxiety and is currently rating those symptoms highly. Pt will demonstrated decreased symptoms of anxiety and rate it at 3/10 prior to d/c. 4.  Goal(s): Patient will demonstrate decreased signs of withdrawal due to substance abuse  Met:  No  Target date: 3-5 days from date of admission   As evidenced by: Patient will produce a CIWA/COWS score of 0, have stable vitals signs, and no symptoms of withdrawal  10/20/15: CIWA score of 3; endorses anxiety and agitation as symptoms of anxiety.  Attendees:  Patient:    Family:    Physician: Dr. Parke Poisson, MD  10/20/2015 10:18 AM  Nursing: Marilynne Halsted, RN 10/20/2015 10:18 AM  Clinical Social Worker Peri Maris, [place] 10/20/2015 10:18 AM  Other: Erasmo Downer Drinkard, LCSWA 10/20/2015 10:18 AM  Clinical: Lars Pinks, RN Case manager  10/20/2015 10:18 AM  Other:  10/20/2015 10:18 AM  Other:     Peri Maris, [place] Social Work [phone]

## 2015-10-20 NOTE — [facility] Suicide Risk Assessment (Signed)
[facility]   Nursing information obtained from:   patient and chart  Demographic factors:   30 year old single male  Current Mental Status:   see below  Loss Factors:   lack of support, relapse, parents medically ill  Historical Factors:   history of alcohol and cocaine dependencies  Risk Reduction Factors:   resilience, desire for improvement   Total Time spent with patient: 45 minutes Principal Problem:  Alcohol and Cocaine Dependencies, Substance Induced Mood Disorder  Diagnosis:   Patient Active Problem List   Diagnosis Date Noted  . MDD (major depressive disorder), recurrent, severe, with psychosis (HCC) [F33.3] 10/19/2015  . Suicidal ideation [R45.851]   . Alcohol use disorder, severe, dependence (HCC) [F10.20] 10/27/2014  . Substance induced mood disorder (HCC) [F19.94] 10/27/2014  . MDD (major depressive disorder), recurrent severe, without psychosis (HCC) [F33.2] 10/27/2014  . Polysubstance abuse [F19.10]   . Alcohol use disorder, severe, dependence (HCC) [F10.20] 10/22/2014  . Polysubstance abuse [F19.10] 05/18/2014  . Alcohol dependence with alcohol-induced mood disorder (HCC) [F10.24] 05/18/2014  . Alcoholic ketoacidosis [E87.2] 16/10/960402/18/2015  . Abdominal pain [R10.9] 05/02/2013  . MDD (major depressive disorder) (HCC) [F32.9] 04/11/2013  . Alcohol intoxication in active alcoholic (HCC) [F10.229] 02/24/2013  . Alcohol dependence (HCC) [F10.20] 06/01/2012  . Cocaine abuse [F14.10] 06/01/2012  . Substance induced mood disorder [facility](HCC) [F19.94] 06/01/2012     Continued Clinical Symptoms:  Alcohol Use Disorder Identification Test Final Score (AUDIT): 23 The "Alcohol Use Disorders Identification Test", Guidelines for Use in Primary Care, Second Edition.  [employer] [facility](WHO). Score between 0-7:  no or low risk or alcohol related problems. Score between 8-15:  moderate risk of alcohol related problems. Score between 16-19:  high risk of  alcohol related problems. Score 20 or above:  warrants further diagnostic evaluation for alcohol dependence and treatment.   CLINICAL FACTORS:  30 year old single male, history of alcohol and cocaine dependencies, has been drinking heavily and daily , and using crack cocaine on most days, presented to hospital of own accord reporting worsening depression, suicidal ideations, desire to detox     Psychiatric Specialty Exam: Physical Exam  ROS  Blood pressure 130/80, pulse 87, temperature 98.4 F (36.9 C), temperature source Oral, resp. rate 18, height 5\' 11"  (1.803 m), weight 197 lb (89.4 kg), SpO2 97 %.Body mass index is 27.48 kg/m.   see admit note MSE    COGNITIVE FEATURES THAT CONTRIBUTE TO RISK:  Closed-mindedness and Loss of executive function    SUICIDE RISK:   Moderate:  Frequent suicidal ideation with limited intensity, and duration, some specificity in terms of plans, no associated intent, good self-control, limited dysphoria/symptomatology, some risk factors present, and identifiable protective factors, including available and accessible social support.   PLAN OF CARE: Patient will be admitted to inpatient psychiatric unit for stabilization and safety. Will provide and encourage milieu participation. Provide medication management and maked adjustments as needed.  Will also provide medication management to minimize risk of WDL . Will follow daily.    I certify that inpatient services furnished can reasonably be expected to improve the patient's condition.  Nehemiah MassedOBOS, Tarry Blayney, MD 10/20/2015, 5:57 PM

## 2015-10-20 NOTE — Progress Notes (Signed)
Adult Psychoeducational Group Note  Date:  10/20/2015 Time:  8:45 PM  Group Topic/Focus:  Wrap-Up Group:   The focus of this group is to help patients review their daily goal of treatment and discuss progress on daily workbooks.   Participation Level:  Did Not Attend  Pt was asleep during wrap-up group.    Cleotilde NeerJasmine S Wilene Pharo 10/20/2015, 9:36 PM

## 2015-10-20 NOTE — [facility] Group Notes (Signed)
[facility]  10/20/2015 8:45 AM  Pt did not attend, declined invitation.   Chad CordialLauren Carter, LCSWA 10/20/2015 10:18 AM
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
## 2015-10-21 NOTE — Progress Notes (Signed)
Patient ID: Adam [name], male   DOB: 06/29/1985, 30 y.o.   MRN: [id number] D: Patient has lain in bed all day.  He has refused all medications.  Patient rates his depression, hopelessness and anxiety as a 4.  He denies any continuing thoughts of self harm.  He denies HI/AVH.  His goal today is to "be positive."  Patient is sleeping well.  He reports some irritability due to withdrawal symptoms.  Patient has refused any medications on his ativan protocol.  Patient has flat, blunted affect; poor eye contact and minimal interaction with staff. A: Continue to monitor medication management and MD orders.  Safety checks completed every 15 minutes per protocol.  Offer support and encouragement as needed. R: Patient has minimal interaction with staff and peers.  He is not attending groups and isolates to his room.

## 2015-10-21 NOTE — Progress Notes (Signed)
Adult Psychoeducational Group Note  Date:  10/21/2015 Time:  8:45 PM  Group Topic/Focus:  Wrap-Up Group:   The focus of this group is to help patients review their daily goal of treatment and discuss progress on daily workbooks.   Participation Level:  Active  Participation Quality:  Appropriate  Affect:  Appropriate  Cognitive:  Appropriate  Insight: Good  Engagement in Group:  Engaged  Modes of Intervention:  Discussion  Additional Comments:  Pt rated his overall day a 20 out of 10 and stated that his day was "awesome" because of positive communication with other patients on the unit throughout the day and he is getting discharged tomorrow. Pt reported that he achieved his goal for the day, which was "to be positive".    Cleotilde NeerJasmine S Betsey Sossamon 10/21/2015, 9:48 PM

## 2015-10-21 NOTE — [facility] Group Notes (Signed)
[facility] LCSW Group Therapy 10/21/2015 1:15 PM  Type of Therapy: Group Therapy- Feelings about Diagnosis  Participation Level: Active   Participation Quality:  Appropriate  Affect:  Appropriate  Cognitive: Alert and Oriented   Insight:  Developing   Engagement in Therapy: Developing/Improving and Engaged   Modes of Intervention: Clarification, Confrontation, Discussion, Education, Exploration, Limit-setting, Orientation, Problem-solving, Rapport Building, Dance movement psychotherapisteality Testing, Socialization and Support  Description of Group:   This group will allow patients to explore their thoughts and feelings about diagnoses they have received. Patients will be guided to explore their level of understanding and acceptance of these diagnoses. Facilitator will encourage patients to process their thoughts and feelings about the reactions of others to their diagnosis, and will guide patients in identifying ways to discuss their diagnosis with significant others in their lives. This group will be process-oriented, with patients participating in exploration of their own experiences as well as giving and receiving support and challenge from other group members.  Summary of Progress/Problems:  Pt was active in group discussion. He identified his diagnosis as addiction and discussed his motivation to live a more positive life. Pt offered encouragement to peers and discussed stigma experienced with different diagnoses.   Therapeutic Modalities:   Cognitive Behavioral Therapy Solution Focused Therapy Motivational Interviewing Relapse Prevention Therapy  Chad CordialLauren Carter, LCSWA 10/21/2015 3:00 PM
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
## 2015-10-21 NOTE — Progress Notes (Signed)
Recreation Therapy Notes   Animal-Assisted Activity (AAA) Program Checklist/Progress Notes Patient Eligibility Criteria Checklist & Daily Group note for Rec TxIntervention  Date: 08.08.2017 Time: 2:45pm [address]    AAA/T Program Assumption of Risk Form signed by Patient/ or Parent Legal Guardian Yes  Patient is free of allergies or sever asthma Yes  Patient reports no fear of animals Yes  Patient reports no history of cruelty to animals Yes  Patient understands his/her participation is voluntary Yes  Patient washes hands before animal contact Yes  Patient washes hands after animal contact Yes  Behavioral Response: Engaged, Attentive  Education:Hand Washing, Appropriate Animal Interaction   Education Outcome: Acknowledges education.   Clinical Observations/Feedback: Patient attended session and interacted appropriately with therapy dog and peers.   Marykay Lexenise L Maylin [place], LRT/CTRS  Kemiya Batdorf L 10/21/2015 3:12 PM

## 2015-10-22 DIAGNOSIS — F333 Major depressive disorder, recurrent, severe with psychotic symptoms: Principal | ICD-10-CM

## 2015-10-22 LAB — GC/CHLAMYDIA PROBE AMP (~~LOC~~) NOT AT ARMC
Chlamydia: NEGATIVE
Neisseria Gonorrhea: NEGATIVE

## 2015-10-22 LAB — HEPATITIS B SURFACE ANTIGEN: HEP B S AG: NEGATIVE

## 2015-10-22 MED ORDER — AMLODIPINE BESYLATE 10 MG PO TABS: 10.0000 mg | ORAL_TABLET | Freq: Every day | ORAL | 0 refills | Status: DC

## 2015-10-22 MED ORDER — AMLODIPINE BESYLATE 10 MG PO TABS
10.0000 mg | ORAL_TABLET | Freq: Every day | ORAL | Status: DC
  Administered 2015-10-22: 10 mg via ORAL
  Filled 2015-10-22: qty 1
  Filled 2015-10-22: qty 7
  Filled 2015-10-22: qty 1

## 2015-10-22 MED ORDER — LORATADINE 10 MG PO TABS: 10.0000 mg | ORAL_TABLET | Freq: Every day | ORAL | Status: DC

## 2015-10-22 MED ORDER — CLONIDINE HCL 0.1 MG PO TABS
0.1000 mg | ORAL_TABLET | Freq: Two times a day (BID) | ORAL | Status: DC
  Administered 2015-10-22: 0.1 mg via ORAL
  Filled 2015-10-22 (×2): qty 1
  Filled 2015-10-22: qty 14
  Filled 2015-10-22: qty 1
  Filled 2015-10-22: qty 14

## 2015-10-22 MED ORDER — CLONIDINE HCL 0.1 MG PO TABS: 0.1000 mg | ORAL_TABLET | Freq: Two times a day (BID) | ORAL | 0 refills | Status: DC

## 2015-10-22 MED ORDER — TRAZODONE HCL 100 MG PO TABS: 100.0000 mg | ORAL_TABLET | Freq: Every evening | ORAL | 0 refills | Status: DC | PRN

## 2015-10-22 NOTE — Progress Notes (Signed)
  [facility] Discharge Plan :  Will you be returning to the same living situation after discharge:  No. [facility]. At discharge, do you have transportation home?: Yes,  Pt friend to pick up Do you have the ability to pay for your medications: Yes,  Pt provided with prescriptions and samples  Release of information consent forms completed and in the chart;  Patient's signature needed at discharge.  Patient to Follow up at: Follow-up Information    Pt declines referral for services .   Why:  Pt reports that he has [facility] and will continue to facilitate this process himself.         [facility] Follow up on 10/27/2015.   Why:  [facility] for your medical care needs. The address & phone number included. Contact information: [address] [place] [phone]          Next level of care provider has access to [facility] Link:no  Safety Planning and Suicide Prevention discussed: Yes,  with Pt; two unsuccessful attempts with friend  Have you used any form of tobacco in the last 30 days? (Cigarettes, Smokeless Tobacco, Cigars, and/or Pipes): Yes  Has patient been referred to the Quitline?: Patient refused referral  Patient has been referred for addiction treatment: Yes  Elaina HoopsCarter, Klyde Banka M 10/22/2015, 10:23 AM

## 2015-10-22 NOTE — Progress Notes (Signed)
Phone consult for BP management, cocaine user, placed on Norvasc and Clonidine, follow with PCP in 7-10 days

## 2015-10-22 NOTE — [facility] Suicide Risk Assessment (Signed)
[facility] INPATIENT:  Family/Significant Other Suicide Prevention Education  Suicide Prevention Education:  Contact Attempts: Adam [name], Pt's friend [phone], has been identified by the patient as the family member/significant other with whom the patient will be residing, and identified as the person(s) who will aid the patient in the event of a mental health crisis.  With written consent from the patient, two attempts were made to provide suicide prevention education, prior to and/or following the patient's discharge.  We were unsuccessful in providing suicide prevention education.  A suicide education pamphlet was given to the patient to share with family/significant other.  Date and time of first attempt: 10/22/15 @ 8:45am Date and time of second attempt: 10/22/15 @ 10:05am  Adam [name], Adam [name] 10/22/2015, 10:15 AM

## 2015-10-22 NOTE — Progress Notes (Signed)
Discharge note: Pt received both written and verbal discharge instructions. Pt stated that he will f/u with [facility] in [place], [place]. Pt verbalized understanding of discharge instructions. Pt educated about new antihypertensive medications. Pt verbalizes understanding. B/p meds given to pt and writer will reassess B/p prior to walking pt off the unit. Pt received sample meds and prescriptions. Pt allowed to obtain belongings from room and locker.

## 2015-10-22 NOTE — Progress Notes (Signed)
Recreation Therapy Notes  Date: 10/22/15  Time: 0930 Location: 300 Hall Group Room  Group Topic: Stress Management  Goal Area(s) Addresses:  Patient will verbalize importance of using healthy stress management.  Patient will identify positive emotions associated with healthy stress management.   Intervention: Stress Management  Activity :  Deep Breathing.  LRT introduced patients to the stress management technique of deep breathing.  Pt were to follow along as LRT demonstrated and read a script to allow patients to participate in the activity.  Education:  Stress Management, Discharge Planning.   Education Outcome: Acknowledges edcuation/In group clarification offered/Needs additional education  Clinical Observations/Feedback: Pt did not attend group.    Yichen Gilardi, LRT/CTRS  

## 2015-10-22 NOTE — Progress Notes (Signed)
D: Pt denies SI/HI/AVH. Pt is pleasant and cooperative. Pt [facility]. Pt very lively, joking and appers to be happy when talking to Clinical research associatewriter.    A: Pt was offered support and encouragement. Pt was given scheduled medications. Pt was encourage to attend groups. Q 15 minute checks were done for safety.   R:Pt attends groups and interacts well with peers and staff. Pt is taking medication. Pt has no complaints.Pt receptive to treatment and safety maintained on unit.

## 2015-10-22 NOTE — Tx Team (Signed)
Interdisciplinary Treatment Plan Update (Adult) Date: 10/22/2015   Date: 10/22/2015 10:16 AM  Progress in Treatment:  Attending groups: Yes  Participating in groups: Yes Taking medication as prescribed: Yes  Tolerating medication: Yes  Family/Significant othe contact made: No, CSW attempted to make contact with Pt's friend Patient understands diagnosis: Continuing to assess Discussing patient identified problems/goals with staff: Yes  Medical problems stabilized or resolved: Yes  Denies suicidal/homicidal ideation: Yes Patient has not harmed self or Others: Yes   New problem(s) identified: None identified at this time.   Discharge Plan or Barriers: Pt plans to discharge to [facility] program for further substance abuse treatment  Additional comments:  Patient and CSW reviewed pt's identified goals and treatment plan. Patient verbalized understanding and agreed to treatment plan.   Reason for Continuation of Hospitalization:  Depression Medication stabilization Suicidal ideation Withdrawal symptom  Estimated length of stay: 0 days  Review of initial/current patient goals per problem list:   1.  Goal(s): Patient will participate in aftercare plan  Met:  Yes  Target date: 3-5 days from date of admission   As evidenced by: Patient will participate within aftercare plan AEB aftercare provider and housing plan at discharge being identified.  10/20/15: CSW to work with Pt to assess for appropriate discharge plan and faciliate appointments and referrals as needed prior to d/c. 10/22/2015: Pt plans to discharge to [facility] program for further substance abuse treatment  2.  Goal (s): Patient will exhibit decreased depressive symptoms and suicidal ideations.  Met:  Yes  Target date: 3-5 days from date of admission   As evidenced by: Patient will utilize self rating of depression at 3 or below and demonstrate decreased signs of depression or be deemed stable for discharge by MD. 10/20/15: Pt was  admitted with symptoms of depression, rating 10/10. Pt continues to present with flat affect and depressive symptoms.  Pt will demonstrate decreased symptoms of depression and rate depression at 3/10 or lower prior to discharge. 10/22/2015: Pt rates depression at 0/10; denies SI  3.  Goal(s): Patient will demonstrate decreased signs and symptoms of anxiety.  Met:  Yes  Target date: 3-5 days from date of admission   As evidenced by: Patient will utilize self rating of anxiety at 3 or below and demonstrated decreased signs of anxiety, or be deemed stable for discharge by MD 10/20/15: Pt was admitted with increased levels of anxiety and is currently rating those symptoms highly. Pt will demonstrated decreased symptoms of anxiety and rate it at 3/10 prior to d/c. 10/22/15: Pt rates anxiety at 0/10 4.  Goal(s): Patient will demonstrate decreased signs of withdrawal due to substance abuse  Met:  Yes  Target date: 3-5 days from date of admission   As evidenced by: Patient will produce a CIWA/COWS score of 0, have stable vitals signs, and no symptoms of withdrawal  10/20/15: CIWA score of 3; endorses anxiety and agitation as symptoms of anxiety.  10/22/2015: Pt CIWA score of 0; denies any symptoms of withdrawal.   Attendees:  Patient:    Family:    Physician: Dr. Parke Poisson, MD  10/22/2015 10:16 AM  Nursing: Loletta Specter, RN 10/22/2015 10:16 AM  Clinical Social Worker Peri Maris, [place] 10/22/2015 10:16 AM  Other: Erasmo Downer Drinkard, [place] 10/22/2015 10:16 AM  Clinical: 10/22/2015 10:16 AM  Other:  10/22/2015 10:16 AM  Other:     Peri Maris, [place] Social Work [phone]

## 2015-10-22 NOTE — [facility] Suicide Risk Assessment (Addendum)
[facility] Discharge Suicide Risk Assessment   Principal Problem: Depression, Substance Abuse  Discharge Diagnoses:  Patient Active Problem List   Diagnosis Date Noted  . MDD (major depressive disorder), recurrent, severe, with psychosis (HCC) [F33.3] 10/19/2015  . Suicidal ideation [R45.851]   . Alcohol use disorder, severe, dependence (HCC) [F10.20] 10/27/2014  . Substance induced mood disorder (HCC) [F19.94] 10/27/2014  . MDD (major depressive disorder), recurrent severe, without psychosis (HCC) [F33.2] 10/27/2014  . Polysubstance abuse [F19.10]   . Alcohol use disorder, severe, dependence (HCC) [F10.20] 10/22/2014  . Polysubstance abuse [F19.10] 05/18/2014  . Alcohol dependence with alcohol-induced mood disorder (HCC) [F10.24] 05/18/2014  . Alcoholic ketoacidosis [E87.2] 56/21/3086  . Abdominal pain [R10.9] 05/02/2013  . MDD (major depressive disorder) (HCC) [F32.9] 04/11/2013  . Alcohol intoxication in active alcoholic (HCC) [F10.229] 02/24/2013  . Alcohol dependence (HCC) [F10.20] 06/01/2012  . Cocaine abuse [F14.10] 06/01/2012  . Substance induced mood disorder (HCC) [F19.94] 06/01/2012    Total Time spent with patient: 30 minutes  Musculoskeletal: Strength & Muscle Tone: within normal limits Gait & Station: normal Patient leans: N/A  Psychiatric Specialty Exam: ROS  No headache, no chest pain, no shortness of breath, no vomiting, no nausea   Blood pressure (!) 139/98, pulse 70, temperature 98.7 F (37.1 C), temperature source Oral, resp. rate 20, height  (1.803 m), weight 197 lb (89.4 kg), SpO2 97 %.Body mass index is 27.48 kg/m. Repeat BP 140/97   General Appearance: Well Groomed  Eye Contact::  Good  Speech:  Normal Rate  Volume:  Normal  Mood:  improved , denies depression   Affect:  more reactive , fuller in range   Thought Process:  Linear  Orientation:  Full (Time, Place, and Person)  Thought Content:  denies any hallucinations, no delusions   Suicidal  Thoughts:  No- denies any suicidal ideations, no self injurious ideations   Homicidal Thoughts:  No- denies  Any homicidal ideations   Memory:  recent and remote grossly intact   Judgement: improved   Insight:  improved   Psychomotor Activity:  Normal  Concentration:  Good  Recall:  Good  Fund of Knowledge:Good  Language: Good  Akathisia:  Negative  Handed:  Right  AIMS (if indicated):     Assets:  Desire for Improvement Resilience  Sleep:  Number of Hours: 6.75  Cognition: WNL  ADL's:  Intact   Mental Status Per Nursing Assessment::   On Admission:     Demographic Factors:  30 year old male   Loss Factors: Parents medically ill, mother in a nursing home, homeless   Historical Factors: History of depression, history of alcohol and cocaine dependencies    Risk Reduction Factors:   Positive coping skills or problem solving skills  Continued Clinical Symptoms:  At this time patient improved compared to admission- mood improved, affect fuller in range, no thought disorder, no suicidal or homicidal ideations, no psychotic symptoms,future oriented, plans to go to [facility] today  * of note, patient has had elevated BP- he has no prior history of HTN. He states he has never been on antihypertensive medications. We reviewed and consulted with Hospitalist, Dr. Sharlyn Bologna - as per recommendation he was  started on Catapress, Norvasc- patient agrees, side effects discussed . Importance of following up as outpatient emphasized . Patient to follow up with [facility] for ongoing treatment .  Cognitive Features That Contribute To Risk:  No gross cognitive deficits noted upon discharge. Is alert , attentive, and oriented x  3   Suicide Risk:  Mild:  Suicidal ideation of limited frequency, intensity, duration, and specificity.  There are no identifiable plans, no associated intent, mild dysphoria and related symptoms, good self-control (both objective and subjective assessment), few other  risk factors, and identifiable protective factors, including available and accessible social support.  Follow-up Information    Pt declines referral for services .   Why:  Pt reports that he has already arranged a [facility] and will continue to facilitate this process himself.           Plan Of Care/Follow-up recommendations:  Activity:  as tolerated  Diet:  Regular  Tests:  NA Other:  see below   Patient is leaving unit in good spirits  States a friend of his will drive him to [facility]  Of note, BP has tended to be elevated- no symptoms - patient advised to follow up with PCP for management  [facility] for medical follow up   Nehemiah MassedOBOS, FERNANDO, MD 10/22/2015, 9:32 AM
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
## 2015-10-23 ENCOUNTER — Encounter (HOSPITAL_COMMUNITY): Payer: Self-pay | Admitting: Emergency Medicine

## 2015-10-23 ENCOUNTER — Emergency Department (HOSPITAL_COMMUNITY)
Admission: EM | Admit: 2015-10-23 | Discharge: 2015-10-24 | Disposition: A | Discharge status: Home / Self Care | Payer: Self-pay | Attending: Emergency Medicine | Admitting: Emergency Medicine

## 2015-10-23 DIAGNOSIS — R45851 Suicidal ideations: Secondary | ICD-10-CM | POA: Insufficient documentation

## 2015-10-23 DIAGNOSIS — F1414 Cocaine abuse with cocaine-induced mood disorder: Secondary | ICD-10-CM | POA: Diagnosis present

## 2015-10-23 DIAGNOSIS — F17210 Nicotine dependence, cigarettes, uncomplicated: Secondary | ICD-10-CM | POA: Insufficient documentation

## 2015-10-23 DIAGNOSIS — F1020 Alcohol dependence, uncomplicated: Secondary | ICD-10-CM | POA: Diagnosis present

## 2015-10-23 DIAGNOSIS — Z79899 Other long term (current) drug therapy: Secondary | ICD-10-CM | POA: Insufficient documentation

## 2015-10-23 DIAGNOSIS — F1410 Cocaine abuse, uncomplicated: Secondary | ICD-10-CM

## 2015-10-23 LAB — ACETAMINOPHEN LEVEL: Acetaminophen (Tylenol), Serum: <10 ug/mL — ABNORMAL LOW (ref 10–30)

## 2015-10-23 LAB — COMPREHENSIVE METABOLIC PANEL
ALBUMIN: 4.2 g/dL (ref 3.5–5.0)
ALT: 45 U/L (ref 17–63)
AST: 58 U/L — AB (ref 15–41)
Alkaline Phosphatase: 73 U/L (ref 38–126)
Anion gap: 12 (ref 5–15)
BILIRUBIN TOTAL: 0.7 mg/dL (ref 0.3–1.2)
BUN: 10 mg/dL (ref 6–20)
CHLORIDE: 104 mmol/L (ref 101–111)
CO2: 25 mmol/L (ref 22–32)
CREATININE: 0.86 mg/dL (ref 0.61–1.24)
Calcium: 9.2 mg/dL (ref 8.9–10.3)
GFR calc Af Amer: >60 mL/min (ref >60)
GLUCOSE: 83 mg/dL (ref 65–99)
POTASSIUM: 3.5 mmol/L (ref 3.5–5.1)
Sodium: 141 mmol/L (ref 135–145)
TOTAL PROTEIN: 7.7 g/dL (ref 6.5–8.1)

## 2015-10-23 LAB — CBC
HEMATOCRIT: 43.6 % (ref 39.0–52.0)
Hemoglobin: 15.5 g/dL (ref 13.0–17.0)
MCH: 30.2 pg (ref 26.0–34.0)
MCHC: 35.6 g/dL (ref 30.0–36.0)
MCV: 85 fL (ref 78.0–100.0)
Platelets: 216 10*3/uL (ref 150–400)
RBC: 5.13 MIL/uL (ref 4.22–5.81)
RDW: 16.4 % — ABNORMAL HIGH (ref 11.5–15.5)
WBC: 12.5 10*3/uL — AB (ref 4.0–10.5)

## 2015-10-23 LAB — ETHANOL: Alcohol, Ethyl (B): 90 mg/dL — ABNORMAL HIGH (ref <5)

## 2015-10-23 LAB — RAPID URINE DRUG SCREEN, HOSP PERFORMED
AMPHETAMINES: NOT DETECTED
BARBITURATES: NOT DETECTED
BENZODIAZEPINES: NOT DETECTED
Cocaine: POSITIVE — AB
Opiates: NOT DETECTED
Tetrahydrocannabinol: NOT DETECTED

## 2015-10-23 LAB — HIV ANTIBODY (ROUTINE TESTING W REFLEX): HIV SCREEN 4TH GENERATION: NONREACTIVE

## 2015-10-23 LAB — RPR: RPR Ser Ql: NONREACTIVE

## 2015-10-23 LAB — HEPATITIS C ANTIBODY

## 2015-10-23 LAB — SALICYLATE LEVEL: Salicylate Lvl: <4 mg/dL (ref 2.8–30.0)

## 2015-10-23 MED ORDER — NICOTINE 21 MG/24HR TD PT24: 21.0000 mg | MEDICATED_PATCH | Freq: Every day | TRANSDERMAL | Status: DC | PRN

## 2015-10-23 MED ORDER — AMLODIPINE BESYLATE 10 MG PO TABS
10.0000 mg | ORAL_TABLET | Freq: Every day | ORAL | Status: DC
  Administered 2015-10-23: 10 mg via ORAL
  Filled 2015-10-23 (×2): qty 1

## 2015-10-23 MED ORDER — CLONIDINE HCL 0.1 MG PO TABS
0.1000 mg | ORAL_TABLET | Freq: Two times a day (BID) | ORAL | Status: DC
  Administered 2015-10-23: 0.1 mg via ORAL
  Filled 2015-10-23 (×2): qty 1

## 2015-10-23 MED ORDER — ONDANSETRON HCL 4 MG PO TABS: 4.0000 mg | ORAL_TABLET | Freq: Three times a day (TID) | ORAL | Status: DC | PRN

## 2015-10-23 MED ORDER — ZOLPIDEM TARTRATE 5 MG PO TABS: 5.0000 mg | ORAL_TABLET | Freq: Every evening | ORAL | Status: DC | PRN

## 2015-10-23 MED ORDER — ACETAMINOPHEN 325 MG PO TABS: 650.0000 mg | ORAL_TABLET | ORAL | Status: DC | PRN

## 2015-10-23 MED ORDER — ALUM & MAG HYDROXIDE-SIMETH 200-200-20 MG/5ML PO SUSP: 30.0000 mL | ORAL | Status: DC | PRN

## 2015-10-23 MED ORDER — TRAZODONE HCL 100 MG PO TABS
100.0000 mg | ORAL_TABLET | Freq: Every evening | ORAL | Status: DC | PRN
  Administered 2015-10-23: 100 mg via ORAL
  Filled 2015-10-23: qty 1

## 2015-10-23 MED ORDER — IBUPROFEN 200 MG PO TABS: 600.0000 mg | ORAL_TABLET | Freq: Three times a day (TID) | ORAL | Status: DC | PRN
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
## 2015-10-23 NOTE — ED Notes (Signed)
Pt resting at present, no distress noted, calm & cooperative. A&O x 3. Remains SI.  Monitoring for safety, Q 15 min checks in effect.

## 2015-10-23 NOTE — ED Notes (Signed)
Patient denies pain and is resting comfortably.  

## 2015-10-23 NOTE — ED Notes (Signed)
[id number] Expected date: 10/23/15 Expected time:  Means of arrival:  Comments: Margo AyeHall B

## 2015-10-23 NOTE — ED Triage Notes (Signed)
Pt walked up to [facility] truck stating that he used crack cocaine at 1430 and has not eaten in three days as well as having thoughts of killing him self, by using a gun. Pt a/o at triage and ambulatory.

## 2015-10-23 NOTE — ED Notes (Addendum)
Patient A/O, no noted distress. Denies pain. Admits to SI, with a gun. Patient notes that [address] on [address] the drug dealer kept giving him crack to smoke, he got tired of smoking and tried to take the gun from drug dealer to kill himself. He admits to being depressed expresses that his mother is over his daddy disability check, which he is 100% [employer] disable. The mother smokes crack, his mother smoke crack when she was pregnant with his brother. He is unable to contact parents resulted in no home phone and he has not seen them in a year. "I just want to die, need to stop smoking crack. [facility], clean for 2 years and graduated. Patient admits he need a long term treatment program. Admits to hearing voices demanding to get that person, he tries blocking but continues to come back. Patient is homeless. [facility]. Staff will continue to monitor and meet needs and maintain safety.

## 2015-10-24 DIAGNOSIS — R45851 Suicidal ideations: Secondary | ICD-10-CM

## 2015-10-24 DIAGNOSIS — F1414 Cocaine abuse with cocaine-induced mood disorder: Secondary | ICD-10-CM

## 2015-10-24 NOTE — [facility] Suicide Risk Assessment (Signed)
Suicide Risk Assessment  Discharge Assessment   [facility] Discharge Suicide Risk Assessment   Principal Problem: Cocaine abuse with cocaine-induced mood disorder [facility]) Discharge Diagnoses:  Patient Active Problem List   Diagnosis Date Noted  . Cocaine abuse with cocaine-induced mood disorder [facility]) [F14.14] 10/24/2015    Priority: High  . Alcohol dependence (HCC) [F10.20] 06/01/2012    Priority: High  . MDD (major depressive disorder), recurrent, severe, with psychosis (HCC) [F33.3] 10/19/2015  . Suicidal ideation [R45.851]   . Alcohol use disorder, severe, dependence (HCC) [F10.20] 10/27/2014  . Substance induced mood disorder (HCC) [F19.94] 10/27/2014  . MDD (major depressive disorder), recurrent severe, without psychosis (HCC) [F33.2] 10/27/2014  . Polysubstance abuse [F19.10]   . Alcohol use disorder, severe, dependence (HCC) [F10.20] 10/22/2014  . Polysubstance abuse [F19.10] 05/18/2014  . Alcohol dependence with alcohol-induced mood disorder (HCC) [F10.24] 05/18/2014  . Alcoholic ketoacidosis [E87.2] 16/12/9602  . Abdominal pain [R10.9] 05/02/2013  . MDD (major depressive disorder) (HCC) [F32.9] 04/11/2013  . Alcohol intoxication in active alcoholic [facility]) [F10.229] 02/24/2013    Total Time spent with patient: 45 minutes  Musculoskeletal: Strength & Muscle Tone: within normal limits Gait & Station: normal Patient leans: N/A  Psychiatric Specialty Exam: Physical Exam  Constitutional: He is oriented to person, place, and time. He appears well-developed and well-nourished.  HENT:  Head: Normocephalic.  Neck: Normal range of motion.  Respiratory: Effort normal.  Musculoskeletal: Normal range of motion.  Neurological: He is alert and oriented to person, place, and time.  Skin: Skin is warm and dry.  Psychiatric: He has a normal mood and affect. His speech is normal and behavior is normal. Judgment and thought content normal. Cognition and memory are normal.    Review of  Systems  Constitutional: Negative.   HENT: Negative.   Eyes: Negative.   Respiratory: Negative.   Cardiovascular: Negative.   Gastrointestinal: Negative.   Genitourinary: Negative.   Musculoskeletal: Negative.   Skin: Negative.   Neurological: Negative.   Endo/Heme/Allergies: Negative.   Psychiatric/Behavioral: Positive for substance abuse.    Blood pressure 119/71, pulse 70, temperature 98 F (36.7 C), temperature source Oral, resp. rate 16, height  (1.803 m), weight 89.4 kg (197 lb), SpO2 98 %.Body mass index is 27.48 kg/m.  General Appearance: Casual  Eye Contact:  Good  Speech:  Normal Rate  Volume:  Normal  Mood:  Irritable  Affect:  Congruent  Thought Process:  Coherent and Descriptions of Associations: Intact  Orientation:  Full (Time, Place, and Person)  Thought Content:  WDL  Suicidal Thoughts:  No  Homicidal Thoughts:  No  Memory:  Immediate;   Good Recent;   Good Remote;   Good  Judgement:  Fair  Insight:  Fair  Psychomotor Activity:  Normal  Concentration:  Concentration: Good and Attention Span: Good  Recall:  Good  Fund of Knowledge:  Good  Language:  Good  Akathisia:  No  Handed:  Right  AIMS (if indicated):     Assets:  Leisure Time Physical Health Resilience Social Support  ADL's:  Intact  Cognition:  WNL  Sleep:      Mental Status Per Nursing Assessment::   On Admission:   cocaine and alcohol abuse with suicidal ideations  Demographic Factors:  Male  Loss Factors: NA  Historical Factors: NA  Risk Reduction Factors:   Sense of responsibility to family, Living with another person, especially a relative and Positive social support  Continued Clinical Symptoms:  Irritability  Cognitive Features That  Contribute To Risk:  None    Suicide Risk:  Minimal: No identifiable suicidal ideation.  Patients presenting with no risk factors but with morbid ruminations; may be classified as minimal risk based on the severity of the  depressive symptoms    Plan Of Care/Follow-up recommendations:  Activity:  as tolerated Diet:  heart healthy diet  Simi Briel, NP 10/24/2015, 9:47 AM
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
## 2015-10-26 ENCOUNTER — Encounter (HOSPITAL_COMMUNITY): Payer: Self-pay | Admitting: Emergency Medicine

## 2015-10-26 ENCOUNTER — Observation Stay (HOSPITAL_COMMUNITY)
Admission: AD | Admit: 2015-10-26 | Discharge: 2015-10-28 | Disposition: A | Discharge status: Home / Self Care | Payer: Federal, State, Local not specified - Other | Source: Intra-hospital | Attending: Psychiatry | Admitting: Psychiatry

## 2015-10-26 ENCOUNTER — Encounter (HOSPITAL_COMMUNITY): Payer: Self-pay | Admitting: *Deleted

## 2015-10-26 ENCOUNTER — Emergency Department (HOSPITAL_COMMUNITY)
Admission: EM | Admit: 2015-10-26 | Discharge: 2015-10-26 | Disposition: A | Discharge status: Other Acute Inpatient Hospital | Payer: Self-pay | Attending: Emergency Medicine | Admitting: Emergency Medicine

## 2015-10-26 DIAGNOSIS — F419 Anxiety disorder, unspecified: Secondary | ICD-10-CM | POA: Insufficient documentation

## 2015-10-26 DIAGNOSIS — F1420 Cocaine dependence, uncomplicated: Principal | ICD-10-CM | POA: Diagnosis present

## 2015-10-26 DIAGNOSIS — F1290 Cannabis use, unspecified, uncomplicated: Secondary | ICD-10-CM | POA: Insufficient documentation

## 2015-10-26 DIAGNOSIS — R4589 Other symptoms and signs involving emotional state: Secondary | ICD-10-CM

## 2015-10-26 DIAGNOSIS — R45851 Suicidal ideations: Secondary | ICD-10-CM | POA: Insufficient documentation

## 2015-10-26 DIAGNOSIS — Z813 Family history of other psychoactive substance abuse and dependence: Secondary | ICD-10-CM | POA: Insufficient documentation

## 2015-10-26 DIAGNOSIS — Z79899 Other long term (current) drug therapy: Secondary | ICD-10-CM | POA: Insufficient documentation

## 2015-10-26 DIAGNOSIS — R4689 Other symptoms and signs involving appearance and behavior: Secondary | ICD-10-CM

## 2015-10-26 DIAGNOSIS — Z9889 Other specified postprocedural states: Secondary | ICD-10-CM | POA: Insufficient documentation

## 2015-10-26 DIAGNOSIS — F1910 Other psychoactive substance abuse, uncomplicated: Secondary | ICD-10-CM | POA: Insufficient documentation

## 2015-10-26 DIAGNOSIS — I10 Essential (primary) hypertension: Secondary | ICD-10-CM | POA: Insufficient documentation

## 2015-10-26 DIAGNOSIS — F17210 Nicotine dependence, cigarettes, uncomplicated: Secondary | ICD-10-CM | POA: Insufficient documentation

## 2015-10-26 DIAGNOSIS — F1020 Alcohol dependence, uncomplicated: Secondary | ICD-10-CM | POA: Insufficient documentation

## 2015-10-26 DIAGNOSIS — F332 Major depressive disorder, recurrent severe without psychotic features: Secondary | ICD-10-CM | POA: Insufficient documentation

## 2015-10-26 LAB — COMPREHENSIVE METABOLIC PANEL
ALT: 49 U/L (ref 17–63)
AST: 64 U/L — AB (ref 15–41)
Albumin: 3.8 g/dL (ref 3.5–5.0)
Alkaline Phosphatase: 68 U/L (ref 38–126)
Anion gap: 5 (ref 5–15)
BUN: 8 mg/dL (ref 6–20)
CHLORIDE: 102 mmol/L (ref 101–111)
CO2: 31 mmol/L (ref 22–32)
CREATININE: 0.94 mg/dL (ref 0.61–1.24)
Calcium: 8.8 mg/dL — ABNORMAL LOW (ref 8.9–10.3)
GFR calc Af Amer: >60 mL/min (ref >60)
GFR calc non Af Amer: >60 mL/min (ref >60)
Glucose, Bld: 109 mg/dL — ABNORMAL HIGH (ref 65–99)
Potassium: 4.2 mmol/L (ref 3.5–5.1)
SODIUM: 138 mmol/L (ref 135–145)
Total Bilirubin: 1 mg/dL (ref 0.3–1.2)
Total Protein: 7.1 g/dL (ref 6.5–8.1)

## 2015-10-26 LAB — CBC
HCT: 44.1 % (ref 39.0–52.0)
HEMOGLOBIN: 15.2 g/dL (ref 13.0–17.0)
MCH: 29.9 pg (ref 26.0–34.0)
MCHC: 34.5 g/dL (ref 30.0–36.0)
MCV: 86.8 fL (ref 78.0–100.0)
Platelets: 204 10*3/uL (ref 150–400)
RBC: 5.08 MIL/uL (ref 4.22–5.81)
RDW: 16.4 % — ABNORMAL HIGH (ref 11.5–15.5)
WBC: 9.7 10*3/uL (ref 4.0–10.5)

## 2015-10-26 LAB — RAPID URINE DRUG SCREEN, HOSP PERFORMED
AMPHETAMINES: NOT DETECTED
BENZODIAZEPINES: NOT DETECTED
Barbiturates: NOT DETECTED
Cocaine: POSITIVE — AB
OPIATES: NOT DETECTED
TETRAHYDROCANNABINOL: NOT DETECTED

## 2015-10-26 LAB — ACETAMINOPHEN LEVEL: Acetaminophen (Tylenol), Serum: <10 ug/mL — ABNORMAL LOW (ref 10–30)

## 2015-10-26 LAB — SALICYLATE LEVEL

## 2015-10-26 LAB — ETHANOL: Alcohol, Ethyl (B): <5 mg/dL (ref <5)

## 2015-10-26 MED ORDER — ZOLPIDEM TARTRATE 5 MG PO TABS: 5.0000 mg | ORAL_TABLET | Freq: Every evening | ORAL | Status: DC | PRN

## 2015-10-26 MED ORDER — MAGNESIUM HYDROXIDE 400 MG/5ML PO SUSP: 30.0000 mL | Freq: Every day | ORAL | Status: DC | PRN

## 2015-10-26 MED ORDER — LORATADINE 10 MG PO TABS
10.0000 mg | ORAL_TABLET | Freq: Every day | ORAL | Status: DC
  Administered 2015-10-26: 10 mg via ORAL
  Filled 2015-10-26: qty 1

## 2015-10-26 MED ORDER — ACETAMINOPHEN 325 MG PO TABS: 650.0000 mg | ORAL_TABLET | ORAL | Status: DC | PRN

## 2015-10-26 MED ORDER — AMLODIPINE BESYLATE 5 MG PO TABS
10.0000 mg | ORAL_TABLET | Freq: Every day | ORAL | Status: DC
  Administered 2015-10-27 – 2015-10-28 (×2): 10 mg via ORAL
  Filled 2015-10-26 (×2): qty 2

## 2015-10-26 MED ORDER — TRAZODONE HCL 100 MG PO TABS
100.0000 mg | ORAL_TABLET | Freq: Every evening | ORAL | Status: DC | PRN
Start: 2015-10-26 — End: 2015-10-28
  Administered 2015-10-26 – 2015-10-27 (×2): 100 mg via ORAL
  Filled 2015-10-26 (×2): qty 1

## 2015-10-26 MED ORDER — IBUPROFEN 200 MG PO TABS: 600.0000 mg | ORAL_TABLET | Freq: Three times a day (TID) | ORAL | Status: DC | PRN

## 2015-10-26 MED ORDER — CLONIDINE HCL 0.1 MG PO TABS
0.1000 mg | ORAL_TABLET | Freq: Two times a day (BID) | ORAL | Status: DC
  Administered 2015-10-26: 0.1 mg via ORAL
  Filled 2015-10-26: qty 1

## 2015-10-26 MED ORDER — NICOTINE 21 MG/24HR TD PT24: 21.0000 mg | MEDICATED_PATCH | Freq: Every day | TRANSDERMAL | Status: DC

## 2015-10-26 MED ORDER — ALUM & MAG HYDROXIDE-SIMETH 200-200-20 MG/5ML PO SUSP: 30.0000 mL | ORAL | Status: DC | PRN

## 2015-10-26 MED ORDER — AMLODIPINE BESYLATE 5 MG PO TABS
10.0000 mg | ORAL_TABLET | Freq: Every day | ORAL | Status: DC
  Administered 2015-10-26: 10 mg via ORAL
  Filled 2015-10-26: qty 2

## 2015-10-26 MED ORDER — ONDANSETRON HCL 4 MG PO TABS: 4.0000 mg | ORAL_TABLET | Freq: Three times a day (TID) | ORAL | Status: DC | PRN

## 2015-10-26 MED ORDER — ALUM & MAG HYDROXIDE-SIMETH 200-200-20 MG/5ML PO SUSP
30.0000 mL | ORAL | Status: DC | PRN
  Administered 2015-10-27: 30 mL via ORAL
  Filled 2015-10-26: qty 30

## 2015-10-26 MED ORDER — ACETAMINOPHEN 325 MG PO TABS: 650.0000 mg | ORAL_TABLET | Freq: Four times a day (QID) | ORAL | Status: DC | PRN

## 2015-10-26 MED ORDER — LORAZEPAM 1 MG PO TABS
1.0000 mg | ORAL_TABLET | Freq: Three times a day (TID) | ORAL | Status: DC | PRN
Start: 2015-10-26 — End: 2015-10-26

## 2015-10-26 NOTE — ED Notes (Signed)
[facility]
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
## 2015-10-26 NOTE — ED Provider Notes (Signed)
Patient depressed over his mother's illness lack of contact with family member, his drug use and homelessness. He is feeling suicidal. He reports that he has access to a gun. Patient is alert cooperative Glasgow Coma Score 15. Results for orders placed or performed during the hospital encounter of 10/26/15  Comprehensive metabolic panel  Result Value Ref Range   Sodium 138 135 - 145 mmol/L   Potassium 4.2 3.5 - 5.1 mmol/L   Chloride 102 101 - 111 mmol/L   CO2 31 22 - 32 mmol/L   Glucose, Bld 109 (H) 65 - 99 mg/dL   BUN 8 6 - 20 mg/dL   Creatinine, Ser 1.610.94 0.61 - 1.24 mg/dL   Calcium 8.8 (L) 8.9 - 10.3 mg/dL   Total Protein 7.1 6.5 - 8.1 g/dL   Albumin 3.8 3.5 - 5.0 g/dL   AST 64 (H) 15 - 41 U/L   ALT 49 17 - 63 U/L   Alkaline Phosphatase 68 38 - 126 U/L   Total Bilirubin 1.0 0.3 - 1.2 mg/dL   GFR calc non Af Amer >60 >60 mL/min   GFR calc Af Amer >60 >60 mL/min   Anion gap 5 5 - 15  [name]  Result Value Ref Range   Alcohol, Ethyl (B) <5 <5 mg/dL  Salicylate level  Result Value Ref Range   Salicylate Lvl <4.0 2.8 - 30.0 mg/dL  Acetaminophen level  Result Value Ref Range   Acetaminophen (Tylenol), Serum <10 (L) 10 - 30 ug/mL  cbc  Result Value Ref Range   WBC 9.7 4.0 - 10.5 K/uL   RBC 5.08 4.22 - 5.81 MIL/uL   Hemoglobin 15.2 13.0 - 17.0 g/dL   HCT 09.644.1 04.539.0 - 40.952.0 %   MCV 86.8 78.0 - 100.0 fL   MCH 29.9 26.0 - 34.0 pg   MCHC 34.5 30.0 - 36.0 g/dL   RDW 81.116.4 (H) 91.411.5 - 78.215.5 %   Platelets 204 150 - 400 K/uL  Rapid urine drug screen (hospital performed)  Result Value Ref Range   Opiates NONE DETECTED NONE DETECTED   Cocaine POSITIVE (A) NONE DETECTED   Benzodiazepines NONE DETECTED NONE DETECTED   Amphetamines NONE DETECTED NONE DETECTED   Tetrahydrocannabinol NONE DETECTED NONE DETECTED   Barbiturates NONE DETECTED NONE DETECTED   No results found.   Doug SouSam Yobani Schertzer, MD 10/27/15 [phone]
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
## 2015-10-26 NOTE — ED Notes (Signed)
[facility] called and are willing to take him pending UDS.

## 2015-10-26 NOTE — ED Triage Notes (Signed)
Wants help for suicidal thoughts. Plans to shoot himself with a gun when he gets it back from the crack dealer. Using crack, last use this morning (smokes it).

## 2015-10-26 NOTE — ED Notes (Signed)
[facility] stable [facility].  [place] here for transport.  Patient A&Ox4 at this time

## 2015-10-27 DIAGNOSIS — F1420 Cocaine dependence, uncomplicated: Secondary | ICD-10-CM | POA: Diagnosis not present

## 2015-10-27 DIAGNOSIS — R45851 Suicidal ideations: Secondary | ICD-10-CM | POA: Diagnosis not present

## 2015-10-27 MED ORDER — LORATADINE 10 MG PO TABS
10.0000 mg | ORAL_TABLET | Freq: Every day | ORAL | Status: DC | PRN
  Administered 2015-10-27: 10 mg via ORAL
  Filled 2015-10-27: qty 1

## 2015-10-27 NOTE — [facility] Counselor (Signed)
Pt would like to go to a residential treatment facility to assist with his substance abuse issues. Pt has been here several times and verbally admitted that he needs help, due to his life spiraling out of control. Pt states that [facility] [place], but wasn't able to find a ride to get him there. Pt would like for AM Counselor to call [phone] to see if his bed is still available. Pt needs assistance in various areas in his life. OBS counselor mentioned several other Residential facilities to this patient ([facility], [name], and [facility]). AM Counselor will need to check bed availability at the above mentioned facilities in an ongoing effort to meet the needs of this pt.

## 2015-10-27 NOTE — Progress Notes (Signed)
D:  Patient cooperative;  Affect flat; mood depressed/anxious; he reports that he continues to have suicidal thoughts "to shoot himself with crack dude's gun". He verbally contracts for safety; he denies homicidal ideation and AVH. He reports that he is homeless. A:  Emotional support provided; encouraged him to seek assistance with needs/concerns. R:  Safety maintained on unit.

## 2015-10-27 NOTE — Progress Notes (Signed)
[name] and [facility] called with no answer.  [facility] called to see if they had any beds availabe they said that they had no male beds available at present time. 

## 2015-10-27 NOTE — Progress Notes (Signed)
Admission Note:  D: Patient is a 30 years old male admitted in Obs. Unit [facility] due to SI with a plan to shoot himself. On admission, patient appear flat and cooperative with the admission process. Patient reports crack cocaine and alcohol abuse. Patient report being homeless and no support system. Endorses SI. Patient verbally contracted for safety. Denies AH/VH at this time.  A: Skin assessed, no conrarband found.tattoo noted at the upperright arm. POC and unit policies explained and understanding verbalized. Consents obtained. Accepted food and fluids offered..  R: Patient had no additional questions or concerns.

## 2015-10-27 NOTE — Progress Notes (Signed)
SBAR report given to Vernard GamblesJoy H RN

## 2015-10-27 NOTE — Progress Notes (Signed)
[facility] [name], [facility] tomorrow.

## 2015-10-27 NOTE — Progress Notes (Addendum)
[facility], [facility] and [name] called and all said they have no available beds.
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
## 2015-10-27 NOTE — Progress Notes (Signed)
Adam [name] and they said patient had been accepted for admission and that they would have bed for him tomorrow 10/28/2015.

## 2015-10-28 DIAGNOSIS — F1420 Cocaine dependence, uncomplicated: Secondary | ICD-10-CM | POA: Diagnosis not present

## 2015-10-28 MED ORDER — TRAZODONE HCL 100 MG PO TABS: 100.0000 mg | ORAL_TABLET | Freq: Every evening | ORAL | 0 refills | Status: DC | PRN

## 2015-10-28 MED ORDER — CLONIDINE HCL 0.1 MG PO TABS: 0.1000 mg | ORAL_TABLET | Freq: Two times a day (BID) | ORAL | 11 refills | Status: DC

## 2015-10-28 MED ORDER — LORATADINE 10 MG PO TABS: 10.0000 mg | ORAL_TABLET | Freq: Every day | ORAL | Status: DC

## 2015-10-28 MED ORDER — AMLODIPINE BESYLATE 10 MG PO TABS: 10.0000 mg | ORAL_TABLET | Freq: Every day | ORAL | Status: DC

## 2015-10-28 NOTE — Progress Notes (Signed)
D:  Patient cooperative;  He denies suicidal and homicidal ideation and AVH; He verbally contracts for safety; he denies homicidal ideation and AVH. No self-injurious behaviors noted or reported. A:  Emotional support provided; encouraged him to seek assistance with needs/concerns. R:  Safety maintained on unit.
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
## 2015-10-28 NOTE — Progress Notes (Addendum)
Patient was talking loudly and cursing with Bed 3 about past experiences. Patient denied SI, HI, and AVH. There was an inconsistency with thought content. Patient had complaints of constipation. Patient was given Milk of Mag. Patient denied pain.   Patient remains safe with constant observation with the exception of restroom times. Patient was offered support, education, and encouragement.   Patient is receptive and compliant, will continue to monitor.

## 2015-10-28 NOTE — [facility]HH Counselor (Signed)
Pt has been accepted to [facility] in [place], and is expected to be discharged by 12 noon. This pt will be provided with a train ticket and a bus pass to assist him with transportation to the detox program.

## 2015-10-28 NOTE — Discharge Instructions (Signed)
Pt has been [facility] to [facility] and was provided with a bus pass to the [employer] [facility] [employer] and another ticket for [employer] to get to [place].

## 2015-10-28 NOTE — Progress Notes (Signed)
Written/verbal discharge instructions and information regarding follow-up appointments given to patient with verbalization of understanding;  Patient denies suicidal and homicidal ideation. Discharged home in stable condition.  All patient belongings returned to patient at time of discharge. Pt given bus pass to go to [employer] and train ticket to go to [facility] in [place].

## 2016-03-02 IMAGING — CR DG CHEST 1V PORT
1 series · 1 of 1 positions shown · non-contrast
Comparison: 09/22/2013

CLINICAL DATA: Chest pain today

EXAM:
PORTABLE CHEST - 1 VIEW

[AP]
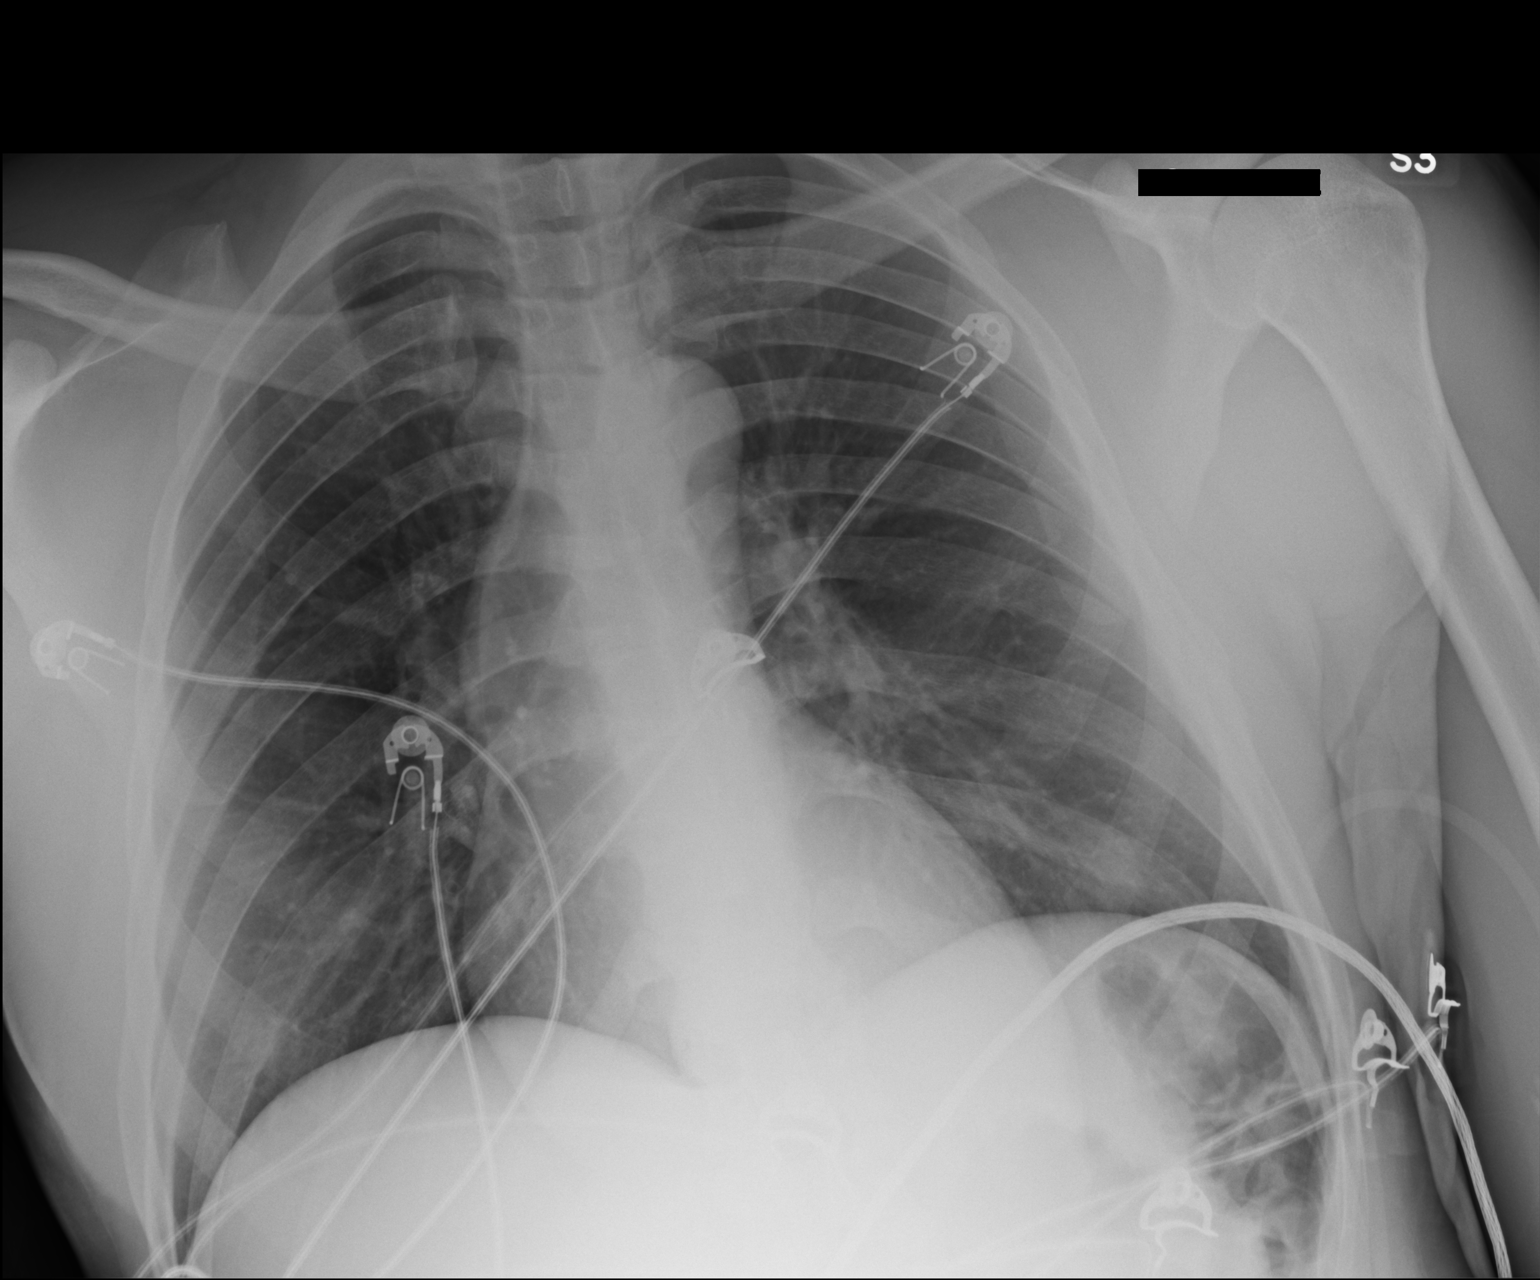

[1 of 1 positions shown; findings below may reference images not displayed]

FINDINGS: The heart size and mediastinal contours are within normal limits.
Both lungs are clear. The visualized skeletal structures are
unremarkable.
IMPRESSION: No active disease.

## 2016-11-08 ENCOUNTER — Telehealth: Payer: Self-pay | Admitting: Pharmacist

## 2016-11-08 ENCOUNTER — Other Ambulatory Visit: Payer: Self-pay | Admitting: Pharmacist

## 2016-11-08 MED ORDER — ELVITEG-COBIC-EMTRICIT-TENOFAF 150-150-200-10 MG PO TABS: 1.0000 | ORAL_TABLET | Freq: Every day | ORAL | 0 refills | Status: DC
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
## 2016-11-09 ENCOUNTER — Emergency Department (HOSPITAL_COMMUNITY)
Admission: EM | Admit: 2016-11-09 | Discharge: 2016-11-10 | Disposition: A | Discharge status: Other Acute Inpatient Hospital | Payer: Self-pay | Attending: Emergency Medicine | Admitting: Emergency Medicine

## 2016-11-09 ENCOUNTER — Encounter (HOSPITAL_COMMUNITY): Payer: Self-pay | Admitting: *Deleted

## 2016-11-09 DIAGNOSIS — Z79899 Other long term (current) drug therapy: Secondary | ICD-10-CM | POA: Insufficient documentation

## 2016-11-09 DIAGNOSIS — F329 Major depressive disorder, single episode, unspecified: Secondary | ICD-10-CM | POA: Insufficient documentation

## 2016-11-09 DIAGNOSIS — F1010 Alcohol abuse, uncomplicated: Secondary | ICD-10-CM | POA: Insufficient documentation

## 2016-11-09 DIAGNOSIS — F17210 Nicotine dependence, cigarettes, uncomplicated: Secondary | ICD-10-CM | POA: Insufficient documentation

## 2016-11-09 DIAGNOSIS — F1910 Other psychoactive substance abuse, uncomplicated: Secondary | ICD-10-CM | POA: Insufficient documentation

## 2016-11-09 DIAGNOSIS — R45851 Suicidal ideations: Secondary | ICD-10-CM | POA: Insufficient documentation

## 2016-11-09 LAB — COMPREHENSIVE METABOLIC PANEL
ALBUMIN: 3.9 g/dL (ref 3.5–5.0)
ALK PHOS: 60 U/L (ref 38–126)
ALT: 26 U/L (ref 17–63)
ANION GAP: 13 (ref 5–15)
AST: 49 U/L — ABNORMAL HIGH (ref 15–41)
BUN: 7 mg/dL (ref 6–20)
CHLORIDE: 103 mmol/L (ref 101–111)
CO2: 22 mmol/L (ref 22–32)
Calcium: 8.7 mg/dL — ABNORMAL LOW (ref 8.9–10.3)
Creatinine, Ser: 0.82 mg/dL (ref 0.61–1.24)
GFR calc non Af Amer: >60 mL/min (ref >60)
GLUCOSE: 80 mg/dL (ref 65–99)
POTASSIUM: 4.1 mmol/L (ref 3.5–5.1)
SODIUM: 138 mmol/L (ref 135–145)
Total Bilirubin: 0.8 mg/dL (ref 0.3–1.2)
Total Protein: 7.3 g/dL (ref 6.5–8.1)

## 2016-11-09 LAB — RAPID URINE DRUG SCREEN, HOSP PERFORMED
Amphetamines: NOT DETECTED
BARBITURATES: NOT DETECTED
BENZODIAZEPINES: NOT DETECTED
COCAINE: POSITIVE — AB
OPIATES: NOT DETECTED
TETRAHYDROCANNABINOL: NOT DETECTED

## 2016-11-09 LAB — CBC
HEMATOCRIT: 42.7 % (ref 39.0–52.0)
HEMOGLOBIN: 14.8 g/dL (ref 13.0–17.0)
MCH: 29 pg (ref 26.0–34.0)
MCHC: 34.7 g/dL (ref 30.0–36.0)
MCV: 83.7 fL (ref 78.0–100.0)
Platelets: 241 10*3/uL (ref 150–400)
RBC: 5.1 MIL/uL (ref 4.22–5.81)
RDW: 15.3 % (ref 11.5–15.5)
WBC: 8.2 10*3/uL (ref 4.0–10.5)

## 2016-11-09 LAB — ETHANOL: Alcohol, Ethyl (B): 97 mg/dL — ABNORMAL HIGH (ref <5)

## 2016-11-09 LAB — RAPID HIV SCREEN (HIV 1/2 AB+AG)
HIV 1/2 Antibodies: NONREACTIVE
HIV-1 P24 ANTIGEN - HIV24: NONREACTIVE

## 2016-11-09 LAB — SALICYLATE LEVEL

## 2016-11-09 LAB — ACETAMINOPHEN LEVEL

## 2016-11-09 MED ORDER — ELVITEG-COBIC-EMTRICIT-TENOFAF 150-150-200-10 MG PO TABS
1.0000 | ORAL_TABLET | Freq: Once | ORAL | Status: AC
  Administered 2016-11-09: 1 via ORAL
  Filled 2016-11-09: qty 1

## 2016-11-09 MED ORDER — NICOTINE 21 MG/24HR TD PT24: 21.0000 mg | MEDICATED_PATCH | Freq: Every day | TRANSDERMAL | Status: DC | PRN

## 2016-11-09 MED ORDER — LORAZEPAM 1 MG PO TABS: 0.0000 mg | ORAL_TABLET | Freq: Four times a day (QID) | ORAL | Status: DC

## 2016-11-09 MED ORDER — LORAZEPAM 1 MG PO TABS: 0.0000 mg | ORAL_TABLET | Freq: Two times a day (BID) | ORAL | Status: DC

## 2016-11-09 MED ORDER — VITAMIN B-1 100 MG PO TABS
100.0000 mg | ORAL_TABLET | Freq: Every day | ORAL | Status: DC
  Administered 2016-11-10: 100 mg via ORAL
  Filled 2016-11-09: qty 1

## 2016-11-09 MED ORDER — ELVITEG-COBIC-EMTRICIT-TENOFAF 150-150-200-10 MG PO TABS
1.0000 | ORAL_TABLET | Freq: Every day | ORAL | Status: DC
  Administered 2016-11-10: 1 via ORAL
  Filled 2016-11-09: qty 1

## 2016-11-09 MED ORDER — IBUPROFEN 400 MG PO TABS: 600.0000 mg | ORAL_TABLET | Freq: Three times a day (TID) | ORAL | Status: DC | PRN

## 2016-11-09 MED ORDER — THIAMINE HCL 100 MG/ML IJ SOLN: 100.0000 mg | Freq: Every day | INTRAMUSCULAR | Status: DC

## 2016-11-09 MED ORDER — LORAZEPAM 2 MG/ML IJ SOLN
0.0000 mg | Freq: Four times a day (QID) | INTRAMUSCULAR | Status: DC
Start: 2016-11-09 — End: 2016-11-10

## 2016-11-09 MED ORDER — LORAZEPAM 2 MG/ML IJ SOLN: 0.0000 mg | Freq: Two times a day (BID) | INTRAMUSCULAR | Status: DC

## 2016-11-09 NOTE — ED Notes (Signed)
Pod E providers to see pt
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
## 2016-11-09 NOTE — ED Notes (Signed)
Patient was given a snack and drink  And a Regular Diet was taken for Dinner.

## 2016-11-09 NOTE — ED Notes (Signed)
Patient changed into scrubs, wanded, and labs collected

## 2016-11-09 NOTE — ED Notes (Addendum)
Breakfast tray ordered 

## 2016-11-09 NOTE — ED Notes (Signed)
Regular Diet was ordered for Lunch. 

## 2016-11-09 NOTE — ED Triage Notes (Signed)
States he is homeless and is trying to kill himself by smoking lots of cocaine. States he smoked tonight.

## 2016-11-09 NOTE — ED Notes (Signed)
TTS in process 

## 2016-11-09 NOTE — Progress Notes (Signed)
CSW reviewed pt chart. Per [name], NP, pt meets criteria for inpatient hospitalization.  Pt referral packet sent to the following hospitals:  [facility], [facility], [facility], [address], [facility], [facility], [facility], [facility], [facility], [place]  Disposition:  CSW will continue to follow for placement.  Timmothy Euler. Kaylyn Lim, MSW, LCSWA Disposition Clinical Social Work [phone] (cell) [phone] (office)
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
## 2016-11-09 NOTE — ED Notes (Signed)
Patient belongings inventory and placed in [place] [facility] with 1 bag, and Valuables taken to Security.

## 2016-11-09 NOTE — ED Notes (Signed)
Sitter at bedside.
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
## 2016-11-09 NOTE — ED Notes (Addendum)
Staffing notified of need for sitter.  

## 2016-11-10 ENCOUNTER — Inpatient Hospital Stay (HOSPITAL_COMMUNITY)
Admission: AD | Admit: 2016-11-10 | Discharge: 2016-11-13 | DRG: 897 | Disposition: A | Discharge status: Home / Self Care | Payer: Federal, State, Local not specified - Other | Source: Intra-hospital | Attending: Psychiatry | Admitting: Psychiatry

## 2016-11-10 ENCOUNTER — Encounter (HOSPITAL_COMMUNITY): Payer: Self-pay | Admitting: *Deleted

## 2016-11-10 DIAGNOSIS — G4700 Insomnia, unspecified: Secondary | ICD-10-CM | POA: Diagnosis present

## 2016-11-10 DIAGNOSIS — Z590 Homelessness: Secondary | ICD-10-CM

## 2016-11-10 DIAGNOSIS — Z638 Other specified problems related to primary support group: Secondary | ICD-10-CM | POA: Diagnosis not present

## 2016-11-10 DIAGNOSIS — F419 Anxiety disorder, unspecified: Secondary | ICD-10-CM | POA: Diagnosis present

## 2016-11-10 DIAGNOSIS — Y904 Blood alcohol level of 80-99 mg/100 ml: Secondary | ICD-10-CM | POA: Diagnosis present

## 2016-11-10 DIAGNOSIS — R45851 Suicidal ideations: Secondary | ICD-10-CM | POA: Diagnosis present

## 2016-11-10 DIAGNOSIS — F1994 Other psychoactive substance use, unspecified with psychoactive substance-induced mood disorder: Principal | ICD-10-CM | POA: Diagnosis present

## 2016-11-10 DIAGNOSIS — Z91410 Personal history of adult physical and sexual abuse: Secondary | ICD-10-CM | POA: Diagnosis not present

## 2016-11-10 DIAGNOSIS — F332 Major depressive disorder, recurrent severe without psychotic features: Secondary | ICD-10-CM | POA: Diagnosis not present

## 2016-11-10 DIAGNOSIS — I10 Essential (primary) hypertension: Secondary | ICD-10-CM | POA: Diagnosis present

## 2016-11-10 DIAGNOSIS — Z79899 Other long term (current) drug therapy: Secondary | ICD-10-CM | POA: Diagnosis not present

## 2016-11-10 DIAGNOSIS — F329 Major depressive disorder, single episode, unspecified: Secondary | ICD-10-CM | POA: Diagnosis present

## 2016-11-10 DIAGNOSIS — F1490 Cocaine use, unspecified, uncomplicated: Secondary | ICD-10-CM | POA: Diagnosis not present

## 2016-11-10 DIAGNOSIS — F333 Major depressive disorder, recurrent, severe with psychotic symptoms: Secondary | ICD-10-CM | POA: Diagnosis present

## 2016-11-10 DIAGNOSIS — Z560 Unemployment, unspecified: Secondary | ICD-10-CM | POA: Diagnosis not present

## 2016-11-10 DIAGNOSIS — F1010 Alcohol abuse, uncomplicated: Secondary | ICD-10-CM | POA: Diagnosis present

## 2016-11-10 DIAGNOSIS — F1099 Alcohol use, unspecified with unspecified alcohol-induced disorder: Secondary | ICD-10-CM | POA: Diagnosis not present

## 2016-11-10 DIAGNOSIS — F17210 Nicotine dependence, cigarettes, uncomplicated: Secondary | ICD-10-CM | POA: Diagnosis present

## 2016-11-10 HISTORY — DX: Essential (primary) hypertension: I10

## 2016-11-10 LAB — RPR: RPR Ser Ql: NONREACTIVE

## 2016-11-10 MED ORDER — LORAZEPAM 1 MG PO TABS
1.0000 mg | ORAL_TABLET | Freq: Three times a day (TID) | ORAL | Status: DC
  Administered 2016-11-12: 1 mg via ORAL
  Filled 2016-11-10: qty 1

## 2016-11-10 MED ORDER — ELVITEG-COBIC-EMTRICIT-TENOFAF 150-150-200-10 MG PO TABS
1.0000 | ORAL_TABLET | Freq: Every day | ORAL | Status: DC
  Administered 2016-11-11 – 2016-11-12 (×2): 1 via ORAL
  Filled 2016-11-10: qty 1
  Filled 2016-11-10 (×2): qty 7
  Filled 2016-11-10 (×3): qty 1

## 2016-11-10 MED ORDER — HYDROXYZINE HCL 25 MG PO TABS
25.0000 mg | ORAL_TABLET | Freq: Three times a day (TID) | ORAL | Status: DC | PRN
  Filled 2016-11-10: qty 10

## 2016-11-10 MED ORDER — HYDROXYZINE HCL 25 MG PO TABS
25.0000 mg | ORAL_TABLET | Freq: Four times a day (QID) | ORAL | Status: DC | PRN
  Filled 2016-11-10: qty 1

## 2016-11-10 MED ORDER — MAGNESIUM HYDROXIDE 400 MG/5ML PO SUSP: 30.0000 mL | Freq: Every day | ORAL | Status: DC | PRN

## 2016-11-10 MED ORDER — LORAZEPAM 1 MG PO TABS
1.0000 mg | ORAL_TABLET | Freq: Four times a day (QID) | ORAL | Status: AC
  Administered 2016-11-10 – 2016-11-12 (×6): 1 mg via ORAL
  Filled 2016-11-10 (×5): qty 1

## 2016-11-10 MED ORDER — LOPERAMIDE HCL 2 MG PO CAPS: 2.0000 mg | ORAL_CAPSULE | ORAL | Status: DC | PRN

## 2016-11-10 MED ORDER — LORAZEPAM 1 MG PO TABS: 1.0000 mg | ORAL_TABLET | Freq: Every day | ORAL | Status: DC

## 2016-11-10 MED ORDER — LORAZEPAM 1 MG PO TABS
1.0000 mg | ORAL_TABLET | Freq: Four times a day (QID) | ORAL | Status: DC | PRN
  Filled 2016-11-10: qty 1

## 2016-11-10 MED ORDER — ADULT MULTIVITAMIN W/MINERALS CH
1.0000 | ORAL_TABLET | Freq: Every day | ORAL | Status: DC
  Administered 2016-11-10 – 2016-11-13 (×4): 1 via ORAL
  Filled 2016-11-10 (×8): qty 1

## 2016-11-10 MED ORDER — ONDANSETRON 4 MG PO TBDP: 4.0000 mg | ORAL_TABLET | Freq: Four times a day (QID) | ORAL | Status: DC | PRN

## 2016-11-10 MED ORDER — ALUM & MAG HYDROXIDE-SIMETH 200-200-20 MG/5ML PO SUSP: 30.0000 mL | ORAL | Status: DC | PRN

## 2016-11-10 MED ORDER — ACETAMINOPHEN 325 MG PO TABS: 650.0000 mg | ORAL_TABLET | Freq: Four times a day (QID) | ORAL | Status: DC | PRN

## 2016-11-10 MED ORDER — VITAMIN B-1 100 MG PO TABS
100.0000 mg | ORAL_TABLET | Freq: Every day | ORAL | Status: DC
  Administered 2016-11-11 – 2016-11-13 (×3): 100 mg via ORAL
  Filled 2016-11-10 (×6): qty 1

## 2016-11-10 MED ORDER — LORAZEPAM 1 MG PO TABS: 1.0000 mg | ORAL_TABLET | Freq: Two times a day (BID) | ORAL | Status: DC

## 2016-11-10 MED ORDER — TRAZODONE HCL 50 MG PO TABS
50.0000 mg | ORAL_TABLET | Freq: Every evening | ORAL | Status: DC | PRN
  Administered 2016-11-12: 50 mg via ORAL
  Filled 2016-11-10 (×2): qty 1
  Filled 2016-11-10: qty 7

## 2016-11-10 NOTE — Progress Notes (Signed)
Per Malva Limes , [facility], patient has been accepted to [facility], bed 304-2 ; Accepting provider is Hillery Jacks, NP; Attending provider is Dr. Jama Flavors.   Patient can arrive between 12:30pm - 1:00pm. Number for report is [phone].    Malva Limes, [facility] stated that she notified patient's nurse, Tresa Endo, RN.  Baldo Daub MSW, LCSWA CSW Disposition [phone]

## 2016-11-10 NOTE — ED Notes (Signed)
Pt states he would like to hold off on receiving ativan at this time and will let this rn know if he feels he needs it.

## 2016-11-10 NOTE — Progress Notes (Signed)
  DATA ACTION RESPONSE  Objective- Pt. is visible in the dayroom, seen eating a snack and interacting with peers. Presents with an animated/anxious affect and mood. Pt was hoping to get into a long term treatment facility like [facility] or somewhere in [place]. No further c/o. Pt did not endorse any withdrawal s/s.   Subjective- Denies having any SI/HI/AVH/Pain at this time. Is cooperative and remain safe on the unit.  1:1 interaction in private to establish rapport. Encouragement, education, & support given from staff.     Safety maintained with Q 15 checks. Continue with POC.
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
## 2016-11-10 NOTE — ED Notes (Signed)
Patient transported to [facility] via [facility]. 

## 2016-11-10 NOTE — Tx Team (Signed)
Initial Treatment Plan 11/10/2016 2:20 PM Adam [name] [id number]    PATIENT STRESSORS: Substance abuse   PATIENT STRENGTHS: Other: none noted   PATIENT IDENTIFIED PROBLEMS: Suicidal Ideation  Anxiety                   DISCHARGE CRITERIA:  Adequate post-discharge living arrangements Improved stabilization in mood, thinking, and/or behavior  PRELIMINARY DISCHARGE PLAN: Outpatient therapy Placement in alternative living arrangements  PATIENT/FAMILY INVOLVEMENT: This treatment plan has been presented to and reviewed with the patient, Adam [name], and/or family member, pt.  The patient and family have been given the opportunity to ask questions and make suggestions.  Arsenio LoaderHiatt, Adyen Bifulco Dudley, RN 11/10/2016, 2:20 PM

## 2016-11-10 NOTE — Plan of Care (Signed)
Problem: Medication: Goal: Compliance with prescribed medication regimen will improve Outcome: Progressing Pt. take meds. as prescribed.    

## 2016-11-10 NOTE — [facility] Counselor (Signed)
Pt is a 31 year old male who [facility] with complaint of suicidal ideation with plan to overdose or stab himself.  Pt also reported hallucination when using substances such as Molly.  Pt was re-assessed this AM.  He reported that he is still suicidal, and if discharged, would harm himself.  Recommend continued inpatient.

## 2016-11-11 DIAGNOSIS — F1099 Alcohol use, unspecified with unspecified alcohol-induced disorder: Secondary | ICD-10-CM

## 2016-11-11 DIAGNOSIS — F1994 Other psychoactive substance use, unspecified with psychoactive substance-induced mood disorder: Principal | ICD-10-CM

## 2016-11-11 DIAGNOSIS — F1490 Cocaine use, unspecified, uncomplicated: Secondary | ICD-10-CM

## 2016-11-11 DIAGNOSIS — Z590 Homelessness: Secondary | ICD-10-CM

## 2016-11-11 DIAGNOSIS — Z560 Unemployment, unspecified: Secondary | ICD-10-CM

## 2016-11-11 DIAGNOSIS — F332 Major depressive disorder, recurrent severe without psychotic features: Secondary | ICD-10-CM

## 2016-11-11 DIAGNOSIS — R45851 Suicidal ideations: Secondary | ICD-10-CM

## 2016-11-11 DIAGNOSIS — T7621XA Adult sexual abuse, suspected, initial encounter: Secondary | ICD-10-CM

## 2016-11-11 MED ORDER — ENSURE ENLIVE PO LIQD
237.0000 mL | Freq: Two times a day (BID) | ORAL | Status: DC
  Administered 2016-11-11 – 2016-11-13 (×5): 237 mL via ORAL

## 2016-11-11 NOTE — [facility] Suicide Risk Assessment (Signed)
[facility] INPATIENT:  Family/Significant Other Suicide Prevention Education  Suicide Prevention Education:  Patient Refusal for Family/Significant Other Suicide Prevention Education: The patient Adam [name] has refused to provide written consent for family/significant other to be provided Family/Significant Other Suicide Prevention Education during admission and/or prior to discharge.  Physician notified.  Verdene LennertLauren C Aritza Brunet 11/11/2016, 4:47 PM

## 2016-11-11 NOTE — Progress Notes (Signed)
NUTRITION ASSESSMENT  Pt identified as at risk on the Malnutrition Screen Tool  INTERVENTION: 1. Supplements: Ensure Enlive po BID, each supplement provides 350 kcal and 20 grams of protein  NUTRITION DIAGNOSIS: Unintentional weight loss related to sub-optimal intake as evidenced by pt report.   Goal: Pt to meet >/= 90% of their estimated nutrition needs.  Monitor:  PO intake  Assessment:  Pt admitted with polysubstance abuse. Recently released from prison in July. Pt has since been homeless and not eating as consistently. Was eating 75-100% in [facility] ED. Suspect environmental factors and substance abuse affect patient's intake. Will place order for Ensure.  Per chart review, pt has lost 34 lb since 8/9 (16% wt loss x 20 days, significant for time frame).   Height: Ht Readings from Last 1 Encounters:  11/10/16 5' 9.88" (1.775 m)    Weight: Wt Readings from Last 1 Encounters:  11/10/16 175 lb (79.4 kg)    Weight Hx: Wt Readings from Last 10 Encounters:  11/10/16 175 lb (79.4 kg)  11/09/16 170 lb (77.1 kg)  10/26/15 197 lb 1.5 oz (89.4 kg)  10/23/15 197 lb (89.4 kg)  10/19/15 197 lb (89.4 kg)  10/19/15 197 lb 8 oz (89.6 kg)  10/22/14 209 lb (94.8 kg)  05/18/14 202 lb (91.6 kg)  05/16/14 181 lb (82.1 kg)  05/02/13 181 lb 7 oz (82.3 kg)    BMI:  Body mass index is 25.19 kg/m. Pt meets criteria for normal based on current BMI.  Estimated Nutritional Needs: Kcal: 25-30 kcal/kg Protein: > 1 gram protein/kg Fluid: 1 ml/kcal  Diet Order:   Pt is also offered choice of unit snacks mid-morning and mid-afternoon.  Pt is eating as desired.   Lab results and medications reviewed.   Tilda FrancoLindsey Demarius Archila, MS, RD, LDN Pager: [phone] After Hours Pager: [phone]

## 2016-11-11 NOTE — Telephone Encounter (Signed)
Thanks so much Cassie. Is he going to followup with us re the PEP (and he l ikley needs PrEP)
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
## 2016-11-11 NOTE — Progress Notes (Signed)
D: Patient denies SI, HI or AVH. Patient appears depressed and anxious but is otherwise pleasant.  Pt. Is preoccupied and anxious about receiving some of his test results.  Pt. States that if the results are not good, "that its my fault, I did it to myself".  Pt. Is up and interacting appropriately with staff and others on the unit.  Pt. Attended morning group about leisure and lifestyle changes, participated and was engaged.    A: Patient given emotional support from RN. Patient encouraged to come to staff with concerns and/or questions. Patient's medication routine continued. Patient's orders and plan of care reviewed.   R: Patient remains appropriate and cooperative. Will continue to monitor patient q15 minutes for safety.
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
## 2016-11-11 NOTE — BHH Group Notes (Signed)
LCSW Group Therapy Note  11/11/2016 1:15pm  Type of Therapy/Topic:  Group Therapy:  Balance in Life  Participation Level:  Minimal  Description of Group:    This group will address the concept of balance and how it feels and looks when one is unbalanced. Patients will be encouraged to process areas in their lives that are out of balance and identify reasons for remaining unbalanced. Facilitators will guide patients in utilizing problem-solving interventions to address and correct the stressor making their life unbalanced. Understanding and applying boundaries will be explored and addressed for obtaining and maintaining a balanced life. Patients will be encouraged to explore ways to assertively make their unbalanced needs known to significant others in their lives, using other group members and facilitator for support and feedback.  Therapeutic Goals: 1. Patient will identify two or more emotions or situations they have that consume much of in their lives. 2. Patient will identify signs/triggers that life has become out of balance:  3. Patient will identify two ways to set boundaries in order to achieve balance in their lives:  4. Patient will demonstrate ability to communicate their needs through discussion and/or role plays  Summary of Patient Progress: Pt attended group and stayed the entire time. He sat quietly and listened to other members share.      Therapeutic Modalities:   Cognitive Behavioral Therapy Solution-Focused Therapy Assertiveness Training  Rondall AllegraCandace L Parthena Fergeson, [place] 11/11/2016 2:33 PM
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
## 2016-11-12 NOTE — BHH Group Notes (Addendum)
LCSW Group Therapy Note  11/12/2016 1:15pm  Type of Therapy and Topic:  Group Therapy:  Feelings around Relapse and Recovery  Participation Level:  Minimal   Description of Group:    Patients in this group will discuss emotions they experience before and after a relapse. They will process how experiencing these feelings, or avoidance of experiencing them, relates to having a relapse. Facilitator will guide patients to explore emotions they have related to recovery. Patients will be encouraged to process which emotions are more powerful. They will be guided to discuss the emotional reaction significant others in their lives may have to their relapse or recovery. Patients will be assisted in exploring ways to respond to the emotions of others without this contributing to a relapse.  Therapeutic Goals: 1. Patient will identify two or more emotions that lead to a relapse for them 2. Patient will identify two emotions that result when they relapse 3. Patient will identify two emotions related to recovery 4. Patient will demonstrate ability to communicate their needs through discussion and/or role plays   Summary of Patient Progress:  Patient attended group in it's entirety. He did not actively participate in group discussion but was attentive while others shared.    Therapeutic Modalities:   Cognitive Behavioral Therapy Solution-Focused Therapy Assertiveness Training Relapse Prevention Therapy   Ledell PeoplesHeather N Smart, LCSW 11/12/2016 3:00 PM

## 2016-11-12 NOTE — Progress Notes (Signed)
Message left for Tomasa BlaseReggie Horton (admissions coordinator) [phone] at [facility] [place]. CSW requested call back with information about admissions/waitlist and bed availability per patient request.  Trula SladeHeather Smart, MSW, LCSW Clinical Social Worker 11/12/2016 8:22 AM

## 2016-11-12 NOTE — Progress Notes (Signed)
D-  Patients presents with irritable mood and affect. Pt is very focus on discharged insist he's not suicidal. " Where did you get that idea from, I want to go to [place] to a program Pt educated numerous times he would be discharge tomorrow. Goal for today is prepare discharge.   A- Support and Encouragement provided, Allowed patient to ventilate during 1:1.  R- Will continue to monitor on q 15 minute checks for safety, compliant with medications and programing

## 2016-11-12 NOTE — Progress Notes (Signed)
Recreation Therapy Notes  Date: 11/12/16 Time: 0930 Location: [address] Dayroom  Group Topic: Stress Management  Goal Area(s) Addresses:  Patient will verbalize importance of using healthy stress management.  Patient will identify positive emotions associated with healthy stress management.   Behavioral Response: Engaged  Intervention: Stress Management  Activity :  Progressive Muscle Relaxation.  LRT introduced the stress management technique of progressive muscle relaxation.  Patients were to follow along as LRT read script to fully engage in the technique.  Education:  Stress Management, Discharge Planning.   Education Outcome: Acknowledges edcuation/In group clarification offered/Needs additional education  Clinical Observations/Feedback: Pt attended group.   Adam [name], LRT/CTRS         Adam [name] 11/12/2016 12:05 PM 

## 2016-11-12 NOTE — Progress Notes (Signed)
  [facility] Discharge Plan :  Will you be returning to the same living situation after discharge:  No. [place] (to stay with a friend until entering program).  At discharge, do you have transportation home?: Yes,  PART and bus passes provided and in pt chart. Pt scheduled for discharge for Saturday at 11am. (Per Dr. Jackquline BerlinIzediuno).  Do you have the ability to pay for your medications: Yes,  mental health  Release of information consent forms completed and submitted to medical records by CSW.  Patient to Follow up at: Follow-up Information    [facility] Follow up on 11/16/2016.   Why:  Go to [facility] during Walk in Hours on Tuesday 11/16/16 for assessment (medication management and counseling). Walk in hours: 9am-2pm. Thank you.  Contact information: [address] [place], [place] [address] Phone: [phone] Fax: [phone]          Next level of care provider has access to [facility] Link:no  Safety Planning and Suicide Prevention discussed: Yes,  SPE completed with pt; pt declined to consent to family contact. SPI pamphlet and Mobile Crisis information provided.  Have you used any form of tobacco in the last 30 days? (Cigarettes, Smokeless Tobacco, Cigars, and/or Pipes): Patient Refused Screening  Has patient been referred to the Quitline?: Patient refused referral  Patient has been referred for addiction treatment: Yes  Pulte HomesHeather N Smart, LCSW 11/12/2016, 12:03 PM

## 2016-11-12 NOTE — Progress Notes (Signed)
Patient ID: Adam [name], male   DOB: 1985/12/25, [age] y.o.   MRN: [id number]  D: Patient observed watching TV and interacting well with peers on approach. Pt mood/affect appears anxious. Pt reports he had a good day and looking forward to getting his life together. Pt reports he is homeless and will be going to the [facility]. Pt attended evening AA group and engaged in conversations. Denies SI/HI/AVH and pain.No behavioral issues noted.  A: Support and encouragement offered as needed to express needs. Medications administered as prescribed.  R: Patient is safe and cooperative on unit. Will continue to monitor  for safety and stability.

## 2016-11-12 NOTE — Tx Team (Signed)
Interdisciplinary Treatment and Diagnostic Plan Update  11/12/2016 Time of Session: 0830AM Adam [name] MRN: [id number]  Principal Diagnosis: Substance induced mood disorder (HCC)  Secondary Diagnoses: Principal Problem:   Substance induced mood disorder (HCC) Active Problems:   MDD (major depressive disorder)   Current Medications:  Current Facility-Administered Medications  Medication Dose Route Frequency Provider Last Rate Last Dose  . acetaminophen (TYLENOL) tablet 650 mg  650 mg Oral Q6H PRN Oneta Rack, NP      . alum & mag hydroxide-simeth (MAALOX/MYLANTA) 200-200-20 MG/5ML suspension 30 mL  30 mL Oral Q4H PRN Oneta Rack, NP      . elvitegravir-cobicistat-emtricitabine-tenofovir (GENVOYA) 150-150-200-10 MG tablet 1 tablet  1 tablet Oral Q breakfast Cobos, Rockey Situ, MD   1 tablet at 11/11/16 0759  . feeding supplement (ENSURE ENLIVE) (ENSURE ENLIVE) liquid 237 mL  237 mL Oral BID BM Cobos, Rockey Situ, MD   237 mL at 11/11/16 1641  . [START ON 11/13/2016] hydrOXYzine (ATARAX/VISTARIL) tablet 25 mg  25 mg Oral TID PRN Oneta Rack, NP      . hydrOXYzine (ATARAX/VISTARIL) tablet 25 mg  25 mg Oral Q6H PRN Oneta Rack, NP      . loperamide (IMODIUM) capsule 2-4 mg  2-4 mg Oral PRN Oneta Rack, NP      . LORazepam (ATIVAN) tablet 1 mg  1 mg Oral Q6H PRN Oneta Rack, NP      . LORazepam (ATIVAN) tablet 1 mg  1 mg Oral QID Oneta Rack, NP   1 mg at 11/12/16 0122   Followed by  . LORazepam (ATIVAN) tablet 1 mg  1 mg Oral TID Oneta Rack, NP       Followed by  . [START ON 11/13/2016] LORazepam (ATIVAN) tablet 1 mg  1 mg Oral BID Oneta Rack, NP       Followed by  . [START ON 11/15/2016] LORazepam (ATIVAN) tablet 1 mg  1 mg Oral Daily Lewis, Tanika N, NP      . magnesium hydroxide (MILK OF MAGNESIA) suspension 30 mL  30 mL Oral Daily PRN Oneta Rack, NP      . multivitamin with minerals tablet 1 tablet  1 tablet Oral Daily Oneta Rack, NP   1 tablet  at 11/11/16 0757  . ondansetron (ZOFRAN-ODT) disintegrating tablet 4 mg  4 mg Oral Q6H PRN Oneta Rack, NP      . thiamine (VITAMIN B-1) tablet 100 mg  100 mg Oral Daily Oneta Rack, NP   100 mg at 11/11/16 0758  . traZODone (DESYREL) tablet 50 mg  50 mg Oral QHS PRN Oneta Rack, NP       PTA Medications: Prescriptions Prior to Admission  Medication Sig Dispense Refill Last Dose  . amLODipine (NORVASC) 10 MG tablet Take 1 tablet (10 mg total) by mouth daily. (Patient not taking: Reported on 11/09/2016)   Not Taking at Unknown time  . cloNIDine (CATAPRES) 0.1 MG tablet Take 1 tablet (0.1 mg total) by mouth 2 (two) times daily. (Patient not taking: Reported on 11/09/2016) 60 tablet 11 Not Taking at Unknown time  . elvitegravir-cobicistat-emtricitabine-tenofovir (GENVOYA) 150-150-200-10 MG TABS tablet Take 1 tablet by mouth daily with breakfast. For PEP. 28 tablet 0 11/08/2016  . loratadine (CLARITIN) 10 MG tablet Take 1 tablet (10 mg total) by mouth daily. (Patient not taking: Reported on 11/09/2016)   Not Taking at Unknown time  . traZODone (DESYREL) 100 MG  tablet Take 1 tablet (100 mg total) by mouth at bedtime as needed for sleep. (Patient not taking: Reported on 11/09/2016) 30 tablet 0 Not Taking at Unknown time    Patient Stressors: Substance abuse  Patient Strengths: Other: none noted  Treatment Modalities: Medication Management, Group therapy, Case management,  1 to 1 session with clinician, Psychoeducation, Recreational therapy.   Physician Treatment Plan for Primary Diagnosis: Substance induced mood disorder (HCC) Long Term Goal(s): Improvement in symptoms so as ready for discharge Improvement in symptoms so as ready for discharge   Short Term Goals: Ability to identify changes in lifestyle to reduce recurrence of condition will improve Ability to verbalize feelings will improve Ability to disclose and discuss suicidal ideas Ability to demonstrate self-control will  improve Ability to identify and develop effective coping behaviors will improve Ability to maintain clinical measurements within normal limits will improve Compliance with prescribed medications will improve Ability to identify triggers associated with substance abuse/mental health issues will improve Ability to identify changes in lifestyle to reduce recurrence of condition will improve Ability to verbalize feelings will improve Ability to disclose and discuss suicidal ideas Ability to demonstrate self-control will improve Ability to identify and develop effective coping behaviors will improve Ability to maintain clinical measurements within normal limits will improve Compliance with prescribed medications will improve Ability to identify triggers associated with substance abuse/mental health issues will improve  Medication Management: Evaluate patient's response, side effects, and tolerance of medication regimen.  Therapeutic Interventions: 1 to 1 sessions, Unit Group sessions and Medication administration.  Evaluation of Outcomes: Progressing  Physician Treatment Plan for Secondary Diagnosis: Principal Problem:   Substance induced mood disorder (HCC) Active Problems:   MDD (major depressive disorder)  Long Term Goal(s): Improvement in symptoms so as ready for discharge Improvement in symptoms so as ready for discharge   Short Term Goals: Ability to identify changes in lifestyle to reduce recurrence of condition will improve Ability to verbalize feelings will improve Ability to disclose and discuss suicidal ideas Ability to demonstrate self-control will improve Ability to identify and develop effective coping behaviors will improve Ability to maintain clinical measurements within normal limits will improve Compliance with prescribed medications will improve Ability to identify triggers associated with substance abuse/mental health issues will improve Ability to identify changes  in lifestyle to reduce recurrence of condition will improve Ability to verbalize feelings will improve Ability to disclose and discuss suicidal ideas Ability to demonstrate self-control will improve Ability to identify and develop effective coping behaviors will improve Ability to maintain clinical measurements within normal limits will improve Compliance with prescribed medications will improve Ability to identify triggers associated with substance abuse/mental health issues will improve     Medication Management: Evaluate patient's response, side effects, and tolerance of medication regimen.  Therapeutic Interventions: 1 to 1 sessions, Unit Group sessions and Medication administration.  Evaluation of Outcomes: Progressing   RN Treatment Plan for Primary Diagnosis: Substance induced mood disorder (HCC) Long Term Goal(s): Knowledge of disease and therapeutic regimen to maintain health will improve  Short Term Goals: Ability to remain free from injury will improve, Ability to participate in decision making will improve and Ability to verbalize feelings will improve  Medication Management: RN will administer medications as ordered by provider, will assess and evaluate patient's response and provide education to patient for prescribed medication. RN will report any adverse and/or side effects to prescribing provider.  Therapeutic Interventions: 1 on 1 counseling sessions, Psychoeducation, Medication administration, Evaluate responses to treatment, Monitor  vital signs and CBGs as ordered, Perform/monitor CIWA, COWS, AIMS and Fall Risk screenings as ordered, Perform wound care treatments as ordered.  Evaluation of Outcomes: Progressing   LCSW Treatment Plan for Primary Diagnosis: Substance induced mood disorder (HCC) Long Term Goal(s): Safe transition to appropriate next level of care at discharge, Engage patient in therapeutic group addressing interpersonal concerns.  Short Term Goals:  Engage patient in aftercare planning with referrals and resources, Facilitate patient progression through stages of change regarding substance use diagnoses and concerns and Identify triggers associated with mental health/substance abuse issues  Therapeutic Interventions: Assess for all discharge needs, 1 to 1 time with Social worker, Explore available resources and support systems, Assess for adequacy in community support network, Educate family and significant other(s) on suicide prevention, Complete Psychosocial Assessment, Interpersonal group therapy.  Evaluation of Outcomes: Progressing   Progress in Treatment: Attending groups: No. New to unit. Continuing to assess.  Participating in groups: No. Taking medication as prescribed: Yes. Toleration medication: Yes. Family/Significant other contact made: SPE completed with pt; pt declined to consent to family contact.  Patient understands diagnosis: Yes. Discussing patient identified problems/goals with staff: Yes. Medical problems stabilized or resolved: Yes. Denies suicidal/homicidal ideation: Yes. Issues/concerns per patient self-inventory: No. Other: n/a   New problem(s) identified: No, Describe:  n/a  New Short Term/Long Term Goal(s): elimination of SI thoughts; detox, medication management for mood stabilization.   Patient Goal: "to get linked up with services in [facility] and to stay sober."   Discharge Plan or Barriers: Pt plans to take part bus to [facility] and stay with a friend until he is able to enter [facility] transitional housing program. He plans to follow-up  At [facility] for outpatient mental health services.   Reason for Continuation of Hospitalization: Anxiety Depression Medication stabilization Suicidal ideation Withdrawal symptoms  Estimated Length of Stay: Saturday 11/13/16 at 11am.   Attendees: Patient: 11/12/2016 8:58 AM  Physician: Dr. Jackquline Berlin MD 11/12/2016 8:58 AM  Nursing: Lupita Leash RN; Dan RN  11/12/2016 8:58 AM  RN Care Manager: Onnie Boer CM 11/12/2016 8:58 AM  Social Worker: Trula Slade, LCSW 11/12/2016 8:58 AM  Recreational Therapist: x 11/12/2016 8:58 AM  Other: Armandina Stammer NP; Feliz Beam Money NP 11/12/2016 8:58 AM  Other:  11/12/2016 8:58 AM  Other: 11/12/2016 8:58 AM    Scribe for Treatment Team: Ledell Peoples Smart, LCSW 11/12/2016 8:58 AM

## 2016-11-13 MED ORDER — ELVITEG-COBIC-EMTRICIT-TENOFAF 150-150-200-10 MG PO TABS: 1.0000 | ORAL_TABLET | Freq: Every day | ORAL | 0 refills | Status: AC

## 2016-11-13 MED ORDER — TRAZODONE HCL 50 MG PO TABS: 50.0000 mg | ORAL_TABLET | Freq: Every evening | ORAL | 0 refills | Status: AC | PRN

## 2016-11-13 MED ORDER — HYDROXYZINE HCL 25 MG PO TABS: 25.0000 mg | ORAL_TABLET | Freq: Three times a day (TID) | ORAL | 0 refills | Status: AC | PRN
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
## 2016-11-13 NOTE — [facility] Group Notes (Signed)
LCSW Group Therapy Note  11/13/2016     10:00-11:00AM  Type of Therapy and Topic:  Group Therapy:  Decisional Balance/Substance Use  Participation Level:  Minimal        . Description of Group:  The main focus of today's process group was learning how to use a decisional balance exercise to make a decision about whether to change an unhealthy coping skill, as well as how to use the information gathered in the actual process of planning that change.  Patients listed some of their most frequently utilized unhealthy coping techniques and CSW pointed out the similarities.  Motivational Interviewing and the whiteboard were utilized to help patients explore in-depth the perceived benefits and costs of a specific, shared unhealthy coping technique (drinking & drugging) as well as the benefits and costs of replacing that with other, healthy coping skills.  A handout was distributed for patients to be able to do this exercise for themselves.     Therapeutic Goals 1. Patient will be able to utilize the decision balance exercise on their own 2. Patient will list coping skills they use to fulfill their needs 3. Patient will identify the differences between healthy and  unhealthy coping skills 4. Patient will verbalize the costs and benefits of drinking/drugging versus making the choice to change 5. Patient will learn how to use the exercise to identify the most important supports to put in place so that they can succeed in a change to which they commit  Summary of Patient Progress: During group, patient expressed he isolates a great deal.  He wandered out of group and did not return.   Therapeutic Modalities Cognitive Behavioral Therapy Motivational Interviewing   Lynnell ChadMareida J Grossman-Orr, [place] 11/13/2016 12:50 PM

## 2016-11-13 NOTE — Progress Notes (Signed)
Data. Patient denies SI/HI/AVH. Verbally contracts for safety on the unit and to come to staff before acting of any self harm thoughts/feelings/voices.  Patient interacting well with staff and other patients.  Action. Emotional support and encouragement offered. Education provided on medication, indications and side effect. Q 15 minute checks done for safety. Response. Safety on the unit maintained through 15 minute checks.  Medications taken as prescribed. Attended groups. Remained calm and appropriate through out shift.  Pt. discharged to lobby.  Belongings sheet reviewed and signed by pt. and all belongings, including bus passes to local and part bus, medication samples and scripts,  sent home. Paperwork reviewed and pt. able to verbalize understanding of education. Pt. in no current distress and ambulatory.
